# Patient Record
Sex: Female | Born: 1940 | Race: White | Hispanic: No | Marital: Married | Smoking: Never smoker
Health system: Southern US, Community
[De-identification: ages and names within clinical notes are randomized; demographics above are authoritative.]

## PROBLEM LIST (undated history)

## (undated) DIAGNOSIS — C50919 Malignant neoplasm of unspecified site of unspecified female breast: Secondary | ICD-10-CM

## (undated) DIAGNOSIS — I6529 Occlusion and stenosis of unspecified carotid artery: Secondary | ICD-10-CM

## (undated) DIAGNOSIS — N189 Chronic kidney disease, unspecified: Secondary | ICD-10-CM

## (undated) DIAGNOSIS — I2510 Atherosclerotic heart disease of native coronary artery without angina pectoris: Secondary | ICD-10-CM

## (undated) DIAGNOSIS — E119 Type 2 diabetes mellitus without complications: Secondary | ICD-10-CM

## (undated) DIAGNOSIS — I071 Rheumatic tricuspid insufficiency: Secondary | ICD-10-CM

## (undated) DIAGNOSIS — K219 Gastro-esophageal reflux disease without esophagitis: Secondary | ICD-10-CM

## (undated) DIAGNOSIS — I10 Essential (primary) hypertension: Secondary | ICD-10-CM

## (undated) DIAGNOSIS — M1990 Unspecified osteoarthritis, unspecified site: Secondary | ICD-10-CM

## (undated) DIAGNOSIS — R600 Localized edema: Secondary | ICD-10-CM

## (undated) DIAGNOSIS — E039 Hypothyroidism, unspecified: Secondary | ICD-10-CM

## (undated) DIAGNOSIS — E785 Hyperlipidemia, unspecified: Secondary | ICD-10-CM

## (undated) DIAGNOSIS — M8580 Other specified disorders of bone density and structure, unspecified site: Secondary | ICD-10-CM

## (undated) DIAGNOSIS — H269 Unspecified cataract: Secondary | ICD-10-CM

## (undated) DIAGNOSIS — H409 Unspecified glaucoma: Secondary | ICD-10-CM

## (undated) DIAGNOSIS — D696 Thrombocytopenia, unspecified: Secondary | ICD-10-CM

## (undated) DIAGNOSIS — I340 Nonrheumatic mitral (valve) insufficiency: Secondary | ICD-10-CM

## (undated) HISTORY — DX: Atherosclerotic heart disease of native coronary artery without angina pectoris: I25.10

## (undated) HISTORY — PX: COLONOSCOPY: SHX174

## (undated) HISTORY — PX: BREAST LUMPECTOMY: SHX2

## (undated) HISTORY — PX: CARDIAC CATHETERIZATION: SHX172

## (undated) HISTORY — DX: Type 2 diabetes mellitus without complications: E11.9

## (undated) HISTORY — DX: Essential (primary) hypertension: I10

## (undated) HISTORY — PX: SHOULDER SURGERY: SHX246

## (undated) HISTORY — PX: BLADDER SURGERY: SHX569

## (undated) HISTORY — DX: Thrombocytopenia, unspecified: D69.6

## (undated) HISTORY — DX: Chronic kidney disease, unspecified: N18.9

## (undated) HISTORY — PX: CHOLECYSTECTOMY: SHX55

## (undated) HISTORY — PX: TUBAL LIGATION: SHX77

## (undated) HISTORY — DX: Occlusion and stenosis of unspecified carotid artery: I65.29

## (undated) HISTORY — DX: Unspecified osteoarthritis, unspecified site: M19.90

## (undated) HISTORY — PX: APPENDECTOMY: SHX54

## (undated) HISTORY — PX: BREAST SURGERY: SHX581

## (undated) HISTORY — DX: Unspecified glaucoma: H40.9

## (undated) HISTORY — DX: Other specified disorders of bone density and structure, unspecified site: M85.80

## (undated) HISTORY — DX: Unspecified cataract: H26.9

## (undated) HISTORY — DX: Hyperlipidemia, unspecified: E78.5

## (undated) HISTORY — DX: Gastro-esophageal reflux disease without esophagitis: K21.9

## (undated) HISTORY — PX: OTHER SURGICAL HISTORY: SHX169

## (undated) HISTORY — PX: CARPAL TUNNEL RELEASE: SHX101

---

## 1998-10-14 DIAGNOSIS — Z923 Personal history of irradiation: Secondary | ICD-10-CM

## 1998-10-14 DIAGNOSIS — C50919 Malignant neoplasm of unspecified site of unspecified female breast: Secondary | ICD-10-CM

## 1998-10-14 HISTORY — PX: BREAST EXCISIONAL BIOPSY: SUR124

## 1998-10-14 HISTORY — DX: Malignant neoplasm of unspecified site of unspecified female breast: C50.919

## 1998-10-14 HISTORY — DX: Personal history of irradiation: Z92.3

## 2003-10-28 ENCOUNTER — Other Ambulatory Visit: Payer: Self-pay

## 2004-11-01 ENCOUNTER — Ambulatory Visit: Payer: Self-pay | Admitting: Oncology

## 2005-07-01 ENCOUNTER — Ambulatory Visit: Payer: Self-pay | Admitting: General Surgery

## 2005-10-31 ENCOUNTER — Ambulatory Visit: Payer: Self-pay | Admitting: Oncology

## 2006-04-29 ENCOUNTER — Ambulatory Visit: Payer: Self-pay | Admitting: Oncology

## 2006-05-14 ENCOUNTER — Ambulatory Visit: Payer: Self-pay | Admitting: Oncology

## 2006-07-09 ENCOUNTER — Ambulatory Visit: Payer: Self-pay | Admitting: Family Medicine

## 2007-04-24 ENCOUNTER — Ambulatory Visit: Payer: Self-pay | Admitting: Oncology

## 2007-05-15 ENCOUNTER — Ambulatory Visit: Payer: Self-pay | Admitting: Oncology

## 2007-07-13 ENCOUNTER — Ambulatory Visit: Payer: Self-pay | Admitting: Oncology

## 2007-11-25 ENCOUNTER — Ambulatory Visit: Payer: Self-pay | Admitting: Unknown Physician Specialty

## 2008-04-13 ENCOUNTER — Ambulatory Visit: Payer: Self-pay | Admitting: Oncology

## 2008-04-29 ENCOUNTER — Ambulatory Visit: Payer: Self-pay | Admitting: Oncology

## 2008-05-14 ENCOUNTER — Ambulatory Visit: Payer: Self-pay | Admitting: Oncology

## 2008-07-13 ENCOUNTER — Ambulatory Visit: Payer: Self-pay | Admitting: Oncology

## 2009-04-13 ENCOUNTER — Ambulatory Visit: Payer: Self-pay | Admitting: Oncology

## 2009-04-27 ENCOUNTER — Ambulatory Visit: Payer: Self-pay | Admitting: Oncology

## 2009-07-17 ENCOUNTER — Ambulatory Visit: Payer: Self-pay | Admitting: Oncology

## 2010-04-13 ENCOUNTER — Ambulatory Visit: Payer: Self-pay | Admitting: Oncology

## 2010-04-23 ENCOUNTER — Ambulatory Visit: Payer: Self-pay | Admitting: Oncology

## 2010-05-14 ENCOUNTER — Ambulatory Visit: Payer: Self-pay | Admitting: Oncology

## 2010-07-19 ENCOUNTER — Ambulatory Visit: Payer: Self-pay | Admitting: Oncology

## 2011-05-01 ENCOUNTER — Ambulatory Visit: Payer: Self-pay | Admitting: Ophthalmology

## 2011-05-02 ENCOUNTER — Ambulatory Visit: Payer: Self-pay | Admitting: Oncology

## 2011-05-06 ENCOUNTER — Ambulatory Visit: Payer: Self-pay | Admitting: Ophthalmology

## 2011-05-15 ENCOUNTER — Ambulatory Visit: Payer: Self-pay | Admitting: Oncology

## 2011-06-10 ENCOUNTER — Ambulatory Visit: Payer: Self-pay | Admitting: Ophthalmology

## 2011-07-24 ENCOUNTER — Ambulatory Visit: Payer: Self-pay | Admitting: Oncology

## 2012-01-06 ENCOUNTER — Ambulatory Visit: Payer: Self-pay | Admitting: Family Medicine

## 2012-02-04 ENCOUNTER — Ambulatory Visit: Payer: Self-pay | Admitting: Family Medicine

## 2012-07-27 ENCOUNTER — Ambulatory Visit: Payer: Self-pay | Admitting: Family Medicine

## 2013-02-05 ENCOUNTER — Ambulatory Visit: Payer: Self-pay | Admitting: Unknown Physician Specialty

## 2013-02-09 LAB — PATHOLOGY REPORT

## 2013-06-23 ENCOUNTER — Emergency Department: Payer: Self-pay | Admitting: Emergency Medicine

## 2013-08-17 ENCOUNTER — Ambulatory Visit: Payer: Self-pay | Admitting: Family Medicine

## 2013-10-14 HISTORY — PX: JOINT REPLACEMENT: SHX530

## 2014-03-15 ENCOUNTER — Ambulatory Visit: Payer: Self-pay | Admitting: Family Medicine

## 2014-05-23 ENCOUNTER — Ambulatory Visit: Payer: Self-pay | Admitting: Specialist

## 2014-05-23 LAB — BASIC METABOLIC PANEL
Anion Gap: 8 (ref 7–16)
BUN: 21 mg/dL — ABNORMAL HIGH (ref 7–18)
CALCIUM: 8.3 mg/dL — AB (ref 8.5–10.1)
CO2: 24 mmol/L (ref 21–32)
CREATININE: 1.38 mg/dL — AB (ref 0.60–1.30)
Chloride: 109 mmol/L — ABNORMAL HIGH (ref 98–107)
EGFR (African American): 43 — ABNORMAL LOW
GFR CALC NON AF AMER: 37 — AB
Glucose: 139 mg/dL — ABNORMAL HIGH (ref 65–99)
OSMOLALITY: 286 (ref 275–301)
Potassium: 4 mmol/L (ref 3.5–5.1)
Sodium: 141 mmol/L (ref 136–145)

## 2014-05-23 LAB — URINALYSIS, COMPLETE
Bacteria: NONE SEEN
Bilirubin,UR: NEGATIVE
Blood: NEGATIVE
GLUCOSE, UR: NEGATIVE mg/dL (ref 0–75)
KETONE: NEGATIVE
NITRITE: NEGATIVE
PH: 5 (ref 4.5–8.0)
Specific Gravity: 1.018 (ref 1.003–1.030)
WBC UR: 7 /HPF (ref 0–5)

## 2014-05-23 LAB — CBC
HCT: 41 % (ref 35.0–47.0)
HGB: 13.1 g/dL (ref 12.0–16.0)
MCH: 29.6 pg (ref 26.0–34.0)
MCHC: 32 g/dL (ref 32.0–36.0)
MCV: 93 fL (ref 80–100)
Platelet: 80 x10 3/mm 3 — ABNORMAL LOW (ref 150–440)
RBC: 4.43 X10 6/mm 3 (ref 3.80–5.20)
RDW: 14.7 % — ABNORMAL HIGH (ref 11.5–14.5)
WBC: 5 x10 3/mm 3 (ref 3.6–11.0)

## 2014-05-23 LAB — MRSA PCR SCREENING

## 2014-05-23 LAB — PROTIME-INR
INR: 1.1
Prothrombin Time: 14.5 s (ref 11.5–14.7)

## 2014-05-23 LAB — APTT: Activated PTT: 32.9 s (ref 23.6–35.9)

## 2014-05-31 ENCOUNTER — Inpatient Hospital Stay: Payer: Self-pay | Admitting: Specialist

## 2014-06-01 ENCOUNTER — Encounter: Payer: Self-pay | Admitting: Internal Medicine

## 2014-06-01 LAB — CREATININE, SERUM
CREATININE: 1.38 mg/dL — AB (ref 0.60–1.30)
EGFR (African American): 43 — ABNORMAL LOW
EGFR (Non-African Amer.): 37 — ABNORMAL LOW

## 2014-06-01 LAB — HEMOGLOBIN: HGB: 11.4 g/dL — AB (ref 12.0–16.0)

## 2014-06-02 LAB — BASIC METABOLIC PANEL
Anion Gap: 6 — ABNORMAL LOW (ref 7–16)
BUN: 21 mg/dL — ABNORMAL HIGH (ref 7–18)
CALCIUM: 7.5 mg/dL — AB (ref 8.5–10.1)
Chloride: 108 mmol/L — ABNORMAL HIGH (ref 98–107)
Co2: 28 mmol/L (ref 21–32)
Creatinine: 1.32 mg/dL — ABNORMAL HIGH (ref 0.60–1.30)
EGFR (African American): 46 — ABNORMAL LOW
EGFR (Non-African Amer.): 39 — ABNORMAL LOW
Glucose: 147 mg/dL — ABNORMAL HIGH (ref 65–99)
Osmolality: 289 (ref 275–301)
Potassium: 3.7 mmol/L (ref 3.5–5.1)
Sodium: 142 mmol/L (ref 136–145)

## 2014-06-14 ENCOUNTER — Encounter: Payer: Self-pay | Admitting: Internal Medicine

## 2014-08-17 ENCOUNTER — Ambulatory Visit: Payer: Self-pay | Admitting: Family Medicine

## 2014-10-11 DIAGNOSIS — N183 Chronic kidney disease, stage 3 unspecified: Secondary | ICD-10-CM | POA: Insufficient documentation

## 2014-10-11 DIAGNOSIS — I340 Nonrheumatic mitral (valve) insufficiency: Secondary | ICD-10-CM | POA: Insufficient documentation

## 2014-10-11 DIAGNOSIS — I071 Rheumatic tricuspid insufficiency: Secondary | ICD-10-CM | POA: Insufficient documentation

## 2015-02-01 DIAGNOSIS — I10 Essential (primary) hypertension: Secondary | ICD-10-CM | POA: Insufficient documentation

## 2015-02-04 NOTE — Discharge Summary (Signed)
PATIENT NAME:  Julia Nielsen, Julia Nielsen MR#:  [id number] DATE OF BIRTH:  1940/10/15  DATE OF ADMISSION:  05/31/2014 DATE OF DISCHARGE:  06/03/2014  DISCHARGE DIAGNOSES: 1.  Severe degenerative arthritis, right knee.  2.  Hypothyroidism.  3.  Hypertension.  4.  Diabetes mellitus.  5.  History of breast cancer.  6.  Glaucoma.  7.  Cataracts.   OPERATIONS/PROCEDURES PERFORMED: LCS right total knee arthroplasty on 05/31/2014.   PHYSICAL EXAMINATION: As written on admission.   LABORATORY DATA: As noted in the chart.   COURSE IN HOSPITAL: Following informed consent and complete preoperative medical clearance, the patient was taken to the operating room on 05/31/2014 where right total knee arthroplasty was performed without a problem. The patient tolerated the procedure extremely well. Her Hemovac was removed on the first postoperative day and her dressing was changed. Her wound was seen to be healing in quite well with an intact neurovascular examination. She was placed on Lovenox 30 mg b.i.d. subcutaneously as prophylaxis for venous thrombosis and pulmonary embolism. She progressed well in physical therapy with full weight-bearing as tolerated. She is to be transferred to [facility] today, on 06/03/2014. Her orders are as per the order sheet. She is to have physical therapy for ambulation with a walker and range of motion of her knee as per the usual total knee protocol. Please change her bandage as necessary and keep the staples and wound clean and dry. She is to return to the office in 10 to 12 days to see Dr. Tamala Julian for probable staple removal. ____________________________ Lucas Mallow, MD ces:sb D: 06/03/2014 12:35:39 ET T: 06/03/2014 13:24:06 ET JOB#: [id number]  cc: Lucas Mallow, MD, <Dictator> Lucas Mallow MD ELECTRONICALLY SIGNED 06/08/2014 16:49

## 2015-02-04 NOTE — Op Note (Signed)
PATIENT NAME:  Julia Nielsen, Julia Nielsen MR#:  [id number] DATE OF BIRTH:  23-Apr-1941  DATE OF PROCEDURE:  05/31/2014  PREOPERATIVE DIAGNOSIS: Severe degenerative arthritis, right knee.   POSTOPERATIVE DIAGNOSIS: Severe degenerative arthritis, right knee.   PROCEDURE: LCS right total knee arthroplasty.   SURGEON: Christophe Louis, MD.   ASSISTANT: Thornton Park, MD.   ANESTHESIA: General.   COMPLICATIONS: None.   TOURNIQUET TIME: One hour and 20 minutes.   DESCRIPTION OF PROCEDURE: Ancef  2 grams were given intravenously prior to the procedure. General anesthesia is induced. Foley catheter is inserted. The tourniquet is applied to the right thigh. The right leg is thoroughly prepped with alcohol and ChloraPrep and draped in standard sterile fashion. DuraPrep is placed on the knee anteriorly prior to applying the [place]. The extremity is wrapped out with the Esmarch bandage and pneumatic tourniquet elevated to 375 mmHg. A standard anterior longitudinal incision is made and the dissection carried down to the medial and lateral retinaculum which are dissected out with the periosteal elevator. Medial parapatellar incision is then made and the knee is flexed and the patella is reflected laterally. Standard synovial and osteophyte debridement is performed. Retractors are placed and the tibial cutting block is pinned into place. Alignment rod demonstrates satisfactory alignment with the 2 degree varus cutting block. Proximal tibia is cut and all bone and soft tissue removed. Distal femur is sized as a standard and the central drill hole is made in standard fashion. The 10 mm femoral rotation guide is impacted into place and the long rod demonstrates satisfactory alignment. The cutting block is pinned and the anterior and posterior cuts are made. Flexion block put into place demonstrates good stability and satisfactory alignment. A distal femoral cutting block is then impacted into place and pinned. A distal  femoral cut is then made and all bone and soft tissue debris is removed posteriorly. The 12.5 mm extension block was seen to be the best fit and this was stable to stress testing. Alignment was satisfactory. Femoral shaping guide is then impacted into place and the appropriate holes and cuts made. Proximal tibia is sized as a 2-1/2 and the pins and central drill hole made. Trial components are put into place and are seen to be satisfactory with good alignment and good stability. Patella is prepared in the usual manner for a porous-coated patella. All trial components are then removed. The posterior capsule is then carefully injected with 8 mL of EXPAREL. The joint is then thoroughly irrigated multiple times with the pulsatile lavage. The 2.5 tibial tray is then cemented into place. The 12.5 polyethylene rotating platform is put into position and then the porous-coated standard femoral component is impacted into place with a small amount of cement in the distal drill holes and in the intercondylar notch. The knee is extended and there is seen to be good alignment and stability. The standard patellar press-fit component is then press fitted into place and is seen to be stable. Range of motion with the patella demonstrates adequate stability.  The wound is then thoroughly irrigated multiple times again with the pulsatile lavage. Two Autovac drains were brought out through separate stab wound incisions. The medial retinaculum is repaired with multiple 0 Ethibond sutures. Subcutaneous tissue is closed with 2-0 Vicryl. The skin is closed with the skin stapler. A soft bulky dressing is applied along with a Polar Care and a knee immobilizer. The tourniquet is released. The patient is returned to the recovery room in satisfactory condition, having  tolerated the procedure quite well.   ____________________________ Julia Mallow, MD ces:NT D: 05/31/2014 11:50:14 ET T: 05/31/2014 15:19:15  ET JOB#: [id number]  cc: Julia Mallow, MD, <Dictator> Julia Mallow MD ELECTRONICALLY SIGNED 06/08/2014 16:49

## 2015-02-09 DIAGNOSIS — I6523 Occlusion and stenosis of bilateral carotid arteries: Secondary | ICD-10-CM | POA: Insufficient documentation

## 2015-03-27 ENCOUNTER — Ambulatory Visit (INDEPENDENT_AMBULATORY_CARE_PROVIDER_SITE_OTHER): Payer: Medicare Other | Admitting: Family Medicine

## 2015-03-27 ENCOUNTER — Encounter: Payer: Self-pay | Admitting: Family Medicine

## 2015-03-27 VITALS — BP 150/70 | HR 75 | Temp 97.7°F | Ht 61.1 in | Wt 195.0 lb

## 2015-03-27 DIAGNOSIS — M722 Plantar fascial fibromatosis: Secondary | ICD-10-CM

## 2015-03-27 DIAGNOSIS — I10 Essential (primary) hypertension: Secondary | ICD-10-CM | POA: Diagnosis not present

## 2015-03-27 DIAGNOSIS — E785 Hyperlipidemia, unspecified: Secondary | ICD-10-CM | POA: Diagnosis not present

## 2015-03-27 DIAGNOSIS — E1169 Type 2 diabetes mellitus with other specified complication: Secondary | ICD-10-CM | POA: Insufficient documentation

## 2015-03-27 DIAGNOSIS — E119 Type 2 diabetes mellitus without complications: Secondary | ICD-10-CM | POA: Diagnosis not present

## 2015-03-27 LAB — BAYER DCA HB A1C WAIVED: HB A1C (BAYER DCA - WAIVED): 5.8 % (ref <7.0)

## 2015-03-27 NOTE — Assessment & Plan Note (Signed)
The current medical regimen is effective;  continue present plan and medications.  

## 2015-03-27 NOTE — Patient Instructions (Signed)
Plantar Fasciitis  Plantar fasciitis is a common condition that causes foot pain. It is soreness (inflammation) of the band of tough fibrous tissue on the bottom of the foot that runs from the heel bone (calcaneus) to the ball of the foot. The cause of this soreness may be from excessive standing, poor fitting shoes, running on hard surfaces, being overweight, having an abnormal walk, or overuse (this is common in runners) of the painful foot or feet. It is also common in aerobic exercise dancers and ballet dancers.  SYMPTOMS   Most people with plantar fasciitis complain of:   Severe pain in the morning on the bottom of their foot especially when taking the first steps out of bed. This pain recedes after a few minutes of walking.   Severe pain is experienced also during walking following a long period of inactivity.   Pain is worse when walking barefoot or up stairs  DIAGNOSIS    Your caregiver will diagnose this condition by examining and feeling your foot.   Special tests such as X-rays of your foot, are usually not needed.  PREVENTION    Consult a sports medicine professional before beginning a new exercise program.   Walking programs offer a good workout. With walking there is a lower chance of overuse injuries common to runners. There is less impact and less jarring of the joints.   Begin all new exercise programs slowly. If problems or pain develop, decrease the amount of time or distance until you are at a comfortable level.   Wear good shoes and replace them regularly.   Stretch your foot and the heel cords at the back of the ankle (Achilles tendon) both before and after exercise.   Run or exercise on even surfaces that are not hard. For example, asphalt is better than pavement.   Do not run barefoot on hard surfaces.   If using a treadmill, vary the incline.   Do not continue to workout if you have foot or joint problems. Seek professional help if they do not improve.  HOME CARE INSTRUCTIONS     Avoid activities that cause you pain until you recover.   Use ice or cold packs on the problem or painful areas after working out.   Only take over-the-counter or prescription medicines for pain, discomfort, or fever as directed by your caregiver.   Soft shoe inserts or athletic shoes with air or gel sole cushions may be helpful.   If problems continue or become more severe, consult a sports medicine caregiver or your own health care provider. Cortisone is a potent anti-inflammatory medication that may be injected into the painful area. You can discuss this treatment with your caregiver.  MAKE SURE YOU:    Understand these instructions.   Will watch your condition.   Will get help right away if you are not doing well or get worse.  Document Released: 06/25/2001 Document Revised: 12/23/2011 Document Reviewed: 08/24/2008  ExitCare Patient Information 2015 [employer]. This information is not intended to replace advice given to you by your health care provider. Make sure you discuss any questions you have with your health care provider.

## 2015-03-27 NOTE — Progress Notes (Signed)
   BP 150/70 mmHg  Pulse 75  Temp(Src) 97.7 F (36.5 C)  Ht 5' 1.1" (1.552 m)  Wt 195 lb (88.451 kg)  BMI 36.72 kg/m2  SpO2 98%   Subjective:    Patient ID: [name], female    DOB: 1940/12/09, 74 y.o.   MRN: [id number]  HPI: STEFANIA GOULART is a 74 y.o. female  Chief Complaint  Patient presents with  . Diabetes  . Foot Pain    Right heel   DM doing well no hypoglycemia spells No side effects meds Glu around 100  plantar fascitis sx rt heel  BP stable  Lipids stable  meds doing well no side effects Thyroid stable  Relevant past medical, surgical, family and social history reviewed and updated as indicated. Interim medical history since our last visit reviewed. Allergies and medications reviewed and updated.  Review of Systems  Constitutional: Negative.   Respiratory: Negative.   Cardiovascular: Negative.     Per HPI unless specifically indicated above     Objective:    BP 150/70 mmHg  Pulse 75  Temp(Src) 97.7 F (36.5 C)  Ht 5' 1.1" (1.552 m)  Wt 195 lb (88.451 kg)  BMI 36.72 kg/m2  SpO2 98%  Wt Readings from Last 3 Encounters:  03/27/15 195 lb (88.451 kg)  12/08/14 195 lb (88.451 kg)    Physical Exam  Constitutional: She is oriented to person, place, and time. She appears well-developed and well-nourished. No distress.  HENT:  Head: Normocephalic and atraumatic.  Right Ear: Hearing normal.  Left Ear: Hearing normal.  Nose: Nose normal.  Eyes: Conjunctivae and lids are normal. Right eye exhibits no discharge. Left eye exhibits no discharge. No scleral icterus.  Cardiovascular: Normal rate, regular rhythm and normal heart sounds.   Pulmonary/Chest: Effort normal and breath sounds normal. No respiratory distress.  Musculoskeletal: Normal range of motion.  Neurological: She is alert and oriented to person, place, and time.  Skin: Skin is intact. No rash noted.  Psychiatric: She has a normal mood and affect. Her speech is normal and behavior is  normal. Judgment and thought content normal. Cognition and memory are normal.        Assessment & Plan:   Problem List Items Addressed This Visit      Cardiovascular and Mediastinum   Hypertension    The current medical regimen is effective;  continue present plan and medications.         Endocrine   Diabetes mellitus without complication - Primary    .The current medical regimen is effective;  continue present plan and medications.      Relevant Orders   Bayer DCA Hb A1c Waived     Other   Hyperlipidemia    The current medical regimen is effective;  continue present plan and medications.          Follow up plan: No Follow-up on file.

## 2015-05-02 ENCOUNTER — Telehealth: Payer: Self-pay

## 2015-05-02 MED ORDER — GLIPIZIDE 5 MG PO TABS: 5.0000 mg | ORAL_TABLET | Freq: Every day | ORAL | Status: DC

## 2015-05-02 MED ORDER — AMLODIPINE BESYLATE 10 MG PO TABS: 10.0000 mg | ORAL_TABLET | Freq: Every day | ORAL | Status: DC

## 2015-05-02 MED ORDER — LOVASTATIN 40 MG PO TABS: 40.0000 mg | ORAL_TABLET | Freq: Every day | ORAL | Status: DC

## 2015-05-02 NOTE — Telephone Encounter (Signed)
Lovastatin 40mg  Tab QHS Glipizide 5mg  Tab QD Amlodipine 10mg  Tab QD  #90

## 2015-05-11 ENCOUNTER — Other Ambulatory Visit: Payer: Self-pay | Admitting: Family Medicine

## 2015-05-11 NOTE — Telephone Encounter (Signed)
Pt came in and stated that she was gonna run out of medication the end of next week and needs [facility]ill [facility]. The medications she needs are lovastatin, glipizide, benzapril and amlodipine

## 2015-05-11 NOTE — Telephone Encounter (Signed)
All of meds requested below except Benazepril were written 05/02/15 but sent to [facility], can you write 90 days and send to [facility] Please

## 2015-05-15 MED ORDER — BENAZEPRIL HCL 40 MG PO TABS: 40.0000 mg | ORAL_TABLET | Freq: Every day | ORAL | Status: DC

## 2015-05-15 MED ORDER — LOVASTATIN 40 MG PO TABS: 40.0000 mg | ORAL_TABLET | Freq: Every day | ORAL | Status: DC

## 2015-05-15 MED ORDER — AMLODIPINE BESYLATE 10 MG PO TABS: 10.0000 mg | ORAL_TABLET | Freq: Every day | ORAL | Status: DC

## 2015-05-15 MED ORDER — GLIPIZIDE 5 MG PO TABS: 5.0000 mg | ORAL_TABLET | Freq: Every day | ORAL | Status: DC

## 2015-05-15 NOTE — Telephone Encounter (Signed)
Rxs sent to [facility]

## 2015-07-05 ENCOUNTER — Ambulatory Visit (INDEPENDENT_AMBULATORY_CARE_PROVIDER_SITE_OTHER): Payer: Medicare Other | Admitting: Family Medicine

## 2015-07-05 ENCOUNTER — Encounter: Payer: Self-pay | Admitting: Family Medicine

## 2015-07-05 VITALS — BP 151/76 | HR 62 | Temp 97.9°F | Ht 60.5 in | Wt 194.0 lb

## 2015-07-05 DIAGNOSIS — N189 Chronic kidney disease, unspecified: Secondary | ICD-10-CM

## 2015-07-05 DIAGNOSIS — N183 Chronic kidney disease, stage 3 unspecified: Secondary | ICD-10-CM

## 2015-07-05 DIAGNOSIS — D631 Anemia in chronic kidney disease: Secondary | ICD-10-CM | POA: Diagnosis not present

## 2015-07-05 DIAGNOSIS — E785 Hyperlipidemia, unspecified: Secondary | ICD-10-CM

## 2015-07-05 DIAGNOSIS — N2581 Secondary hyperparathyroidism of renal origin: Secondary | ICD-10-CM

## 2015-07-05 DIAGNOSIS — I2510 Atherosclerotic heart disease of native coronary artery without angina pectoris: Secondary | ICD-10-CM | POA: Diagnosis not present

## 2015-07-05 DIAGNOSIS — Z23 Encounter for immunization: Secondary | ICD-10-CM | POA: Diagnosis not present

## 2015-07-05 DIAGNOSIS — E1122 Type 2 diabetes mellitus with diabetic chronic kidney disease: Secondary | ICD-10-CM | POA: Insufficient documentation

## 2015-07-05 DIAGNOSIS — I129 Hypertensive chronic kidney disease with stage 1 through stage 4 chronic kidney disease, or unspecified chronic kidney disease: Secondary | ICD-10-CM

## 2015-07-05 DIAGNOSIS — I10 Essential (primary) hypertension: Secondary | ICD-10-CM

## 2015-07-05 DIAGNOSIS — E119 Type 2 diabetes mellitus without complications: Secondary | ICD-10-CM | POA: Diagnosis not present

## 2015-07-05 LAB — LP+ALT+AST PICCOLO, WAIVED
ALT (SGPT) Piccolo, Waived: 25 U/L (ref 10–47)
AST (SGOT) Piccolo, Waived: 31 U/L (ref 11–38)
CHOL/HDL RATIO PICCOLO,WAIVE: 2.7 mg/dL
Cholesterol Piccolo, Waived: 133 mg/dL (ref <200)
HDL Chol Piccolo, Waived: 49 mg/dL — ABNORMAL LOW (ref >59)
LDL Chol Calc Piccolo Waived: 48 mg/dL (ref <100)
TRIGLYCERIDES PICCOLO,WAIVED: 180 mg/dL — AB (ref <150)
VLDL CHOL CALC PICCOLO,WAIVE: 36 mg/dL — AB (ref <30)

## 2015-07-05 LAB — BAYER DCA HB A1C WAIVED: HB A1C (BAYER DCA - WAIVED): 6.4 % (ref <7.0)

## 2015-07-05 NOTE — Assessment & Plan Note (Signed)
Stable

## 2015-07-05 NOTE — Assessment & Plan Note (Signed)
Diabetes stable kidney disease followed by nephrology

## 2015-07-05 NOTE — Assessment & Plan Note (Signed)
Followed by cardiology 

## 2015-07-05 NOTE — Assessment & Plan Note (Signed)
The current medical regimen is effective;  continue present plan and medications.  

## 2015-07-05 NOTE — Progress Notes (Signed)
BP 151/76 mmHg  Pulse 62  Temp(Src) 97.9 F (36.6 C)  Ht 5' 0.5" (1.537 m)  Wt 194 lb (87.998 kg)  BMI 37.25 kg/m2  SpO2 99%   Subjective:    Patient ID: Julia Nielsen, female    DOB: [birth year], 74 y.o.   MRN: [id number]  HPI: Julia Nielsen is a 74 y.o. female  Chief Complaint  Patient presents with  . Diabetes  . Hyperlipidemia  . Hypertension    follow-up medical conditions doing well for blood pressure good control when last checked cardiology last month patient's taking her medications. No side effects from blood pressure medications  Diabetes good control no low blood sugar spells takes medications faithfully without side effects.  Cholesterol doing well with no side effects as is osteoporosis medication Evista without side effects  Relevant past medical, surgical, family and social history reviewed and updated as indicated. Interim medical history since our last visit reviewed. Allergies and medications reviewed and updated.  Review of Systems  Constitutional: Negative.   Respiratory: Negative.   Cardiovascular: Negative.     Per HPI unless specifically indicated above     Objective:    BP 151/76 mmHg  Pulse 62  Temp(Src) 97.9 F (36.6 C)  Ht 5' 0.5" (1.537 m)  Wt 194 lb (87.998 kg)  BMI 37.25 kg/m2  SpO2 99%  Wt Readings from Last 3 Encounters:  07/05/15 194 lb (87.998 kg)  03/27/15 195 lb (88.451 kg)  12/08/14 195 lb (88.451 kg)    Physical Exam  Constitutional: She is oriented to person, place, and time. She appears well-developed and well-nourished. No distress.  HENT:  Head: Normocephalic and atraumatic.  Right Ear: Hearing normal.  Left Ear: Hearing normal.  Nose: Nose normal.  Eyes: Conjunctivae and lids are normal. Right eye exhibits no discharge. Left eye exhibits no discharge. No scleral icterus.  Cardiovascular: Normal rate, regular rhythm and normal heart sounds.   Pulmonary/Chest: Effort normal and breath sounds normal. No  respiratory distress.  Musculoskeletal: Normal range of motion.  Neurological: She is alert and oriented to person, place, and time.  Skin: Skin is intact. No rash noted.  Psychiatric: She has a normal mood and affect. Her speech is normal and behavior is normal. Judgment and thought content normal. Cognition and memory are normal.    Results for orders placed or performed in visit on 03/27/15  Bayer DCA Hb A1c Waived  Result Value Ref Range   Bayer DCA Hb A1c Waived 5.8 <7.0 %      Assessment & Plan:   Problem List Items Addressed This Visit      Cardiovascular and Mediastinum   Hypertension    ........Marland KitchenMarland KitchenThe current medical regimen is effective;  continue present plan and medications.       CAD (coronary artery disease)    Followed by cardiology        Endocrine   Diabetes mellitus without complication - Primary   Relevant Orders   Bayer DCA Hb A1c Waived   LP+ALT+AST Piccolo, Waived   Basic metabolic panel   Type 2 diabetes mellitus with stage 3 chronic kidney disease    Diabetes stable kidney disease followed by nephrology      Secondary hyperparathyroidism of renal origin     Genitourinary   Benign hypertensive renal disease   Anemia of chronic renal failure    Stable        Other Visit Diagnoses    Essential hypertension, benign  Relevant Orders    Bayer DCA Hb A1c Waived    LP+ALT+AST Piccolo, Waived    Basic metabolic panel    Hyperlipemia        Relevant Orders    Bayer DCA Hb A1c Waived    LP+ALT+AST Piccolo, Waived    Basic metabolic panel    Immunization due        Relevant Orders    Flu Vaccine QUAD 36+ mos PF IM (Fluarix & Fluzone Quad PF) (Completed)        Follow up plan: Return in about 3 months (around 10/04/2015), or if symptoms worsen or fail to improve, for Recheck diabetes and hemoglobin A1c, Physical Exam.

## 2015-07-06 ENCOUNTER — Encounter: Payer: Self-pay | Admitting: Family Medicine

## 2015-07-06 LAB — BASIC METABOLIC PANEL
BUN/Creatinine Ratio: 16 (ref 11–26)
BUN: 20 mg/dL (ref 8–27)
CHLORIDE: 103 mmol/L (ref 97–108)
CO2: 21 mmol/L (ref 18–29)
Calcium: 8.9 mg/dL (ref 8.7–10.3)
Creatinine, Ser: 1.27 mg/dL — ABNORMAL HIGH (ref 0.57–1.00)
GFR calc Af Amer: 47 mL/min/{1.73_m2} — ABNORMAL LOW (ref >59)
GFR calc non Af Amer: 41 mL/min/{1.73_m2} — ABNORMAL LOW (ref >59)
GLUCOSE: 181 mg/dL — AB (ref 65–99)
POTASSIUM: 3.9 mmol/L (ref 3.5–5.2)
SODIUM: 144 mmol/L (ref 134–144)

## 2015-07-31 ENCOUNTER — Other Ambulatory Visit: Payer: Self-pay | Admitting: Family Medicine

## 2015-10-03 ENCOUNTER — Other Ambulatory Visit: Payer: Self-pay

## 2015-10-03 MED ORDER — BENAZEPRIL HCL 40 MG PO TABS: 40.0000 mg | ORAL_TABLET | Freq: Every day | ORAL | Status: DC

## 2015-10-03 MED ORDER — AMLODIPINE BESYLATE 10 MG PO TABS: 10.0000 mg | ORAL_TABLET | Freq: Every day | ORAL | Status: DC

## 2015-10-03 NOTE — Telephone Encounter (Signed)
Last Visit: 07/05/2015 Next Appt: 12/11/2015  Request for benazepril 40mg  and amlodipine 10mg 

## 2015-10-06 ENCOUNTER — Telehealth: Payer: Self-pay

## 2015-10-06 NOTE — Telephone Encounter (Signed)
Refill request for the following: Norvasc 10mg  Glucotrol 5mg  Mevacor 40mg   [facility]

## 2015-10-10 MED ORDER — AMLODIPINE BESYLATE 10 MG PO TABS: 10.0000 mg | ORAL_TABLET | Freq: Every day | ORAL | Status: DC

## 2015-10-10 MED ORDER — LOVASTATIN 40 MG PO TABS: 40.0000 mg | ORAL_TABLET | Freq: Every day | ORAL | Status: DC

## 2015-10-10 MED ORDER — GLIPIZIDE 5 MG PO TABS: 5.0000 mg | ORAL_TABLET | Freq: Every day | ORAL | Status: DC

## 2015-10-10 NOTE — Telephone Encounter (Signed)
Refills sent to his pharmacy. 

## 2015-12-11 ENCOUNTER — Encounter: Payer: Self-pay | Admitting: Family Medicine

## 2015-12-11 ENCOUNTER — Ambulatory Visit (INDEPENDENT_AMBULATORY_CARE_PROVIDER_SITE_OTHER): Payer: PPO | Admitting: Family Medicine

## 2015-12-11 VITALS — BP 136/74 | HR 71 | Temp 97.7°F | Ht 61.0 in | Wt 190.6 lb

## 2015-12-11 DIAGNOSIS — N183 Chronic kidney disease, stage 3 (moderate): Secondary | ICD-10-CM

## 2015-12-11 DIAGNOSIS — I2510 Atherosclerotic heart disease of native coronary artery without angina pectoris: Secondary | ICD-10-CM

## 2015-12-11 DIAGNOSIS — M8580 Other specified disorders of bone density and structure, unspecified site: Secondary | ICD-10-CM

## 2015-12-11 DIAGNOSIS — J309 Allergic rhinitis, unspecified: Secondary | ICD-10-CM | POA: Diagnosis not present

## 2015-12-11 DIAGNOSIS — I10 Essential (primary) hypertension: Secondary | ICD-10-CM | POA: Diagnosis not present

## 2015-12-11 DIAGNOSIS — N2581 Secondary hyperparathyroidism of renal origin: Secondary | ICD-10-CM

## 2015-12-11 DIAGNOSIS — Z1231 Encounter for screening mammogram for malignant neoplasm of breast: Secondary | ICD-10-CM

## 2015-12-11 DIAGNOSIS — E1122 Type 2 diabetes mellitus with diabetic chronic kidney disease: Secondary | ICD-10-CM | POA: Diagnosis not present

## 2015-12-11 DIAGNOSIS — E785 Hyperlipidemia, unspecified: Secondary | ICD-10-CM | POA: Diagnosis not present

## 2015-12-11 DIAGNOSIS — Z0000 Encounter for general adult medical examination without abnormal findings: Secondary | ICD-10-CM | POA: Diagnosis not present

## 2015-12-11 DIAGNOSIS — E039 Hypothyroidism, unspecified: Secondary | ICD-10-CM | POA: Insufficient documentation

## 2015-12-11 LAB — URINALYSIS, ROUTINE W REFLEX MICROSCOPIC
BILIRUBIN UA: NEGATIVE
Glucose, UA: NEGATIVE
Ketones, UA: NEGATIVE
Nitrite, UA: NEGATIVE
PH UA: 5 (ref 5.0–7.5)
RBC UA: NEGATIVE
Specific Gravity, UA: 1.01 (ref 1.005–1.030)
Urobilinogen, Ur: 0.2 mg/dL (ref 0.2–1.0)

## 2015-12-11 LAB — MICROSCOPIC EXAMINATION

## 2015-12-11 LAB — BAYER DCA HB A1C WAIVED: HB A1C (BAYER DCA - WAIVED): 7.5 % — ABNORMAL HIGH (ref <7.0)

## 2015-12-11 MED ORDER — GLIPIZIDE 5 MG PO TABS: 5.0000 mg | ORAL_TABLET | Freq: Every day | ORAL | Status: DC

## 2015-12-11 MED ORDER — LOVASTATIN 40 MG PO TABS: 40.0000 mg | ORAL_TABLET | Freq: Every day | ORAL | Status: DC

## 2015-12-11 MED ORDER — FLUTICASONE PROPIONATE 50 MCG/ACT NA SUSP: 2.0000 | Freq: Every day | NASAL | Status: DC

## 2015-12-11 MED ORDER — RALOXIFENE HCL 60 MG PO TABS: ORAL_TABLET | ORAL | Status: DC

## 2015-12-11 MED ORDER — CARVEDILOL 25 MG PO TABS: 12.5000 mg | ORAL_TABLET | Freq: Two times a day (BID) | ORAL | Status: DC

## 2015-12-11 MED ORDER — BENAZEPRIL HCL 40 MG PO TABS: 40.0000 mg | ORAL_TABLET | Freq: Every day | ORAL | Status: DC

## 2015-12-11 MED ORDER — LEVOTHYROXINE SODIUM 150 MCG PO TABS: 150.0000 ug | ORAL_TABLET | Freq: Every day | ORAL | Status: DC

## 2015-12-11 MED ORDER — AMLODIPINE BESYLATE 10 MG PO TABS: 10.0000 mg | ORAL_TABLET | Freq: Every day | ORAL | Status: DC

## 2015-12-11 NOTE — Assessment & Plan Note (Signed)
The current medical regimen is effective;  continue present plan and medications.  

## 2015-12-11 NOTE — Assessment & Plan Note (Signed)
Discuss treatment to get off Afrin-like nose spray start Flonase Allegra nasal rinses and Mucinex. Afrin-like nose spray in one week

## 2015-12-11 NOTE — Assessment & Plan Note (Signed)
Followed by nephrology. 

## 2015-12-11 NOTE — Progress Notes (Signed)
BP 136/74 mmHg  Pulse 71  Temp(Src) 97.7 F (36.5 C)  Ht '5\' 1"'$  (1.549 m)  Wt 190 lb 9.6 oz (86.456 kg)  BMI 36.03 kg/m2  SpO2 97%   Subjective:    Patient ID: Julia Nielsen, female    DOB: [birth year], 75 y.o.   MRN: [id number]  HPI: Julia Nielsen is a 75 y.o. female  Chief Complaint  Patient presents with  . Annual Exam  . Diabetes    need Rx for new Meter (accu chek or one touch)  . URI    sinus congestion, taking OTC meds   patient with long-standing sinus congestion drainage spent in using over-the-counter decongestants and nose spray once a day which is all lost effectiveness hasn't really been sick with fever or chills just a lot of congestion. Patient's not sure what medication as it's [facility] branded over-the-counter sprays. Does sound like some kind of Afrin nose spray. Patient's meter has been broken needs a new one isn't aware of what her blood sugars been doing prior to that was doing just fine No low blood sugar spells Blood pressures been doing well no complaints from medications cholesterol doing well good reports from kidney doctor Also good reports from heart doctor  Medicare metrics met for AWV Relevant past medical, surgical, family and social history reviewed and updated as indicated. Interim medical history since our last visit reviewed. Allergies and medications reviewed and updated.  Review of Systems  Constitutional: Negative.   HENT: Negative.   Eyes: Negative.   Respiratory: Negative.   Cardiovascular: Negative.   Gastrointestinal: Negative.   Endocrine: Negative.   Genitourinary: Negative.   Musculoskeletal: Negative.   Skin: Negative.   Allergic/Immunologic: Negative.   Neurological: Negative.   Hematological: Negative.   Psychiatric/Behavioral: Negative.     Per HPI unless specifically indicated above     Objective:    BP 136/74 mmHg  Pulse 71  Temp(Src) 97.7 F (36.5 C)  Ht '5\' 1"'$  (1.549 m)  Wt 190 lb 9.6 oz (86.456 kg)   BMI 36.03 kg/m2  SpO2 97%  Wt Readings from Last 3 Encounters:  12/11/15 190 lb 9.6 oz (86.456 kg)  07/05/15 194 lb (87.998 kg)  03/27/15 195 lb (88.451 kg)    Physical Exam  Constitutional: She is oriented to person, place, and time. She appears well-developed and well-nourished.  HENT:  Head: Normocephalic and atraumatic.  Right Ear: External ear normal.  Left Ear: External ear normal.  Nose: Nose normal.  Mouth/Throat: Oropharynx is clear and moist.  Eyes: Conjunctivae and EOM are normal. Pupils are equal, round, and reactive to light.  Neck: Normal range of motion. Neck supple. Carotid bruit is not present.  Cardiovascular: Normal rate, regular rhythm and normal heart sounds.   No murmur heard. Pulmonary/Chest: Effort normal and breath sounds normal. She exhibits no mass. Right breast exhibits no mass, no skin change and no tenderness. Left breast exhibits no mass, no skin change and no tenderness. Breasts are symmetrical.  Abdominal: Soft. Bowel sounds are normal. There is no hepatosplenomegaly.  Musculoskeletal: Normal range of motion.  Neurological: She is alert and oriented to person, place, and time.  Skin: No rash noted.  Psychiatric: She has a normal mood and affect. Her behavior is normal. Judgment and thought content normal.    Results for orders placed or performed in visit on 07/05/15  Bayer DCA Hb A1c Waived  Result Value Ref Range   Bayer DCA Hb A1c Waived 6.4 <7.0 %  LP+ALT+AST Piccolo, [employer]  Result Value Ref Range   ALT (SGPT) Piccolo, Waived 25 10 - 47 U/L   AST (SGOT) Piccolo, Waived 31 11 - 38 U/L   Cholesterol Piccolo, Waived 133 <200 mg/dL   HDL Chol Piccolo, Waived 49 (L) >59 mg/dL   Triglycerides Piccolo,Waived 180 (H) <150 mg/dL   Chol/HDL Ratio Piccolo,Waive 2.7 mg/dL   LDL Chol Calc Piccolo Waived 48 <100 mg/dL   VLDL Chol Calc Piccolo,Waive 36 (H) <30 mg/dL  Basic metabolic panel  Result Value Ref Range   Glucose 181 (H) 65 - 99 mg/dL   BUN  20 8 - 27 mg/dL   Creatinine, Ser 1.47 (H) 0.57 - 1.00 mg/dL   GFR calc non Af Amer 41 (L) >59 mL/min/1.73   GFR calc Af Amer 47 (L) >59 mL/min/1.73   BUN/Creatinine Ratio 16 11 - 26   Sodium 144 134 - 144 mmol/L   Potassium 3.9 3.5 - 5.2 mmol/L   Chloride 103 97 - 108 mmol/L   CO2 21 18 - 29 mmol/L   Calcium 8.9 8.7 - 10.3 mg/dL      Assessment & Plan:   Problem List Items Addressed This Visit      Cardiovascular and Mediastinum   Hypertension    The current medical regimen is effective;  continue present plan and medications.       Relevant Medications   amLODipine (NORVASC) 10 MG tablet   benazepril (LOTENSIN) 40 MG tablet   carvedilol (COREG) 25 MG tablet   lovastatin (MEVACOR) 40 MG tablet   Other Relevant Orders   Comprehensive metabolic panel   CBC with Differential/Platelet   TSH   Urinalysis, Routine w reflex microscopic (not at [facility])   CAD (coronary artery disease)    The current medical regimen is effective;  continue present plan and medications.       Relevant Medications   amLODipine (NORVASC) 10 MG tablet   benazepril (LOTENSIN) 40 MG tablet   carvedilol (COREG) 25 MG tablet   lovastatin (MEVACOR) 40 MG tablet   Other Relevant Orders   Comprehensive metabolic panel   CBC with Differential/Platelet   TSH   Urinalysis, Routine w reflex microscopic (not at [facility])     Respiratory   Multiple respiratory allergies    Discuss treatment to get off Afrin-like nose spray start Flonase Allegra nasal rinses and Mucinex. Afrin-like nose spray in one week      Relevant Medications   fluticasone (FLONASE) 50 MCG/ACT nasal spray     Endocrine   Type 2 diabetes mellitus with stage 3 chronic kidney disease (HCC)    The current medical regimen is effective;  continue present plan and medications.       Relevant Medications   benazepril (LOTENSIN) 40 MG tablet   glipiZIDE (GLUCOTROL) 5 MG tablet   lovastatin (MEVACOR) 40 MG tablet   Other Relevant Orders    Bayer DCA Hb A1c Waived   Comprehensive metabolic panel   CBC with Differential/Platelet   TSH   Urinalysis, Routine w reflex microscopic (not at [facility]   Secondary hyperparathyroidism of renal origin (HCC)    Followed by nephrology      Hypothyroidism    The current medical regimen is effective;  continue present plan and medications.       Relevant Medications   carvedilol (COREG) 25 MG tablet   levothyroxine (SYNTHROID, LEVOTHROID) 150 MCG tablet     Musculoskeletal and Integument   Osteopenia    The current  medical regimen is effective;  continue present plan and medications.       Relevant Medications   raloxifene (EVISTA) 60 MG tablet     Other   Hyperlipidemia    The current medical regimen is effective;  continue present plan and medications.       Relevant Medications   amLODipine (NORVASC) 10 MG tablet   benazepril (LOTENSIN) 40 MG tablet   carvedilol (COREG) 25 MG tablet   lovastatin (MEVACOR) 40 MG tablet   Other Relevant Orders   Lipid panel   CBC with Differential/Platelet   TSH   Urinalysis, Routine w reflex microscopic (not at [facility])    Other Visit Diagnoses    Encounter for screening mammogram for breast cancer    -  Primary    Relevant Orders    MM Digital Screening    PE (physical exam), annual            Follow up plan: Return in about 6 months (around 06/09/2016) for a1c , lipids, alt, ast, BMP.

## 2015-12-12 ENCOUNTER — Other Ambulatory Visit: Payer: Self-pay | Admitting: Family Medicine

## 2015-12-12 DIAGNOSIS — E1122 Type 2 diabetes mellitus with diabetic chronic kidney disease: Secondary | ICD-10-CM

## 2015-12-12 DIAGNOSIS — N183 Chronic kidney disease, stage 3 (moderate): Principal | ICD-10-CM

## 2015-12-12 LAB — COMPREHENSIVE METABOLIC PANEL
A/G RATIO: 1.3 (ref 1.1–2.5)
ALBUMIN: 4 g/dL (ref 3.5–4.8)
ALK PHOS: 52 IU/L (ref 39–117)
ALT: 32 IU/L (ref 0–32)
AST: 32 IU/L (ref 0–40)
BILIRUBIN TOTAL: 0.7 mg/dL (ref 0.0–1.2)
BUN / CREAT RATIO: 13 (ref 11–26)
BUN: 18 mg/dL (ref 8–27)
CHLORIDE: 101 mmol/L (ref 96–106)
CO2: 20 mmol/L (ref 18–29)
Calcium: 9.3 mg/dL (ref 8.7–10.3)
Creatinine, Ser: 1.37 mg/dL — ABNORMAL HIGH (ref 0.57–1.00)
GFR calc non Af Amer: 37 mL/min/{1.73_m2} — ABNORMAL LOW (ref >59)
GFR, EST AFRICAN AMERICAN: 43 mL/min/{1.73_m2} — AB (ref >59)
GLOBULIN, TOTAL: 3.1 g/dL (ref 1.5–4.5)
GLUCOSE: 262 mg/dL — AB (ref 65–99)
POTASSIUM: 3.7 mmol/L (ref 3.5–5.2)
Sodium: 142 mmol/L (ref 134–144)
TOTAL PROTEIN: 7.1 g/dL (ref 6.0–8.5)

## 2015-12-12 LAB — LIPID PANEL
CHOL/HDL RATIO: 2.8 ratio (ref 0.0–4.4)
Cholesterol, Total: 148 mg/dL (ref 100–199)
HDL: 53 mg/dL (ref >39)
LDL Calculated: 57 mg/dL (ref 0–99)
Triglycerides: 190 mg/dL — ABNORMAL HIGH (ref 0–149)
VLDL CHOLESTEROL CAL: 38 mg/dL (ref 5–40)

## 2015-12-12 LAB — TSH: TSH: 3.6 u[IU]/mL (ref 0.450–4.500)

## 2015-12-12 LAB — CBC WITH DIFFERENTIAL/PLATELET
BASOS ABS: 0 10*3/uL (ref 0.0–0.2)
Basos: 0 %
EOS (ABSOLUTE): 0.2 10*3/uL (ref 0.0–0.4)
EOS: 3 %
HEMATOCRIT: 42.2 % (ref 34.0–46.6)
Hemoglobin: 13.9 g/dL (ref 11.1–15.9)
IMMATURE GRANULOCYTES: 0 %
Immature Grans (Abs): 0 10*3/uL (ref 0.0–0.1)
LYMPHS ABS: 1.9 10*3/uL (ref 0.7–3.1)
Lymphs: 27 %
MCH: 29.8 pg (ref 26.6–33.0)
MCHC: 32.9 g/dL (ref 31.5–35.7)
MCV: 90 fL (ref 79–97)
MONOCYTES: 8 %
MONOS ABS: 0.6 10*3/uL (ref 0.1–0.9)
NEUTROS PCT: 62 %
Neutrophils Absolute: 4.5 10*3/uL (ref 1.4–7.0)
Platelets: 133 10*3/uL — ABNORMAL LOW (ref 150–379)
RBC: 4.67 x10E6/uL (ref 3.77–5.28)
RDW: 14.5 % (ref 12.3–15.4)
WBC: 7.2 10*3/uL (ref 3.4–10.8)

## 2015-12-12 MED ORDER — BLOOD GLUCOSE TEST VI STRP: 1.0000 | ORAL_STRIP | Freq: Every day | Status: DC

## 2015-12-13 ENCOUNTER — Telehealth: Payer: Self-pay | Admitting: Family Medicine

## 2015-12-13 NOTE — Telephone Encounter (Signed)
-----   Message from Wynn Maudlin, [place] sent at 12/13/2015  4:55 PM EST ----- labs

## 2015-12-13 NOTE — Telephone Encounter (Signed)
Phone call Discussed with patient renal function all in all stable discuss cautions and precautions Otherwise continue current medications

## 2015-12-14 ENCOUNTER — Telehealth: Payer: Self-pay | Admitting: Family Medicine

## 2015-12-14 NOTE — Telephone Encounter (Signed)
[facility] called new RX for pt's meter and lancets. Request that RX's are E Scribed:   One Touch Ultra One Touch Delica Or generic   Thanks.

## 2015-12-18 ENCOUNTER — Other Ambulatory Visit: Payer: Self-pay | Admitting: Family Medicine

## 2015-12-18 MED ORDER — LANCETS MISC: Status: DC

## 2016-01-16 DIAGNOSIS — N2889 Other specified disorders of kidney and ureter: Secondary | ICD-10-CM | POA: Diagnosis not present

## 2016-01-16 DIAGNOSIS — I129 Hypertensive chronic kidney disease with stage 1 through stage 4 chronic kidney disease, or unspecified chronic kidney disease: Secondary | ICD-10-CM | POA: Diagnosis not present

## 2016-01-16 DIAGNOSIS — N183 Chronic kidney disease, stage 3 (moderate): Secondary | ICD-10-CM | POA: Diagnosis not present

## 2016-01-16 DIAGNOSIS — E1122 Type 2 diabetes mellitus with diabetic chronic kidney disease: Secondary | ICD-10-CM | POA: Diagnosis not present

## 2016-01-16 DIAGNOSIS — R809 Proteinuria, unspecified: Secondary | ICD-10-CM | POA: Diagnosis not present

## 2016-01-17 DIAGNOSIS — H401131 Primary open-angle glaucoma, bilateral, mild stage: Secondary | ICD-10-CM | POA: Diagnosis not present

## 2016-01-24 DIAGNOSIS — H401131 Primary open-angle glaucoma, bilateral, mild stage: Secondary | ICD-10-CM | POA: Diagnosis not present

## 2016-02-07 DIAGNOSIS — I10 Essential (primary) hypertension: Secondary | ICD-10-CM | POA: Diagnosis not present

## 2016-02-07 DIAGNOSIS — E782 Mixed hyperlipidemia: Secondary | ICD-10-CM | POA: Diagnosis not present

## 2016-02-07 DIAGNOSIS — I6523 Occlusion and stenosis of bilateral carotid arteries: Secondary | ICD-10-CM | POA: Diagnosis not present

## 2016-02-07 DIAGNOSIS — E669 Obesity, unspecified: Secondary | ICD-10-CM | POA: Diagnosis not present

## 2016-02-07 DIAGNOSIS — E1021 Type 1 diabetes mellitus with diabetic nephropathy: Secondary | ICD-10-CM | POA: Diagnosis not present

## 2016-03-19 DIAGNOSIS — H0289 Other specified disorders of eyelid: Secondary | ICD-10-CM | POA: Diagnosis not present

## 2016-03-26 DIAGNOSIS — N2889 Other specified disorders of kidney and ureter: Secondary | ICD-10-CM | POA: Diagnosis not present

## 2016-05-24 ENCOUNTER — Other Ambulatory Visit: Payer: Self-pay | Admitting: Family Medicine

## 2016-05-24 ENCOUNTER — Ambulatory Visit
Admission: RE | Admit: 2016-05-24 | Discharge: 2016-05-24 | Disposition: A | Discharge status: Home / Self Care | Payer: PPO | Source: Ambulatory Visit | Attending: Family Medicine | Admitting: Family Medicine

## 2016-05-24 DIAGNOSIS — Z1231 Encounter for screening mammogram for malignant neoplasm of breast: Secondary | ICD-10-CM

## 2016-05-24 DIAGNOSIS — N6489 Other specified disorders of breast: Secondary | ICD-10-CM

## 2016-05-24 HISTORY — DX: Malignant neoplasm of unspecified site of unspecified female breast: C50.919

## 2016-05-29 DIAGNOSIS — H401131 Primary open-angle glaucoma, bilateral, mild stage: Secondary | ICD-10-CM | POA: Diagnosis not present

## 2016-05-29 LAB — HM DIABETES EYE EXAM

## 2016-06-06 DIAGNOSIS — I10 Essential (primary) hypertension: Secondary | ICD-10-CM | POA: Diagnosis not present

## 2016-06-06 DIAGNOSIS — Z0000 Encounter for general adult medical examination without abnormal findings: Secondary | ICD-10-CM | POA: Diagnosis not present

## 2016-06-06 DIAGNOSIS — N183 Chronic kidney disease, stage 3 (moderate): Secondary | ICD-10-CM | POA: Diagnosis not present

## 2016-06-06 DIAGNOSIS — R600 Localized edema: Secondary | ICD-10-CM | POA: Insufficient documentation

## 2016-06-06 DIAGNOSIS — I2510 Atherosclerotic heart disease of native coronary artery without angina pectoris: Secondary | ICD-10-CM | POA: Diagnosis not present

## 2016-06-10 ENCOUNTER — Other Ambulatory Visit: Payer: PPO

## 2016-06-13 ENCOUNTER — Ambulatory Visit
Admission: RE | Admit: 2016-06-13 | Discharge: 2016-06-13 | Disposition: A | Discharge status: Home / Self Care | Payer: PPO | Source: Ambulatory Visit | Attending: Family Medicine | Admitting: Family Medicine

## 2016-06-13 DIAGNOSIS — R928 Other abnormal and inconclusive findings on diagnostic imaging of breast: Secondary | ICD-10-CM | POA: Diagnosis not present

## 2016-06-13 DIAGNOSIS — N6489 Other specified disorders of breast: Secondary | ICD-10-CM | POA: Diagnosis not present

## 2016-06-18 ENCOUNTER — Encounter: Payer: Self-pay | Admitting: Family Medicine

## 2016-06-18 ENCOUNTER — Ambulatory Visit (INDEPENDENT_AMBULATORY_CARE_PROVIDER_SITE_OTHER): Payer: PPO | Admitting: Family Medicine

## 2016-06-18 VITALS — BP 125/74 | Temp 97.9°F | Wt 193.0 lb

## 2016-06-18 DIAGNOSIS — N2581 Secondary hyperparathyroidism of renal origin: Secondary | ICD-10-CM | POA: Diagnosis not present

## 2016-06-18 DIAGNOSIS — I10 Essential (primary) hypertension: Secondary | ICD-10-CM

## 2016-06-18 DIAGNOSIS — N183 Chronic kidney disease, stage 3 (moderate): Secondary | ICD-10-CM

## 2016-06-18 DIAGNOSIS — E785 Hyperlipidemia, unspecified: Secondary | ICD-10-CM | POA: Diagnosis not present

## 2016-06-18 DIAGNOSIS — I2510 Atherosclerotic heart disease of native coronary artery without angina pectoris: Secondary | ICD-10-CM

## 2016-06-18 DIAGNOSIS — E1122 Type 2 diabetes mellitus with diabetic chronic kidney disease: Secondary | ICD-10-CM

## 2016-06-18 DIAGNOSIS — Z23 Encounter for immunization: Secondary | ICD-10-CM

## 2016-06-18 LAB — LP+ALT+AST PICCOLO, WAIVED
ALT (SGPT) PICCOLO, WAIVED: 34 U/L (ref 10–47)
AST (SGOT) PICCOLO, WAIVED: 29 U/L (ref 11–38)
Chol/HDL Ratio Piccolo,Waive: 2.7 mg/dL
Cholesterol Piccolo, Waived: 127 mg/dL (ref <200)
HDL Chol Piccolo, Waived: 47 mg/dL — ABNORMAL LOW (ref >59)
LDL Chol Calc Piccolo Waived: 48 mg/dL (ref <100)
TRIGLYCERIDES PICCOLO,WAIVED: 161 mg/dL — AB (ref <150)
VLDL CHOL CALC PICCOLO,WAIVE: 32 mg/dL — AB (ref <30)

## 2016-06-18 LAB — BAYER DCA HB A1C WAIVED: HB A1C: 6.7 % (ref <7.0)

## 2016-06-18 NOTE — Assessment & Plan Note (Signed)
The current medical regimen is effective;  continue present plan and medications.  

## 2016-06-18 NOTE — Progress Notes (Signed)
BP 125/74 (BP Location: Left Arm, Patient Position: Sitting, Cuff Size: Normal)   Temp 97.9 F (36.6 C)   Wt 193 lb (87.5 kg) Comment: with shoes  SpO2 99%   BMI 36.47 kg/m    Subjective:    Patient ID: Julia Nielsen, female    DOB: 1941-03-18, 75 y.o.   MRN: [id number]  HPI: Julia Nielsen is a 75 y.o. female  Follow-up multiple medical problems which are stable diabetes good control has had trouble with her glucometer has got a new one which is been replaced but still getting high readings but her A1c today is 6.7 with no low blood sugar spells. Blood pressure doing well no complaints from medications good control Stable reports on her eyes Cholesterol no complaints with good control and normal blood work  Relevant past medical, surgical, family and social history reviewed and updated as indicated. Interim medical history since our last visit reviewed. Allergies and medications reviewed and updated.  Review of Systems  Constitutional: Negative.   Respiratory: Negative.   Cardiovascular: Negative.     Per HPI unless specifically indicated above     Objective:    BP 125/74 (BP Location: Left Arm, Patient Position: Sitting, Cuff Size: Normal)   Temp 97.9 F (36.6 C)   Wt 193 lb (87.5 kg) Comment: with shoes  SpO2 99%   BMI 36.47 kg/m   Wt Readings from Last 3 Encounters:  06/18/16 193 lb (87.5 kg)  12/11/15 190 lb 9.6 oz (86.5 kg)  07/05/15 194 lb (88 kg)    Physical Exam  Constitutional: She is oriented to person, place, and time. She appears well-developed and well-nourished. No distress.  HENT:  Head: Normocephalic and atraumatic.  Right Ear: Hearing normal.  Left Ear: Hearing normal.  Nose: Nose normal.  Eyes: Conjunctivae and lids are normal. Right eye exhibits no discharge. Left eye exhibits no discharge. No scleral icterus.  Cardiovascular: Normal rate and regular rhythm.   Pulmonary/Chest: Effort normal and breath sounds normal. No respiratory  distress.  Musculoskeletal: Normal range of motion.  Neurological: She is alert and oriented to person, place, and time.  Skin: Skin is intact. No rash noted.  Psychiatric: She has a normal mood and affect. Her speech is normal and behavior is normal. Judgment and thought content normal. Cognition and memory are normal.    Results for orders placed or performed in visit on 12/11/15  Microscopic Examination  Result Value Ref Range   WBC, UA 0-5 0 - 5 /hpf   RBC, UA 0-2 0 - 2 /hpf   Epithelial Cells (non renal) 0-10 0 - 10 /hpf   Mucus, UA Present Not Estab.   Bacteria, UA Few None seen/Few  Comprehensive metabolic panel  Result Value Ref Range   Glucose 262 (H) 65 - 99 mg/dL   BUN 18 8 - 27 mg/dL   Creatinine, Ser 1.37 (H) 0.57 - 1.00 mg/dL   GFR calc non Af Amer 37 (L) >59 mL/min/1.73   GFR calc Af Amer 43 (L) >59 mL/min/1.73   BUN/Creatinine Ratio 13 11 - 26   Sodium 142 134 - 144 mmol/L   Potassium 3.7 3.5 - 5.2 mmol/L   Chloride 101 96 - 106 mmol/L   CO2 20 18 - 29 mmol/L   Calcium 9.3 8.7 - 10.3 mg/dL   Total Protein 7.1 6.0 - 8.5 g/dL   Albumin 4.0 3.5 - 4.8 g/dL   Globulin, Total 3.1 1.5 - 4.5 g/dL   Albumin/Globulin  Ratio 1.3 1.1 - 2.5   Bilirubin Total 0.7 0.0 - 1.2 mg/dL   Alkaline Phosphatase 52 39 - 117 IU/L   AST 32 0 - 40 IU/L   ALT 32 0 - 32 IU/L  Lipid panel  Result Value Ref Range   Cholesterol, Total 148 100 - 199 mg/dL   Triglycerides 190 (H) 0 - 149 mg/dL   HDL 53 >39 mg/dL   VLDL Cholesterol Cal 38 5 - 40 mg/dL   LDL Calculated 57 0 - 99 mg/dL   Chol/HDL Ratio 2.8 0.0 - 4.4 ratio units  CBC with Differential/Platelet  Result Value Ref Range   WBC 7.2 3.4 - 10.8 x10E3/uL   RBC 4.67 3.77 - 5.28 x10E6/uL   Hemoglobin 13.9 11.1 - 15.9 g/dL   Hematocrit 42.2 34.0 - 46.6 %   MCV 90 79 - 97 fL   MCH 29.8 26.6 - 33.0 pg   MCHC 32.9 31.5 - 35.7 g/dL   RDW 14.5 12.3 - 15.4 %   Platelets 133 (L) 150 - 379 x10E3/uL   Neutrophils 62 %   Lymphs 27 %    Monocytes 8 %   Eos 3 %   Basos 0 %   Neutrophils Absolute 4.5 1.4 - 7.0 x10E3/uL   Lymphocytes Absolute 1.9 0.7 - 3.1 x10E3/uL   Monocytes Absolute 0.6 0.1 - 0.9 x10E3/uL   EOS (ABSOLUTE) 0.2 0.0 - 0.4 x10E3/uL   Basophils Absolute 0.0 0.0 - 0.2 x10E3/uL   Immature Granulocytes 0 %   Immature Grans (Abs) 0.0 0.0 - 0.1 x10E3/uL  TSH  Result Value Ref Range   TSH 3.600 0.450 - 4.500 uIU/mL  Urinalysis, Routine w reflex microscopic (not at [facility])  Result Value Ref Range   Specific Gravity, UA 1.010 1.005 - 1.030   pH, UA 5.0 5.0 - 7.5   Color, UA Yellow Yellow   Appearance Ur Cloudy (A) Clear   Leukocytes, UA Trace (A) Negative   Protein, UA Trace Negative/Trace   Glucose, UA Negative Negative   Ketones, UA Negative Negative   RBC, UA Negative Negative   Bilirubin, UA Negative Negative   Urobilinogen, Ur 0.2 0.2 - 1.0 mg/dL   Nitrite, UA Negative Negative   Microscopic Examination See below:   Bayer DCA Hb A1c Waived  Result Value Ref Range   Bayer DCA Hb A1c Waived 7.5 (H) <7.0 %      Assessment & Plan:   Problem List Items Addressed This Visit      Cardiovascular and Mediastinum   Hypertension    The current medical regimen is effective;  continue present plan and medications.       Relevant Medications   cloNIDine (CATAPRES) 0.1 MG tablet   CAD (coronary artery disease)    The current medical regimen is effective;  continue present plan and medications.       Relevant Medications   cloNIDine (CATAPRES) 0.1 MG tablet     Endocrine   Type 2 diabetes mellitus with stage 3 chronic kidney disease ([place]) - Primary    The current medical regimen is effective;  continue present plan and medications.       Relevant Orders   Bayer DCA Hb A1c Waived   LP+ALT+AST Piccolo, Waived   Basic metabolic panel   Secondary hyperparathyroidism of renal origin ([place])   Relevant Orders   PTH, intact (no Ca)     Other   Hyperlipidemia   Relevant Medications   cloNIDine  (CATAPRES) 0.1 MG tablet  Other Relevant Orders   Bayer DCA Hb A1c Waived   LP+ALT+AST Piccolo, Waived   Basic metabolic panel    Other Visit Diagnoses    Immunization due       Relevant Orders   Flu vaccine HIGH DOSE PF (Fluzone High dose) (Completed)       Follow up plan: Return in about 6 months (around 12/16/2016) for Physical Exam.

## 2016-06-19 ENCOUNTER — Encounter: Payer: Self-pay | Admitting: Family Medicine

## 2016-06-19 LAB — BASIC METABOLIC PANEL
BUN / CREAT RATIO: 19 (ref 12–28)
BUN: 20 mg/dL (ref 8–27)
CO2: 24 mmol/L (ref 18–29)
CREATININE: 1.07 mg/dL — AB (ref 0.57–1.00)
Calcium: 8.8 mg/dL (ref 8.7–10.3)
Chloride: 103 mmol/L (ref 96–106)
GFR, EST AFRICAN AMERICAN: 58 mL/min/{1.73_m2} — AB (ref >59)
GFR, EST NON AFRICAN AMERICAN: 50 mL/min/{1.73_m2} — AB (ref >59)
Glucose: 192 mg/dL — ABNORMAL HIGH (ref 65–99)
Potassium: 3.5 mmol/L (ref 3.5–5.2)
SODIUM: 142 mmol/L (ref 134–144)

## 2016-06-19 LAB — PARATHYROID HORMONE, INTACT (NO CA): PTH: 42 pg/mL (ref 15–65)

## 2016-07-03 DIAGNOSIS — R001 Bradycardia, unspecified: Secondary | ICD-10-CM | POA: Diagnosis not present

## 2016-07-03 DIAGNOSIS — E669 Obesity, unspecified: Secondary | ICD-10-CM | POA: Diagnosis not present

## 2016-07-03 DIAGNOSIS — I10 Essential (primary) hypertension: Secondary | ICD-10-CM | POA: Diagnosis not present

## 2016-07-03 DIAGNOSIS — R600 Localized edema: Secondary | ICD-10-CM | POA: Diagnosis not present

## 2016-08-02 DIAGNOSIS — R809 Proteinuria, unspecified: Secondary | ICD-10-CM | POA: Diagnosis not present

## 2016-08-02 DIAGNOSIS — N183 Chronic kidney disease, stage 3 (moderate): Secondary | ICD-10-CM | POA: Diagnosis not present

## 2016-08-02 DIAGNOSIS — I129 Hypertensive chronic kidney disease with stage 1 through stage 4 chronic kidney disease, or unspecified chronic kidney disease: Secondary | ICD-10-CM | POA: Diagnosis not present

## 2016-08-02 DIAGNOSIS — E1122 Type 2 diabetes mellitus with diabetic chronic kidney disease: Secondary | ICD-10-CM | POA: Diagnosis not present

## 2016-10-02 DIAGNOSIS — H401131 Primary open-angle glaucoma, bilateral, mild stage: Secondary | ICD-10-CM | POA: Diagnosis not present

## 2016-10-15 ENCOUNTER — Ambulatory Visit (INDEPENDENT_AMBULATORY_CARE_PROVIDER_SITE_OTHER): Payer: PPO | Admitting: Unknown Physician Specialty

## 2016-10-15 ENCOUNTER — Encounter: Payer: Self-pay | Admitting: Unknown Physician Specialty

## 2016-10-15 VITALS — BP 175/78 | HR 68 | Temp 97.9°F | Wt 193.8 lb

## 2016-10-15 DIAGNOSIS — E1122 Type 2 diabetes mellitus with diabetic chronic kidney disease: Secondary | ICD-10-CM | POA: Diagnosis not present

## 2016-10-15 DIAGNOSIS — J320 Chronic maxillary sinusitis: Secondary | ICD-10-CM | POA: Diagnosis not present

## 2016-10-15 DIAGNOSIS — I10 Essential (primary) hypertension: Secondary | ICD-10-CM

## 2016-10-15 DIAGNOSIS — N183 Chronic kidney disease, stage 3 (moderate): Secondary | ICD-10-CM | POA: Diagnosis not present

## 2016-10-15 LAB — BAYER DCA HB A1C WAIVED: HB A1C: 8.3 % — AB (ref <7.0)

## 2016-10-15 LAB — GLUCOSE HEMOCUE WAIVED: GLU HEMOCUE WAIVED: 301 mg/dL — AB (ref 65–99)

## 2016-10-15 MED ORDER — GLIPIZIDE 5 MG PO TABS: 5.0000 mg | ORAL_TABLET | Freq: Two times a day (BID) | ORAL | 3 refills | Status: DC

## 2016-10-15 MED ORDER — AMOXICILLIN-POT CLAVULANATE 875-125 MG PO TABS: 1.0000 | ORAL_TABLET | Freq: Two times a day (BID) | ORAL | 0 refills | Status: DC

## 2016-10-15 NOTE — Progress Notes (Signed)
BP (!) 175/78 (BP Location: Left Arm, Patient Position: Sitting, Cuff Size: Large)   Pulse 68   Temp 97.9 F (36.6 C)   Wt 193 lb 12.8 oz (87.9 kg)   SpO2 97%   BMI 36.62 kg/m    Subjective:    Patient ID: Julia Nielsen, female    DOB: [birth year], 76 y.o.   MRN: [id number]  HPI: Julia Nielsen is a 76 y.o. female  Chief Complaint  Patient presents with  . other    pt states every morning when she gets up, she has to blow her nose and states she notices blood in it. States this happens for about an hour every monring. Patient also states that her BS has been running higher than normal and wonders if the blood from her nose may be related to the increase in blood sugar readings    Pt has noticed a problem with higher than typical blood sugar numbers.  She takes her BS in the AM and has noticed it being in the low 200s.  She is eating a low carb diet.    Sinus Problem  This is a new problem. Episode onset: 3 months. The problem has been gradually worsening since onset. There has been no fever. Associated symptoms include congestion and sinus pressure. (Nose bleeds and thick mucous) Treatments tried: Flonase and Mucinex.     Relevant past medical, surgical, family and social history reviewed and updated as indicated. Interim medical history since our last visit reviewed. Allergies and medications reviewed and updated.  Review of Systems  HENT: Positive for congestion and sinus pressure.     Per HPI unless specifically indicated above     Objective:    BP (!) 175/78 (BP Location: Left Arm, Patient Position: Sitting, Cuff Size: Large)   Pulse 68   Temp 97.9 F (36.6 C)   Wt 193 lb 12.8 oz (87.9 kg)   SpO2 97%   BMI 36.62 kg/m   Wt Readings from Last 3 Encounters:  10/15/16 193 lb 12.8 oz (87.9 kg)  06/18/16 193 lb (87.5 kg)  12/11/15 190 lb 9.6 oz (86.5 kg)    Physical Exam  Constitutional: She is oriented to person, place, and time. She appears well-developed and  well-nourished. No distress.  HENT:  Head: Normocephalic and atraumatic.  Right Ear: Tympanic membrane and ear canal normal.  Left Ear: Tympanic membrane and ear canal normal.  Nose: No rhinorrhea. Right sinus exhibits maxillary sinus tenderness. Right sinus exhibits no frontal sinus tenderness. Left sinus exhibits maxillary sinus tenderness. Left sinus exhibits no frontal sinus tenderness.  Eyes: Conjunctivae and lids are normal. Right eye exhibits no discharge. Left eye exhibits no discharge. No scleral icterus.  Cardiovascular: Normal rate and regular rhythm.   Pulmonary/Chest: Effort normal and breath sounds normal. No respiratory distress.  Abdominal: Normal appearance. There is no splenomegaly or hepatomegaly.  Musculoskeletal: Normal range of motion.  Neurological: She is alert and oriented to person, place, and time.  Skin: Skin is intact. No rash noted. No pallor.  Psychiatric: She has a normal mood and affect. Her behavior is normal. Judgment and thought content normal.   Blood sugar 300 today Results for orders placed or performed in visit on 06/26/16  HM DIABETES EYE EXAM  Result Value Ref Range   HM Diabetic Eye Exam No Retinopathy No Retinopathy      Assessment & Plan:    Problem List Items Addressed This Visit      Unprioritized  Hypertension    Recheck BP 160/74.  BP and blood sugar both high today.  ? Related to sinusitis.  Will treat Sinusitis and increase glipizide.         Type 2 diabetes mellitus with stage 3 chronic kidney disease (HCC) - Primary    Increase Glipizide to 5 mg twice a day.        Relevant Medications   glipiZIDE (GLUCOTROL) 5 MG tablet   Other Relevant Orders   Comprehensive metabolic panel   Bayer DCA Hb A1c Waived   Glucose Hemocue Waived    Other Visit Diagnoses    Chronic maxillary sinusitis       Relevant Medications   amoxicillin-clavulanate (AUGMENTIN) 875-125 MG tablet      BP and blood sugar both high today.  ? Related  to sinusitis.  Will treat Sinusitis and increase glipizide.    Follow up plan: Return for 1-2 weeks.

## 2016-10-15 NOTE — Assessment & Plan Note (Addendum)
Recheck BP 160/74.  BP and blood sugar both high today.  ? Related to sinusitis.  Will treat Sinusitis and increase glipizide.

## 2016-10-15 NOTE — Assessment & Plan Note (Signed)
Increase Glipizide to 5 mg twice a day.

## 2016-10-16 LAB — COMPREHENSIVE METABOLIC PANEL
ALBUMIN: 3.8 g/dL (ref 3.5–4.8)
ALK PHOS: 62 IU/L (ref 39–117)
ALT: 29 IU/L (ref 0–32)
AST: 25 IU/L (ref 0–40)
Albumin/Globulin Ratio: 1.5 (ref 1.2–2.2)
BUN / CREAT RATIO: 17 (ref 12–28)
BUN: 18 mg/dL (ref 8–27)
Bilirubin Total: 0.7 mg/dL (ref 0.0–1.2)
CALCIUM: 8.8 mg/dL (ref 8.7–10.3)
CO2: 25 mmol/L (ref 18–29)
CREATININE: 1.08 mg/dL — AB (ref 0.57–1.00)
Chloride: 100 mmol/L (ref 96–106)
GFR calc Af Amer: 57 mL/min/{1.73_m2} — ABNORMAL LOW (ref >59)
GFR calc non Af Amer: 50 mL/min/{1.73_m2} — ABNORMAL LOW (ref >59)
GLOBULIN, TOTAL: 2.6 g/dL (ref 1.5–4.5)
Glucose: 279 mg/dL — ABNORMAL HIGH (ref 65–99)
Potassium: 4 mmol/L (ref 3.5–5.2)
SODIUM: 140 mmol/L (ref 134–144)
Total Protein: 6.4 g/dL (ref 6.0–8.5)

## 2016-10-22 ENCOUNTER — Ambulatory Visit: Payer: PPO | Admitting: Unknown Physician Specialty

## 2016-10-29 ENCOUNTER — Ambulatory Visit (INDEPENDENT_AMBULATORY_CARE_PROVIDER_SITE_OTHER): Payer: PPO | Admitting: Unknown Physician Specialty

## 2016-10-29 ENCOUNTER — Encounter: Payer: Self-pay | Admitting: Unknown Physician Specialty

## 2016-10-29 DIAGNOSIS — E1122 Type 2 diabetes mellitus with diabetic chronic kidney disease: Secondary | ICD-10-CM

## 2016-10-29 DIAGNOSIS — N183 Chronic kidney disease, stage 3 unspecified: Secondary | ICD-10-CM

## 2016-10-29 DIAGNOSIS — I129 Hypertensive chronic kidney disease with stage 1 through stage 4 chronic kidney disease, or unspecified chronic kidney disease: Secondary | ICD-10-CM

## 2016-10-29 NOTE — Assessment & Plan Note (Signed)
High today.  Stop allergy meds.  Due to see Nephrology soon.

## 2016-10-29 NOTE — Progress Notes (Signed)
BP (!) 175/86 (BP Location: Left Arm, Patient Position: Sitting, Cuff Size: Large)   Pulse 61   Temp 97.6 F (36.4 C)   Wt 192 lb 12.8 oz (87.5 kg)   SpO2 97%   BMI 36.43 kg/m    Subjective:    Patient ID: Julia Nielsen, female    DOB: [birth year], 76 y.o.   MRN: [id number]  HPI: Julia Nielsen is a 76 y.o. female  Chief Complaint  Patient presents with  . Diabetes    1 week f/up, states BS has been coming down since increasing glipizide.   Diabetes Increased Glipizide last visit.  Readings have decreased from mid 200s to high 100s. States she is feeling better.     Hypertension High today but pt states it is OK with Dr. Nehemiah Massed.  Also sees Nephrology.  Pt states she is she is taking OTC medications for congestion.    Relevant past medical, surgical, family and social history reviewed and updated as indicated. Interim medical history since our last visit reviewed. Allergies and medications reviewed and updated.  Review of Systems  Per HPI unless specifically indicated above     Objective:    BP (!) 175/86 (BP Location: Left Arm, Patient Position: Sitting, Cuff Size: Large)   Pulse 61   Temp 97.6 F (36.4 C)   Wt 192 lb 12.8 oz (87.5 kg)   SpO2 97%   BMI 36.43 kg/m   Wt Readings from Last 3 Encounters:  10/29/16 192 lb 12.8 oz (87.5 kg)  10/15/16 193 lb 12.8 oz (87.9 kg)  06/18/16 193 lb (87.5 kg)    Physical Exam  Constitutional: She is oriented to person, place, and time. She appears well-developed and well-nourished. No distress.  HENT:  Head: Normocephalic and atraumatic.  Eyes: Conjunctivae and lids are normal. Right eye exhibits no discharge. Left eye exhibits no discharge. No scleral icterus.  Neck: Normal range of motion. Neck supple. No JVD present. Carotid bruit is not present.  Cardiovascular: Normal rate, regular rhythm and normal heart sounds.   Pulmonary/Chest: Effort normal and breath sounds normal.  Abdominal: Normal appearance. There is no  splenomegaly or hepatomegaly.  Musculoskeletal: Normal range of motion.  Neurological: She is alert and oriented to person, place, and time.  Skin: Skin is warm, dry and intact. No rash noted. No pallor.  Psychiatric: She has a normal mood and affect. Her behavior is normal. Judgment and thought content normal.    Results for orders placed or performed in visit on 10/15/16  Comprehensive metabolic panel  Result Value Ref Range   Glucose 279 (H) 65 - 99 mg/dL   BUN 18 8 - 27 mg/dL   Creatinine, Ser 1.08 (H) 0.57 - 1.00 mg/dL   GFR calc non Af Amer 50 (L) >59 mL/min/1.73   GFR calc Af Amer 57 (L) >59 mL/min/1.73   BUN/Creatinine Ratio 17 12 - 28   Sodium 140 134 - 144 mmol/L   Potassium 4.0 3.5 - 5.2 mmol/L   Chloride 100 96 - 106 mmol/L   CO2 25 18 - 29 mmol/L   Calcium 8.8 8.7 - 10.3 mg/dL   Total Protein 6.4 6.0 - 8.5 g/dL   Albumin 3.8 3.5 - 4.8 g/dL   Globulin, Total 2.6 1.5 - 4.5 g/dL   Albumin/Globulin Ratio 1.5 1.2 - 2.2   Bilirubin Total 0.7 0.0 - 1.2 mg/dL   Alkaline Phosphatase 62 39 - 117 IU/L   AST 25 0 - 40 IU/L  ALT 29 0 - 32 IU/L  Bayer DCA Hb A1c Waived  Result Value Ref Range   Bayer DCA Hb A1c Waived 8.3 (H) <7.0 %  Glucose Hemocue Waived  Result Value Ref Range   Glu Hemocue Waived 301 (H) 65 - 99 mg/dL      Assessment & Plan:   Problem List Items Addressed This Visit      Unprioritized   Benign hypertensive renal disease    High today.  Stop allergy meds.  Due to see Nephrology soon.        Type 2 diabetes mellitus with stage 3 chronic kidney disease ([place])    Improving.  Continue present meds.  Due to see Dr. Jeananne Rama in April for PR          Follow up plan: No Follow-up on file.

## 2016-10-29 NOTE — Assessment & Plan Note (Signed)
Improving.  Continue present meds.  Due to see Dr. Jeananne Rama in April for PR

## 2016-11-01 ENCOUNTER — Telehealth: Payer: Self-pay | Admitting: Family Medicine

## 2016-11-01 NOTE — Telephone Encounter (Signed)
Medication was sent to pharmacy on 10/15/16. Pt called and made aware.

## 2016-11-01 NOTE — Telephone Encounter (Signed)
Patient was seen on Tues by Malachy Mood and per the patient she understood Malachy Mood was going to call in a prescription for her Glipizide to be use twice a day instead of the way she normally does once a day.  She has not heard back from [place] to see if this was done.  Thank You Kaycee Yim [phone]

## 2016-11-05 ENCOUNTER — Telehealth: Payer: Self-pay | Admitting: Family Medicine

## 2016-11-05 DIAGNOSIS — R928 Other abnormal and inconclusive findings on diagnostic imaging of breast: Secondary | ICD-10-CM

## 2016-11-05 NOTE — Telephone Encounter (Signed)
Spoke with patient, just wanted to reiterate that she needed an appointment.

## 2016-11-05 NOTE — Telephone Encounter (Signed)
Per last mammogram report,   "IMPRESSION: 1. Resolution of the asymmetry of concern in the lateral right breast consistent with overlapping fibroglandular tissue.  2. Incidentally identified 2 mm probably benign mass in the right breast at 9:30. This may represent the oil cyst identified mammographically.  RECOMMENDATION: Six-month follow-up right breast diagnostic mammogram and ultrasound is recommended."

## 2016-11-18 ENCOUNTER — Telehealth: Payer: Self-pay | Admitting: Family Medicine

## 2016-11-18 DIAGNOSIS — R928 Other abnormal and inconclusive findings on diagnostic imaging of breast: Secondary | ICD-10-CM

## 2016-11-18 NOTE — Telephone Encounter (Signed)
Please see telephone call from 11/05/16.

## 2016-11-19 NOTE — Telephone Encounter (Signed)
Provider double checked orders. Patient will need to call [phone] to schedule.

## 2016-11-20 DIAGNOSIS — H401131 Primary open-angle glaucoma, bilateral, mild stage: Secondary | ICD-10-CM | POA: Diagnosis not present

## 2016-11-26 ENCOUNTER — Telehealth: Payer: Self-pay | Admitting: Family Medicine

## 2016-11-26 NOTE — Telephone Encounter (Signed)
Orders have been placed by provider. Pt can call [phone] to schedule

## 2016-11-26 NOTE — Telephone Encounter (Signed)
Pt called and stated that she would like to have an order to go to [facility] for mammogram.

## 2016-11-29 ENCOUNTER — Other Ambulatory Visit: Payer: Self-pay

## 2016-11-29 DIAGNOSIS — N6310 Unspecified lump in the right breast, unspecified quadrant: Secondary | ICD-10-CM

## 2016-12-02 ENCOUNTER — Other Ambulatory Visit: Payer: Self-pay | Admitting: Family Medicine

## 2016-12-02 DIAGNOSIS — R928 Other abnormal and inconclusive findings on diagnostic imaging of breast: Secondary | ICD-10-CM

## 2016-12-18 ENCOUNTER — Encounter: Payer: PPO | Admitting: Family Medicine

## 2016-12-20 ENCOUNTER — Ambulatory Visit
Admission: RE | Admit: 2016-12-20 | Discharge: 2016-12-20 | Disposition: A | Discharge status: Home / Self Care | Payer: PPO | Source: Ambulatory Visit | Attending: Family Medicine | Admitting: Family Medicine

## 2016-12-20 ENCOUNTER — Encounter: Payer: Self-pay | Admitting: Family Medicine

## 2016-12-20 DIAGNOSIS — R928 Other abnormal and inconclusive findings on diagnostic imaging of breast: Secondary | ICD-10-CM | POA: Diagnosis not present

## 2016-12-20 DIAGNOSIS — N6489 Other specified disorders of breast: Secondary | ICD-10-CM | POA: Diagnosis not present

## 2016-12-20 DIAGNOSIS — N6001 Solitary cyst of right breast: Secondary | ICD-10-CM | POA: Insufficient documentation

## 2016-12-20 LAB — HM MAMMOGRAPHY

## 2016-12-28 ENCOUNTER — Other Ambulatory Visit: Payer: Self-pay | Admitting: Family Medicine

## 2016-12-28 DIAGNOSIS — I10 Essential (primary) hypertension: Secondary | ICD-10-CM

## 2016-12-28 DIAGNOSIS — M8580 Other specified disorders of bone density and structure, unspecified site: Secondary | ICD-10-CM

## 2016-12-28 DIAGNOSIS — I2510 Atherosclerotic heart disease of native coronary artery without angina pectoris: Secondary | ICD-10-CM

## 2016-12-30 DIAGNOSIS — I25118 Atherosclerotic heart disease of native coronary artery with other forms of angina pectoris: Secondary | ICD-10-CM | POA: Diagnosis not present

## 2016-12-30 DIAGNOSIS — I071 Rheumatic tricuspid insufficiency: Secondary | ICD-10-CM | POA: Diagnosis not present

## 2016-12-30 DIAGNOSIS — R600 Localized edema: Secondary | ICD-10-CM | POA: Diagnosis not present

## 2016-12-30 DIAGNOSIS — I6523 Occlusion and stenosis of bilateral carotid arteries: Secondary | ICD-10-CM | POA: Diagnosis not present

## 2016-12-31 NOTE — Telephone Encounter (Signed)
Done

## 2017-01-01 DIAGNOSIS — H401131 Primary open-angle glaucoma, bilateral, mild stage: Secondary | ICD-10-CM | POA: Diagnosis not present

## 2017-01-14 ENCOUNTER — Encounter: Payer: Self-pay | Admitting: Family Medicine

## 2017-01-14 ENCOUNTER — Ambulatory Visit (INDEPENDENT_AMBULATORY_CARE_PROVIDER_SITE_OTHER): Payer: PPO | Admitting: Family Medicine

## 2017-01-14 VITALS — BP 160/60 | HR 82 | Ht 61.42 in | Wt 193.0 lb

## 2017-01-14 DIAGNOSIS — I2510 Atherosclerotic heart disease of native coronary artery without angina pectoris: Secondary | ICD-10-CM

## 2017-01-14 DIAGNOSIS — J309 Allergic rhinitis, unspecified: Secondary | ICD-10-CM | POA: Diagnosis not present

## 2017-01-14 DIAGNOSIS — Z0000 Encounter for general adult medical examination without abnormal findings: Secondary | ICD-10-CM

## 2017-01-14 DIAGNOSIS — E785 Hyperlipidemia, unspecified: Secondary | ICD-10-CM | POA: Diagnosis not present

## 2017-01-14 DIAGNOSIS — I10 Essential (primary) hypertension: Secondary | ICD-10-CM

## 2017-01-14 DIAGNOSIS — M8580 Other specified disorders of bone density and structure, unspecified site: Secondary | ICD-10-CM | POA: Diagnosis not present

## 2017-01-14 DIAGNOSIS — E039 Hypothyroidism, unspecified: Secondary | ICD-10-CM | POA: Diagnosis not present

## 2017-01-14 DIAGNOSIS — E1122 Type 2 diabetes mellitus with diabetic chronic kidney disease: Secondary | ICD-10-CM

## 2017-01-14 DIAGNOSIS — N183 Chronic kidney disease, stage 3 (moderate): Secondary | ICD-10-CM

## 2017-01-14 LAB — URINALYSIS, ROUTINE W REFLEX MICROSCOPIC
Bilirubin, UA: NEGATIVE
Glucose, UA: NEGATIVE
Ketones, UA: NEGATIVE
Leukocytes, UA: NEGATIVE
NITRITE UA: NEGATIVE
Specific Gravity, UA: 1.015 (ref 1.005–1.030)
Urobilinogen, Ur: 0.2 mg/dL (ref 0.2–1.0)
pH, UA: 5 (ref 5.0–7.5)

## 2017-01-14 LAB — HEMOGLOBIN A1C
Est. average glucose Bld gHb Est-mCnc: 189 mg/dL
Hgb A1c MFr Bld: 8.2 % — ABNORMAL HIGH (ref 4.8–5.6)

## 2017-01-14 LAB — MICROSCOPIC EXAMINATION: BACTERIA UA: NONE SEEN

## 2017-01-14 MED ORDER — AMLODIPINE BESYLATE 10 MG PO TABS: 10.0000 mg | ORAL_TABLET | Freq: Every day | ORAL | 4 refills | Status: DC

## 2017-01-14 MED ORDER — LEVOTHYROXINE SODIUM 150 MCG PO TABS: 150.0000 ug | ORAL_TABLET | Freq: Every day | ORAL | 4 refills | Status: DC

## 2017-01-14 MED ORDER — LOVASTATIN 40 MG PO TABS: 40.0000 mg | ORAL_TABLET | Freq: Every day | ORAL | 4 refills | Status: DC

## 2017-01-14 MED ORDER — RALOXIFENE HCL 60 MG PO TABS: 60.0000 mg | ORAL_TABLET | Freq: Every day | ORAL | 4 refills | Status: DC

## 2017-01-14 MED ORDER — BENAZEPRIL HCL 40 MG PO TABS: 40.0000 mg | ORAL_TABLET | Freq: Every day | ORAL | 4 refills | Status: DC

## 2017-01-14 MED ORDER — FLUTICASONE PROPIONATE 50 MCG/ACT NA SUSP: 2.0000 | Freq: Every day | NASAL | 4 refills | Status: DC

## 2017-01-14 MED ORDER — GLIPIZIDE 5 MG PO TABS
5.0000 mg | ORAL_TABLET | Freq: Two times a day (BID) | ORAL | 4 refills | Status: DC
Start: 2017-01-14 — End: 2018-03-23

## 2017-01-14 MED ORDER — CLONIDINE HCL 0.1 MG PO TABS: 0.1000 mg | ORAL_TABLET | Freq: Two times a day (BID) | ORAL | 4 refills | Status: DC

## 2017-01-14 MED ORDER — SITAGLIPTIN PHOSPHATE 100 MG PO TABS: 100.0000 mg | ORAL_TABLET | Freq: Every day | ORAL | 4 refills | Status: DC

## 2017-01-14 MED ORDER — CARVEDILOL 25 MG PO TABS: 25.0000 mg | ORAL_TABLET | Freq: Two times a day (BID) | ORAL | 4 refills | Status: DC

## 2017-01-14 NOTE — Assessment & Plan Note (Signed)
The current medical regimen is effective;  continue present plan and medications.  

## 2017-01-14 NOTE — Progress Notes (Signed)
BP (!) 160/60 (BP Location: Left Arm)   Pulse 82   Ht 5' 1.42" (1.56 m)   Wt 193 lb (87.5 kg)   SpO2 97%   BMI 35.97 kg/m    Subjective:    Patient ID: Julia Nielsen, female    DOB: [birth year], 76 y.o.   MRN: [id number]  HPI: Julia Nielsen is a 76 y.o. female  Chief Complaint  Patient presents with  . Annual Exam  . Diabetes    Sugar runnin in the 200s  . Hypertension  . Hyperlipidemia   AWV metrics met Patient with multiple medical issues all in all doing well getting good reports from cardiology. Blood pressure cardiologist was okay. Patient's biggest concern is diabetes taking glipizide to a day and not really helping diabetes as blood sugars consistently in the 200 range. Noted low blood sugar spells. Taking eyedrops for glaucoma without problems thyroid medications cholesterol medications and blood pressure medications all faithfully and without problems.   This from cardiology note last month  The patient is having difficulty with acute on chronic dyspnea and or shortness of breath worsening with increased severity over the last 2 months. This occurs with mild exertion and limits ADLs and is also associated with walking and relieved by rest with a duration that is intermittent (1-10 minutes). Other related symptoms include fatigue, irregular heart beat and lower extremity edema. The patient has risk factors of this occurrence including age, postmenopausal female, diabetes, HTN, Hyperlipidemia, history of heart disease, obesity and sedentary life style. 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 Coronary artery disease The patient has coronary artery disease previously diagnosed by imaging studies. Currently the patient has had new onset of symptoms and signs of possible progression of stenosis, angina, and/or anginal equivalent. We have discussed the diagnosis, medical management, possible intervention, risks, and benefits of their current condition. We will proceed step-wise through treatment and options as needed. Peripheral vascular disease The patient has chronic peripheral vascular disease including carotid artery disease without significant symptoms and/or complications on Beta blocker, Lipid lowering medication and Blood pressure treatment. Current risk factors include Diabetes Type II , Hypertension , Coronary disease , Peripheral vascular disease and Obesity . We have discussed risk factor modification including treatment of diabetes, high blood pressure and high cholesterol with medication management for further long-term risk reduction. The patient understands and agrees. Patient has a stress test scheduled later this month. States her shortness of breath symptoms are better Relevant past medical, surgical, family and social history reviewed and updated as indicated. Interim medical history since our last visit reviewed. Allergies and medications reviewed and updated.  Review of Systems  Constitutional: Negative.   HENT: Negative.   Eyes: Negative.   Respiratory: Negative.   Cardiovascular: Negative.   Gastrointestinal: Negative.   Endocrine: Negative.   Genitourinary: Negative.   Musculoskeletal: Negative.   Skin: Negative.   Allergic/Immunologic: Negative.   Neurological: Negative.   Hematological: Negative.   Psychiatric/Behavioral: Negative.    good  reports from mammogram patient back on your  scheduling  Per HPI unless specifically indicated above     Objective:    BP (!) 160/60 (BP Location: Left Arm)   Pulse 82   Ht 5' 1.42" (1.56 m)   Wt 193 lb (87.5 kg)   SpO2 97%   BMI 35.97 kg/m   Wt Readings from Last 3 Encounters:  01/14/17 193 lb (87.5 kg)  10/29/16 192 lb 12.8 oz (87.5 kg)  10/15/16 193 lb 12.8 oz (87.9 kg)    Physical Exam  Constitutional: She is oriented to person, place, and time. She appears well-developed and well-nourished.  HENT:  Head: Normocephalic and atraumatic.  Right Ear: External ear normal.  Left Ear: External ear normal.  Nose: Nose normal.  Mouth/Throat: Oropharynx is clear and moist.  Eyes: Conjunctivae and EOM are normal. Pupils are equal, round, and reactive to light.  Neck: Normal range of motion. Neck supple. Carotid bruit is not present.  Cardiovascular: Normal rate and regular rhythm.   No murmur heard. Pulmonary/Chest: Effort normal and breath sounds normal. She exhibits no mass. Right breast exhibits no mass, no skin change and no tenderness. Left breast exhibits no mass, no skin change and no tenderness. Breasts are symmetrical.  Breast exam not done here is just done at mammography and normal  Abdominal: Soft. Bowel sounds are normal. There is no hepatosplenomegaly.  Musculoskeletal: Normal range of motion.  Neurological: She is alert and oriented to person, place, and time.  Skin: No rash noted.  Psychiatric: She has a normal mood and affect. Her behavior is normal. Judgment and thought content normal.    Results for orders placed or performed in visit on 12/20/16  HM MAMMOGRAPHY  Result Value Ref Range   HM Mammogram 0-4 Bi-Rad 0-4 Bi-Rad, Self Reported Normal      Assessment & Plan:   Problem List Items Addressed This Visit      Cardiovascular and Mediastinum   Hypertension    His blood pressure elevated but has been good control will leave medications alone and is on  high-dose medicine now and don't want to get too low with fall risk      Relevant Medications   lovastatin (MEVACOR) 40 MG tablet   carvedilol (COREG) 25 MG tablet   benazepril (LOTENSIN) 40 MG tablet   amLODipine (NORVASC) 10 MG tablet   cloNIDine (CATAPRES) 0.1 MG tablet   Other Relevant Orders   CBC with Differential/Platelet   Comprehensive metabolic panel   Urinalysis, Routine w reflex microscopic   Bayer DCA Hb A1c Waived   CAD (coronary artery disease)    Followed by cardiology has stress test later this month      Relevant Medications   lovastatin (MEVACOR) 40 MG tablet   carvedilol (COREG) 25 MG tablet   benazepril (LOTENSIN) 40 MG tablet   amLODipine (NORVASC) 10 MG tablet   cloNIDine (CATAPRES) 0.1 MG tablet     Respiratory   Multiple respiratory allergies   Relevant Medications   fluticasone (FLONASE) 50 MCG/ACT nasal spray     Endocrine   Type 2 diabetes mellitus with stage 3 chronic kidney disease (HCC)    Diabetes poor control with hemoglobin A1c of 8.3 today will not add metformin at this point will observe renal function a year ago patient was GFR of 37 last GFR was up to 50. We will add Januvia      Relevant Medications   lovastatin (MEVACOR) 40 MG tablet   benazepril (LOTENSIN) 40 MG tablet   glipiZIDE (GLUCOTROL)  5 MG tablet   sitaGLIPtin (JANUVIA) 100 MG tablet   Other Relevant Orders   CBC with Differential/Platelet   Comprehensive metabolic panel   Urinalysis, Routine w reflex microscopic   Bayer DCA Hb A1c Waived   Hypothyroidism    The current medical regimen is effective;  continue present plan and medications.       Relevant Medications   levothyroxine (SYNTHROID, LEVOTHROID) 150 MCG tablet   carvedilol (COREG) 25 MG tablet   Other Relevant Orders   CBC with Differential/Platelet   Comprehensive metabolic panel   TSH   Urinalysis, Routine w reflex microscopic     Musculoskeletal and Integument   Osteopenia   Relevant Medications     raloxifene (EVISTA) 60 MG tablet     Other   Hyperlipidemia    The current medical regimen is effective;  continue present plan and medications.       Relevant Medications   lovastatin (MEVACOR) 40 MG tablet   carvedilol (COREG) 25 MG tablet   benazepril (LOTENSIN) 40 MG tablet   amLODipine (NORVASC) 10 MG tablet   cloNIDine (CATAPRES) 0.1 MG tablet   Other Relevant Orders   CBC with Differential/Platelet   Comprehensive metabolic panel   Lipid panel   Urinalysis, Routine w reflex microscopic   Bayer DCA Hb A1c Waived    Other Visit Diagnoses    Annual physical exam    -  Primary   Relevant Orders   CBC with Differential/Platelet   Comprehensive metabolic panel   Lipid panel   TSH   Urinalysis, Routine w reflex microscopic   Bayer DCA Hb A1c Waived       Follow up plan: Return for Hemoglobin A1c.

## 2017-01-14 NOTE — Assessment & Plan Note (Signed)
His blood pressure elevated but has been good control will leave medications alone and is on high-dose medicine now and don't want to get too low with fall risk

## 2017-01-14 NOTE — Assessment & Plan Note (Signed)
Followed by cardiology has stress test later this month

## 2017-01-14 NOTE — Assessment & Plan Note (Signed)
Diabetes poor control with hemoglobin A1c of 8.3 today will not add metformin at this point will observe renal function a year ago patient was GFR of 37 last GFR was up to 50. We will add Januvia

## 2017-01-15 ENCOUNTER — Telehealth: Payer: Self-pay | Admitting: Family Medicine

## 2017-01-15 DIAGNOSIS — R7989 Other specified abnormal findings of blood chemistry: Secondary | ICD-10-CM

## 2017-01-15 DIAGNOSIS — D696 Thrombocytopenia, unspecified: Secondary | ICD-10-CM

## 2017-01-15 LAB — CBC WITH DIFFERENTIAL/PLATELET
BASOS ABS: 0 10*3/uL (ref 0.0–0.2)
Basos: 1 %
EOS (ABSOLUTE): 0.1 10*3/uL (ref 0.0–0.4)
EOS: 2 %
HEMOGLOBIN: 13.7 g/dL (ref 11.1–15.9)
Hematocrit: 42.3 % (ref 34.0–46.6)
IMMATURE GRANS (ABS): 0 10*3/uL (ref 0.0–0.1)
IMMATURE GRANULOCYTES: 0 %
LYMPHS: 36 %
Lymphocytes Absolute: 1.4 10*3/uL (ref 0.7–3.1)
MCH: 29 pg (ref 26.6–33.0)
MCHC: 32.4 g/dL (ref 31.5–35.7)
MCV: 89 fL (ref 79–97)
MONOCYTES: 8 %
Monocytes Absolute: 0.3 10*3/uL (ref 0.1–0.9)
Neutrophils Absolute: 2.2 10*3/uL (ref 1.4–7.0)
Neutrophils: 53 %
PLATELETS: 87 10*3/uL — AB (ref 150–379)
RBC: 4.73 x10E6/uL (ref 3.77–5.28)
RDW: 14.4 % (ref 12.3–15.4)
WBC: 4 10*3/uL (ref 3.4–10.8)

## 2017-01-15 LAB — COMPREHENSIVE METABOLIC PANEL
ALBUMIN: 3.9 g/dL (ref 3.5–4.8)
ALT: 33 IU/L — AB (ref 0–32)
AST: 35 IU/L (ref 0–40)
Albumin/Globulin Ratio: 1.6 (ref 1.2–2.2)
Alkaline Phosphatase: 63 IU/L (ref 39–117)
BUN/Creatinine Ratio: 10 — ABNORMAL LOW (ref 12–28)
BUN: 12 mg/dL (ref 8–27)
Bilirubin Total: 0.7 mg/dL (ref 0.0–1.2)
CALCIUM: 8.9 mg/dL (ref 8.7–10.3)
CHLORIDE: 99 mmol/L (ref 96–106)
CO2: 23 mmol/L (ref 18–29)
Creatinine, Ser: 1.2 mg/dL — ABNORMAL HIGH (ref 0.57–1.00)
GFR calc non Af Amer: 44 mL/min/{1.73_m2} — ABNORMAL LOW (ref >59)
GFR, EST AFRICAN AMERICAN: 50 mL/min/{1.73_m2} — AB (ref >59)
GLUCOSE: 221 mg/dL — AB (ref 65–99)
Globulin, Total: 2.4 g/dL (ref 1.5–4.5)
POTASSIUM: 3.6 mmol/L (ref 3.5–5.2)
Sodium: 139 mmol/L (ref 134–144)
TOTAL PROTEIN: 6.3 g/dL (ref 6.0–8.5)

## 2017-01-15 LAB — LIPID PANEL
Chol/HDL Ratio: 2.8 ratio (ref 0.0–4.4)
Cholesterol, Total: 139 mg/dL (ref 100–199)
HDL: 50 mg/dL (ref >39)
LDL Calculated: 57 mg/dL (ref 0–99)
TRIGLYCERIDES: 159 mg/dL — AB (ref 0–149)
VLDL CHOLESTEROL CAL: 32 mg/dL (ref 5–40)

## 2017-01-15 LAB — TSH: TSH: 8.68 u[IU]/mL — ABNORMAL HIGH (ref 0.450–4.500)

## 2017-01-15 NOTE — Telephone Encounter (Signed)
Phone call Discussed with patient multiple lab abnormalities with low platelet count elevated TSH low renal function and diabetes poor control patient will do better will recheck these labs in a couple of weeks.

## 2017-01-23 ENCOUNTER — Other Ambulatory Visit: Payer: Self-pay | Admitting: Family Medicine

## 2017-01-23 DIAGNOSIS — E1122 Type 2 diabetes mellitus with diabetic chronic kidney disease: Secondary | ICD-10-CM

## 2017-01-23 DIAGNOSIS — N183 Chronic kidney disease, stage 3 (moderate): Principal | ICD-10-CM

## 2017-01-23 NOTE — Telephone Encounter (Signed)
Last OV: 01/14/17 Next OV: 04/15/17   Lab Results  Component Value Date   HGBA1C 8.2 (H) 01/14/2017

## 2017-01-29 DIAGNOSIS — I129 Hypertensive chronic kidney disease with stage 1 through stage 4 chronic kidney disease, or unspecified chronic kidney disease: Secondary | ICD-10-CM | POA: Diagnosis not present

## 2017-01-29 DIAGNOSIS — I6523 Occlusion and stenosis of bilateral carotid arteries: Secondary | ICD-10-CM | POA: Diagnosis not present

## 2017-01-29 DIAGNOSIS — N2581 Secondary hyperparathyroidism of renal origin: Secondary | ICD-10-CM | POA: Diagnosis not present

## 2017-01-29 DIAGNOSIS — E1122 Type 2 diabetes mellitus with diabetic chronic kidney disease: Secondary | ICD-10-CM | POA: Diagnosis not present

## 2017-01-29 DIAGNOSIS — N2889 Other specified disorders of kidney and ureter: Secondary | ICD-10-CM | POA: Diagnosis not present

## 2017-01-29 DIAGNOSIS — N183 Chronic kidney disease, stage 3 (moderate): Secondary | ICD-10-CM | POA: Diagnosis not present

## 2017-01-29 DIAGNOSIS — R809 Proteinuria, unspecified: Secondary | ICD-10-CM | POA: Diagnosis not present

## 2017-01-30 DIAGNOSIS — I25118 Atherosclerotic heart disease of native coronary artery with other forms of angina pectoris: Secondary | ICD-10-CM | POA: Diagnosis not present

## 2017-01-30 DIAGNOSIS — I6523 Occlusion and stenosis of bilateral carotid arteries: Secondary | ICD-10-CM | POA: Diagnosis not present

## 2017-01-31 DIAGNOSIS — I2510 Atherosclerotic heart disease of native coronary artery without angina pectoris: Secondary | ICD-10-CM | POA: Diagnosis not present

## 2017-01-31 DIAGNOSIS — I340 Nonrheumatic mitral (valve) insufficiency: Secondary | ICD-10-CM | POA: Diagnosis not present

## 2017-01-31 DIAGNOSIS — I10 Essential (primary) hypertension: Secondary | ICD-10-CM | POA: Diagnosis not present

## 2017-01-31 DIAGNOSIS — I6523 Occlusion and stenosis of bilateral carotid arteries: Secondary | ICD-10-CM | POA: Diagnosis not present

## 2017-02-07 ENCOUNTER — Other Ambulatory Visit: Payer: PPO

## 2017-02-07 DIAGNOSIS — D696 Thrombocytopenia, unspecified: Secondary | ICD-10-CM | POA: Diagnosis not present

## 2017-02-07 DIAGNOSIS — R946 Abnormal results of thyroid function studies: Secondary | ICD-10-CM | POA: Diagnosis not present

## 2017-02-07 DIAGNOSIS — R7989 Other specified abnormal findings of blood chemistry: Secondary | ICD-10-CM

## 2017-02-08 LAB — CBC WITH DIFFERENTIAL/PLATELET
Basophils Absolute: 0 10*3/uL (ref 0.0–0.2)
Basos: 0 %
EOS (ABSOLUTE): 0.1 10*3/uL (ref 0.0–0.4)
Eos: 2 %
HEMATOCRIT: 43.5 % (ref 34.0–46.6)
Hemoglobin: 13.7 g/dL (ref 11.1–15.9)
IMMATURE GRANS (ABS): 0 10*3/uL (ref 0.0–0.1)
Immature Granulocytes: 0 %
LYMPHS ABS: 1.3 10*3/uL (ref 0.7–3.1)
LYMPHS: 29 %
MCH: 28.9 pg (ref 26.6–33.0)
MCHC: 31.5 g/dL (ref 31.5–35.7)
MCV: 92 fL (ref 79–97)
MONOCYTES: 10 %
Monocytes Absolute: 0.4 10*3/uL (ref 0.1–0.9)
NEUTROS ABS: 2.7 10*3/uL (ref 1.4–7.0)
Neutrophils: 59 %
Platelets: 79 10*3/uL — CL (ref 150–379)
RBC: 4.74 x10E6/uL (ref 3.77–5.28)
RDW: 14 % (ref 12.3–15.4)
WBC: 4.5 10*3/uL (ref 3.4–10.8)

## 2017-02-08 LAB — BASIC METABOLIC PANEL WITH GFR
BUN/Creatinine Ratio: 15 (ref 12–28)
BUN: 17 mg/dL (ref 8–27)
CO2: 24 mmol/L (ref 18–29)
Calcium: 8.8 mg/dL (ref 8.7–10.3)
Chloride: 103 mmol/L (ref 96–106)
Creatinine, Ser: 1.12 mg/dL — ABNORMAL HIGH (ref 0.57–1.00)
GFR calc Af Amer: 55 mL/min/{1.73_m2} — ABNORMAL LOW
GFR calc non Af Amer: 47 mL/min/{1.73_m2} — ABNORMAL LOW
Glucose: 196 mg/dL — ABNORMAL HIGH (ref 65–99)
Potassium: 3.7 mmol/L (ref 3.5–5.2)
Sodium: 142 mmol/L (ref 134–144)

## 2017-02-08 LAB — TSH: TSH: 5.25 u[IU]/mL — ABNORMAL HIGH (ref 0.450–4.500)

## 2017-02-10 ENCOUNTER — Telehealth: Payer: Self-pay | Admitting: Family Medicine

## 2017-02-10 ENCOUNTER — Encounter: Payer: Self-pay | Admitting: Family Medicine

## 2017-02-10 DIAGNOSIS — D696 Thrombocytopenia, unspecified: Secondary | ICD-10-CM

## 2017-02-10 DIAGNOSIS — D693 Immune thrombocytopenic purpura: Secondary | ICD-10-CM | POA: Insufficient documentation

## 2017-02-10 NOTE — Telephone Encounter (Signed)
Phone call Discussed with patient platelet count remains low because low platelets Will refer to hematology to further evaluate  Thyroid returning to  near normal will observe recheck TSH in a couple months

## 2017-02-11 ENCOUNTER — Telehealth: Payer: Self-pay | Admitting: Family Medicine

## 2017-02-11 NOTE — Telephone Encounter (Signed)
Message relayed to Mrs. Eulas Post.

## 2017-02-11 NOTE — Telephone Encounter (Signed)
Tia from [facility] called in regards to patient appointment. Tia asked if the patient could be contacted and informed about her appointment.    Stites number: [phone]  Patient's appointment is scheduled for:  May 18th at 3:00 pm with Dr. Janese Banks at [facility].  Thank you.

## 2017-02-28 ENCOUNTER — Ambulatory Visit: Payer: PPO

## 2017-02-28 ENCOUNTER — Encounter: Payer: Self-pay | Admitting: Oncology

## 2017-02-28 ENCOUNTER — Inpatient Hospital Stay: Payer: PPO | Attending: Oncology | Admitting: Oncology

## 2017-02-28 ENCOUNTER — Encounter (INDEPENDENT_AMBULATORY_CARE_PROVIDER_SITE_OTHER): Payer: Self-pay

## 2017-02-28 ENCOUNTER — Emergency Department
Admission: EM | Admit: 2017-02-28 | Discharge: 2017-02-28 | Disposition: A | Discharge status: Home / Self Care | Payer: PPO | Attending: Emergency Medicine | Admitting: Emergency Medicine

## 2017-02-28 ENCOUNTER — Encounter: Payer: Self-pay | Admitting: Emergency Medicine

## 2017-02-28 VITALS — BP 204/78 | HR 75 | Temp 97.4°F | Resp 18 | Ht 61.42 in | Wt 190.8 lb

## 2017-02-28 DIAGNOSIS — Z853 Personal history of malignant neoplasm of breast: Secondary | ICD-10-CM | POA: Insufficient documentation

## 2017-02-28 DIAGNOSIS — Z79899 Other long term (current) drug therapy: Secondary | ICD-10-CM

## 2017-02-28 DIAGNOSIS — I129 Hypertensive chronic kidney disease with stage 1 through stage 4 chronic kidney disease, or unspecified chronic kidney disease: Secondary | ICD-10-CM | POA: Diagnosis not present

## 2017-02-28 DIAGNOSIS — N2581 Secondary hyperparathyroidism of renal origin: Secondary | ICD-10-CM | POA: Diagnosis not present

## 2017-02-28 DIAGNOSIS — M8580 Other specified disorders of bone density and structure, unspecified site: Secondary | ICD-10-CM | POA: Insufficient documentation

## 2017-02-28 DIAGNOSIS — I10 Essential (primary) hypertension: Secondary | ICD-10-CM | POA: Diagnosis not present

## 2017-02-28 DIAGNOSIS — I2510 Atherosclerotic heart disease of native coronary artery without angina pectoris: Secondary | ICD-10-CM | POA: Insufficient documentation

## 2017-02-28 DIAGNOSIS — E785 Hyperlipidemia, unspecified: Secondary | ICD-10-CM | POA: Insufficient documentation

## 2017-02-28 DIAGNOSIS — D696 Thrombocytopenia, unspecified: Secondary | ICD-10-CM | POA: Insufficient documentation

## 2017-02-28 DIAGNOSIS — E039 Hypothyroidism, unspecified: Secondary | ICD-10-CM | POA: Insufficient documentation

## 2017-02-28 DIAGNOSIS — N183 Chronic kidney disease, stage 3 (moderate): Secondary | ICD-10-CM | POA: Diagnosis not present

## 2017-02-28 DIAGNOSIS — H409 Unspecified glaucoma: Secondary | ICD-10-CM | POA: Diagnosis not present

## 2017-02-28 DIAGNOSIS — Z7984 Long term (current) use of oral hypoglycemic drugs: Secondary | ICD-10-CM | POA: Insufficient documentation

## 2017-02-28 DIAGNOSIS — E1122 Type 2 diabetes mellitus with diabetic chronic kidney disease: Secondary | ICD-10-CM | POA: Insufficient documentation

## 2017-02-28 DIAGNOSIS — D631 Anemia in chronic kidney disease: Secondary | ICD-10-CM | POA: Insufficient documentation

## 2017-02-28 DIAGNOSIS — K219 Gastro-esophageal reflux disease without esophagitis: Secondary | ICD-10-CM | POA: Insufficient documentation

## 2017-02-28 LAB — CBC WITH DIFFERENTIAL/PLATELET
BASOS ABS: 0 10*3/uL (ref 0–0.1)
Basophils Relative: 1 %
EOS ABS: 0.1 10*3/uL (ref 0–0.7)
EOS PCT: 2 %
HCT: 43.9 % (ref 35.0–47.0)
Hemoglobin: 14.4 g/dL (ref 12.0–16.0)
Lymphocytes Relative: 26 %
Lymphs Abs: 1.4 10*3/uL (ref 1.0–3.6)
MCH: 28.7 pg (ref 26.0–34.0)
MCHC: 32.8 g/dL (ref 32.0–36.0)
MCV: 87.3 fL (ref 80.0–100.0)
Monocytes Absolute: 0.5 10*3/uL (ref 0.2–0.9)
Monocytes Relative: 10 %
Neutro Abs: 3.2 10*3/uL (ref 1.4–6.5)
Neutrophils Relative %: 61 %
PLATELETS: 81 10*3/uL — AB (ref 150–440)
RBC: 5.03 MIL/uL (ref 3.80–5.20)
RDW: 14.4 % (ref 11.5–14.5)
WBC: 5.3 10*3/uL (ref 3.6–11.0)

## 2017-02-28 LAB — COMPREHENSIVE METABOLIC PANEL
ALT: 35 U/L (ref 14–54)
AST: 41 U/L (ref 15–41)
Albumin: 3.9 g/dL (ref 3.5–5.0)
Alkaline Phosphatase: 57 U/L (ref 38–126)
Anion gap: 10 (ref 5–15)
BUN: 18 mg/dL (ref 6–20)
CO2: 25 mmol/L (ref 22–32)
CREATININE: 1.14 mg/dL — AB (ref 0.44–1.00)
Calcium: 8.8 mg/dL — ABNORMAL LOW (ref 8.9–10.3)
Chloride: 105 mmol/L (ref 101–111)
GFR, EST AFRICAN AMERICAN: 52 mL/min — AB (ref >60)
GFR, EST NON AFRICAN AMERICAN: 45 mL/min — AB (ref >60)
Glucose, Bld: 152 mg/dL — ABNORMAL HIGH (ref 65–99)
POTASSIUM: 3.1 mmol/L — AB (ref 3.5–5.1)
Sodium: 140 mmol/L (ref 135–145)
TOTAL PROTEIN: 7.1 g/dL (ref 6.5–8.1)
Total Bilirubin: 0.7 mg/dL (ref 0.3–1.2)

## 2017-02-28 MED ORDER — CLONIDINE HCL 0.1 MG PO TABS
0.2000 mg | ORAL_TABLET | Freq: Once | ORAL | Status: AC
  Administered 2017-02-28: 0.2 mg via ORAL
  Filled 2017-02-28: qty 2

## 2017-02-28 NOTE — ED Triage Notes (Signed)
Pt to ED from cancer center. Pt was being seen today for thrombocytopenia, pt was noted to be hypertensive. Pt denies headache or blurred vision. Pt states that she has hx/o HTN and takes blood pressure medication. Pt has not missed any doses of medication. Pt appears flushed and is hypertensive on triage.

## 2017-02-28 NOTE — Progress Notes (Signed)
Hematology/Oncology Consult note [facility] [phone] [phone]  Patient Care Team: Guadalupe Maple, MD as PCP - General (Family Medicine)   Name of the patient: Julia Nielsen  [id number]  1941-01-07    Reason for referral- thrombocytopenia   Referring physician- Dr. Jeananne Rama  Date of visit: 02/28/17   History of presenting illness- Patient is a 76 year old female who was been referred to [place] for evaluation and management of thrombocytopenia. Recent CBC from 02/07/2017 showed white count of 4.5, H&H of 13.7/43.5 and a platelet count of 79. Off note patient has chronic thrombocytopenia and a platelet count in 2015 was 80, February 2017 was 133 and April 2018 was 87. She has always had isolated thrombocytopenia and the absence of other cytopenias. CMP from 01/14/2017 was within normal limits except for mildly elevated ALT of 33.  Patient has CKD and has problems with knee pain. Otherwise doing well for her age. Denies any bleeding. Has occasional bruising  ECOG PS- 1-2  Pain scale- 2   Review of systems- Review of Systems  Constitutional: Negative for chills, fever, malaise/fatigue and weight loss.  HENT: Negative for congestion, ear discharge and nosebleeds.   Eyes: Negative for blurred vision.  Respiratory: Negative for cough, hemoptysis, sputum production, shortness of breath and wheezing.   Cardiovascular: Positive for leg swelling. Negative for chest pain, palpitations, orthopnea and claudication.  Gastrointestinal: Negative for abdominal pain, blood in stool, constipation, diarrhea, heartburn, melena, nausea and vomiting.  Genitourinary: Negative for dysuria, flank pain, frequency, hematuria and urgency.  Musculoskeletal: Positive for joint pain. Negative for back pain and myalgias.  Skin: Negative for rash.  Neurological: Negative for dizziness, tingling, focal weakness, seizures, weakness and headaches.  Endo/Heme/Allergies:  Does not bruise/bleed easily.  Psychiatric/Behavioral: Negative for depression and suicidal ideas. The patient does not have insomnia.     No Known Allergies  Patient Active Problem List   Diagnosis Date Noted  . Platelets decreased ([place]) 02/10/2017  . Hypothyroidism 12/11/2015  . Osteopenia 12/11/2015  . Multiple respiratory allergies 12/11/2015  . CAD (coronary artery disease) 07/05/2015  . Type 2 diabetes mellitus with stage 3 chronic kidney disease ([place]) 07/05/2015  . Benign hypertensive renal disease 07/05/2015  . Secondary hyperparathyroidism of renal origin ([place]) 07/05/2015  . Anemia of chronic renal failure 07/05/2015  . Hypertension 03/27/2015  . Hyperlipidemia 03/27/2015     Past Medical History:  Diagnosis Date  . Arthritis   . Breast cancer ([place]) 2000   left breast cancer, radiation  . CAD (coronary artery disease)   . Cataract   . Chronic kidney disease   . Diabetes mellitus without complication ([place])   . GERD (gastroesophageal reflux disease)   . Glaucoma   . Hyperlipidemia   . Hypertension   . Osteopenia   . Stenosis of carotid artery   . Thrombocytopenia ([place])      Past Surgical History:  Procedure Laterality Date  . APPENDECTOMY    . BLADDER SURGERY    . BREAST BIOPSY Left 2000  . BREAST SURGERY    . CARPAL TUNNEL RELEASE Bilateral   . CHOLECYSTECTOMY    . JOINT REPLACEMENT Right 2015   knee  . TUBAL LIGATION      Social History   Social History  . Marital status: Married    Spouse name: N/A  . Number of children: N/A  . Years of education: N/A   Occupational History  . Not on file.   Social History Main Topics  .  Smoking status: Never Smoker  . Smokeless tobacco: Never Used  . Alcohol use No  . Drug use: No  . Sexual activity: Not on file   Other Topics Concern  . Not on file   Social History Narrative  . No narrative on file     Family History  Problem Relation Age of Onset  . Cancer Sister   . Stroke Brother   .  Breast cancer Neg Hx      Current Outpatient Prescriptions:  .  amLODipine (NORVASC) 10 MG tablet, Take 1 tablet (10 mg total) by mouth daily., Disp: 90 tablet, Rfl: 4 .  benazepril (LOTENSIN) 40 MG tablet, Take 1 tablet (40 mg total) by mouth daily., Disp: 90 tablet, Rfl: 4 .  brimonidine (ALPHAGAN) 0.2 % ophthalmic solution, , Disp: , Rfl: 1 .  carvedilol (COREG) 25 MG tablet, Take 1 tablet (25 mg total) by mouth 2 (two) times daily with a meal., Disp: 90 tablet, Rfl: 4 .  cloNIDine (CATAPRES) 0.1 MG tablet, Take 1 tablet (0.1 mg total) by mouth 2 (two) times daily., Disp: 180 tablet, Rfl: 4 .  dorzolamide (TRUSOPT) 2 % ophthalmic solution, 1 drop 2 (two) times daily., Disp: , Rfl:  .  fluticasone (FLONASE) 50 MCG/ACT nasal spray, Place 2 sprays into both nostrils daily., Disp: 48 g, Rfl: 4 .  furosemide (LASIX) 20 MG tablet, Take 20 mg by mouth daily., Disp: , Rfl:  .  glipiZIDE (GLUCOTROL) 5 MG tablet, Take 1 tablet (5 mg total) by mouth 2 (two) times daily before a meal., Disp: 180 tablet, Rfl: 4 .  Lancets MISC, Dx E11.9  Use with test strips use any brand ins and pt prefer, Disp: 100 each, Rfl: 12 .  levothyroxine (SYNTHROID, LEVOTHROID) 150 MCG tablet, Take 1 tablet (150 mcg total) by mouth daily before breakfast., Disp: 90 tablet, Rfl: 4 .  lovastatin (MEVACOR) 40 MG tablet, Take 1 tablet (40 mg total) by mouth at bedtime., Disp: 90 tablet, Rfl: 4 .  ONE TOUCH ULTRA TEST test strip, USE ONE STRIP TO CHECK GLUCOSE ONCE DAILY, Disp: 100 each, Rfl: 25 .  raloxifene (EVISTA) 60 MG tablet, Take 1 tablet (60 mg total) by mouth daily., Disp: 90 tablet, Rfl: 4 .  sitaGLIPtin (JANUVIA) 100 MG tablet, Take 1 tablet (100 mg total) by mouth daily., Disp: 90 tablet, Rfl: 4 .  timolol (TIMOPTIC) 0.5 % ophthalmic solution, 1 drop 2 (two) times daily., Disp: , Rfl:    Physical exam:  Vitals:   02/28/17 1508  BP: (!) 204/78  Pulse: 75  Resp: 18  Temp: 97.4 F (36.3 C)  TempSrc: Tympanic    Weight: 190 lb 12.8 oz (86.5 kg)  Height: 5' 1.42" (1.56 m)   Physical Exam  Constitutional: She is oriented to person, place, and time and well-developed, well-nourished, and in no distress.  HENT:  Head: Normocephalic and atraumatic.  Eyes: EOM are normal. Pupils are equal, round, and reactive to light.  Neck: Normal range of motion.  Cardiovascular: Normal rate, regular rhythm and normal heart sounds.   Pulmonary/Chest: Effort normal and breath sounds normal.  Abdominal: Soft. Bowel sounds are normal.  Musculoskeletal: She exhibits edema.  Neurological: She is alert and oriented to person, place, and time.  Skin: Skin is warm and dry.       CMP Latest Ref Rng & Units 02/07/2017  Glucose 65 - 99 mg/dL 196(H)  BUN 8 - 27 mg/dL 17  Creatinine 0.57 - 1.00 mg/dL 1.12(H)  Sodium 134 - 144 mmol/L 142  Potassium 3.5 - 5.2 mmol/L 3.7  Chloride 96 - 106 mmol/L 103  CO2 18 - 29 mmol/L 24  Calcium 8.7 - 10.3 mg/dL 8.8  Total Protein 6.0 - 8.5 g/dL -  Total Bilirubin 0.0 - 1.2 mg/dL -  Alkaline Phos 39 - 117 IU/L -  AST 0 - 40 IU/L -  ALT 0 - 32 IU/L -   CBC Latest Ref Rng & Units 02/07/2017  WBC 3.4 - 10.8 x10E3/uL 4.5  Hemoglobin 12.0 - 16.0 g/dL -  Hematocrit 34.0 - 46.6 % 43.5  Platelets 150 - 379 x10E3/uL 79(LL)    No images are attached to the encounter.  No results found.  Assessment and plan- Patient is a 76 y.o. 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 Patient is on multiple chronic medications some of which like amlodipine can very rarely cause thrombocytopenia. But her thrombocytopenia is mild to moderate and there would be no need to modify any of her meds. Will check cbc with diff, pathology review of smear, b12, folate, HIV and Hep C testing. I will see her back in 2-3 weeks time  Uncontrolled HTN- Patient states that her BP typically runs between 426-834 systolic. She is asymptomatic  and did take her meds today. In view of her high BP which was confirmed on repeat testing, she will need to g to ER   Thank you for this kind referral and the opportunity to participate in the care of this patient   Visit Diagnosis 1. Thrombocytopenia ([place])     Dr. Randa Evens, MD, MPH [facility] Pager- [phone] 02/28/2017

## 2017-02-28 NOTE — ED Notes (Signed)
ED Provider at bedside. 

## 2017-02-28 NOTE — ED Provider Notes (Signed)
[facility] Emergency Department Provider Note  ____________________________________________   First MD Initiated Contact with Patient 02/28/17 1749     (approximate)  I have reviewed the triage vital signs and the nursing notes.   HISTORY  Chief Complaint Hypertension    HPI Julia Nielsen is a 76 y.o. female who sent to the emergency department from oncology clinic for asymptomatic hypertension. The patient has a long-standing history of hypertension that has been difficult to manage for which she takes Coreg, but as her parole, and clonidine. She also has a history of idiopathic thrombocytopenia purpura and was in clinic today for follow-up. When there she was noted to have a blood pressure in the low 200s and was advised to come to the emergency department. She denies chest pain shortness of breath abdominal pain nausea vomiting. She denies headache double vision or blurred vision. She says when she arrives to the emergency department "can I please go home".   Past Medical History:  Diagnosis Date  . Arthritis   . Breast cancer ([place]) 2000   left breast cancer, radiation  . CAD (coronary artery disease)   . Cataract   . Chronic kidney disease   . Diabetes mellitus without complication ([place])   . GERD (gastroesophageal reflux disease)   . Glaucoma   . Hyperlipidemia   . Hypertension   . Osteopenia   . Stenosis of carotid artery   . Thrombocytopenia [facility])     Patient Active Problem List   Diagnosis Date Noted  . Platelets decreased ([place]) 02/10/2017  . Hypothyroidism 12/11/2015  . Osteopenia 12/11/2015  . Multiple respiratory allergies 12/11/2015  . CAD (coronary artery disease) 07/05/2015  . Type 2 diabetes mellitus with stage 3 chronic kidney disease ([place]) 07/05/2015  . Benign hypertensive renal disease 07/05/2015  . Secondary hyperparathyroidism of renal origin ([place]) 07/05/2015  . Anemia of chronic renal failure 07/05/2015  . Hypertension  03/27/2015  . Hyperlipidemia 03/27/2015    Past Surgical History:  Procedure Laterality Date  . APPENDECTOMY    . BLADDER SURGERY    . BREAST BIOPSY Left 2000  . BREAST SURGERY    . CARPAL TUNNEL RELEASE Bilateral   . CHOLECYSTECTOMY    . JOINT REPLACEMENT Right 2015   knee  . TUBAL LIGATION      Prior to Admission medications   Medication Sig Start Date End Date Taking? Authorizing Provider  amLODipine (NORVASC) 10 MG tablet Take 1 tablet (10 mg total) by mouth daily. 01/14/17   Guadalupe Maple, MD  benazepril (LOTENSIN) 40 MG tablet Take 1 tablet (40 mg total) by mouth daily. 01/14/17   Guadalupe Maple, MD  brimonidine (ALPHAGAN) 0.2 % ophthalmic solution  02/03/15   [provider]  carvedilol (COREG) 25 MG tablet Take 1 tablet (25 mg total) by mouth 2 (two) times daily with a meal. 01/14/17   Crissman, Jeannette How, MD  cloNIDine (CATAPRES) 0.1 MG tablet Take 1 tablet (0.1 mg total) by mouth 2 (two) times daily. 01/14/17   Guadalupe Maple, MD  dorzolamide (TRUSOPT) 2 % ophthalmic solution 1 drop 2 (two) times daily.    [provider]  dorzolamide-timolol (COSOPT) 22.3-6.8 MG/ML ophthalmic solution dorzolamide 22.3 mg-timolol 6.8 mg/mL eye drops    [provider]  fluticasone (FLONASE) 50 MCG/ACT nasal spray Place 2 sprays into both nostrils daily. 01/14/17   Guadalupe Maple, MD  furosemide (LASIX) 20 MG tablet Take 20 mg by mouth daily.  [provider]  glipiZIDE (GLUCOTROL) 5 MG tablet Take 1 tablet (5 mg total) by mouth 2 (two) times daily before a meal. 01/14/17   Guadalupe Maple, MD  Lancets MISC Dx E11.9  Use with test strips use any brand ins and pt prefer 12/18/15   Guadalupe Maple, MD  latanoprost (XALATAN) 0.005 % ophthalmic solution  01/17/17   [provider]  levothyroxine (SYNTHROID, LEVOTHROID) 150 MCG tablet Take 1 tablet (150 mcg total) by mouth daily before breakfast. 01/14/17   Guadalupe Maple, MD  lovastatin (MEVACOR) 40 MG tablet  Take 1 tablet (40 mg total) by mouth at bedtime. 01/14/17   Guadalupe Maple, MD  ONE TOUCH ULTRA TEST test strip USE ONE STRIP TO CHECK GLUCOSE ONCE DAILY 01/23/17   Volney American, PA-C  raloxifene (EVISTA) 60 MG tablet Take 1 tablet (60 mg total) by mouth daily. 01/14/17   Guadalupe Maple, MD  sitaGLIPtin (JANUVIA) 100 MG tablet Take 1 tablet (100 mg total) by mouth daily. 01/14/17   Guadalupe Maple, MD  timolol (TIMOPTIC) 0.5 % ophthalmic solution 1 drop 2 (two) times daily.    [provider]    Allergies Patient has no known allergies.  Family History  Problem Relation Age of Onset  . Gallbladder disease Mother   . Aneurysm Mother   . COPD Father   . Congestive Heart Failure Father   . Cancer Sister   . Stroke Brother   . Congestive Heart Failure Sister   . Breast cancer Neg Hx     Social History Social History  Substance Use Topics  . Smoking status: Never Smoker  . Smokeless tobacco: Never Used  . Alcohol use No    Review of Systems Constitutional: No fever/chills Eyes: No visual changes. ENT: No sore throat. Cardiovascular: Denies chest pain. Respiratory: Denies shortness of breath. Gastrointestinal: No abdominal pain.  No nausea, no vomiting.  No diarrhea.  No constipation. Genitourinary: Negative for dysuria. Musculoskeletal: Negative for back pain. Skin: Negative for rash. Neurological: Negative for headaches, focal weakness or numbness.   ____________________________________________   PHYSICAL EXAM:  VITAL SIGNS: ED Triage Vitals [02/28/17 1603]  Enc Vitals Group     BP (!) 228/64     Pulse Rate 72     Resp 16     Temp 98.1 F (36.7 C)     Temp Source Oral     SpO2 96 %     Weight      Height      Head Circumference      Peak Flow      Pain Score      Pain Loc      Pain Edu?      Excl. in [place]?     Constitutional: Alert and oriented x 4 well appearing nontoxic no diaphoresis speaks in full, clear sentences Eyes: PERRL  EOMI. Head: Atraumatic. Nose: No congestion/rhinnorhea. Mouth/Throat: No trismus Neck: No stridor.   Cardiovascular: Normal rate, regular rhythm. Grossly normal heart sounds.  Good peripheral circulation. Respiratory: Normal respiratory effort.  No retractions. Lungs CTAB and moving good air Gastrointestinal: Soft nontender Musculoskeletal: No lower extremity edema   Neurologic:  Normal speech and language. No gross focal neurologic deficits are appreciated. Skin:  Skin is warm, dry and intact. No rash noted. Psychiatric: Mood and affect are normal. Speech and behavior are normal.    ____________________________________________  ____________________________________________   LABS (all labs ordered are listed, but only abnormal results  are displayed)  Labs Reviewed  COMPREHENSIVE METABOLIC PANEL - Abnormal; Notable for the following:       Result Value   Potassium 3.1 (*)    Glucose, Bld 152 (*)    Creatinine, Ser 1.14 (*)    Calcium 8.8 (*)    GFR calc non Af Amer 45 (*)    GFR calc Af Amer 52 (*)    All other components within normal limits  CBC WITH DIFFERENTIAL/PLATELET - Abnormal; Notable for the following:    Platelets 81 (*)    All other components within normal limits    No signs of acute kidney injury __________________________________________  EKG   ____________________________________________  RADIOLOGY   ____________________________________________   PROCEDURES  Procedure(s) performed: no  Procedures  Critical Care performed: no  Observation: no ____________________________________________   INITIAL IMPRESSION / ASSESSMENT AND PLAN / ED COURSE  Pertinent labs & imaging results that were available during my care of the patient were reviewed by me and considered in my medical decision making (see chart for details).  The patient arrives in the emergency department neuro intact and extremely well-appearing although with considerably elevated  blood pressure. Fortunately her renal function is at her baseline and she has no signs of end organ damage. She has missed her evening dose of clonidine so we'll give it to her now. She is asymptomatic there is no indication to acutely lower her blood pressure at this point and she says she is able to follow up on Monday for a recheck. She is discharged home in improved condition.      ____________________________________________   FINAL CLINICAL IMPRESSION(S) / ED DIAGNOSES  Final diagnoses:  Hypertension, unspecified type      NEW MEDICATIONS STARTED DURING THIS VISIT:  Discharge Medication List as of 02/28/2017  6:07 PM       Note:  This document was prepared using Dragon voice recognition software and may include unintentional dictation errors.     Darel Hong, MD 02/28/17 2003

## 2017-02-28 NOTE — Discharge Instructions (Signed)
Please take all of your blood pressure medications as prescribed and follow-up with your primary care physician on Monday for recheck. If you are unable to go see her primary care physician please come to our emergency department or to an urgent care for a recheck. Return to the emergency department sooner for any concerns such as headache, chest pain, shortness of breath, double vision, blurred vision, or for any other concerns.  It was a pleasure to take care of you today, and thank you for coming to our emergency department.  If you have any questions or concerns before leaving please ask the nurse to grab me and I'm more than happy to go through your aftercare instructions again.  If you were prescribed any opioid pain medication today such as Norco, Vicodin, Percocet, morphine, hydrocodone, or oxycodone please make sure you do not drive when you are taking this medication as it can alter your ability to drive safely.  If you have any concerns once you are home that you are not improving or are in fact getting worse before you can make it to your follow-up appointment, please do not hesitate to call [phone] and come back for further evaluation.  Darel Hong MD  Results for orders placed or performed during the hospital encounter of 02/28/17  Comprehensive metabolic panel  Result Value Ref Range   Sodium 140 135 - 145 mmol/L   Potassium 3.1 (L) 3.5 - 5.1 mmol/L   Chloride 105 101 - 111 mmol/L   CO2 25 22 - 32 mmol/L   Glucose, Bld 152 (H) 65 - 99 mg/dL   BUN 18 6 - 20 mg/dL   Creatinine, Ser 1.14 (H) 0.44 - 1.00 mg/dL   Calcium 8.8 (L) 8.9 - 10.3 mg/dL   Total Protein 7.1 6.5 - 8.1 g/dL   Albumin 3.9 3.5 - 5.0 g/dL   AST 41 15 - 41 U/L   ALT 35 14 - 54 U/L   Alkaline Phosphatase 57 38 - 126 U/L   Total Bilirubin 0.7 0.3 - 1.2 mg/dL   GFR calc non Af Amer 45 (L) >60 mL/min   GFR calc Af Amer 52 (L) >60 mL/min   Anion gap 10 5 - 15  CBC with Differential  Result Value Ref Range   WBC  5.3 3.6 - 11.0 K/uL   RBC 5.03 3.80 - 5.20 MIL/uL   Hemoglobin 14.4 12.0 - 16.0 g/dL   HCT 43.9 35.0 - 47.0 %   MCV 87.3 80.0 - 100.0 fL   MCH 28.7 26.0 - 34.0 pg   MCHC 32.8 32.0 - 36.0 g/dL   RDW 14.4 11.5 - 14.5 %   Platelets 81 (L) 150 - 440 K/uL   Neutrophils Relative % 61 %   Neutro Abs 3.2 1.4 - 6.5 K/uL   Lymphocytes Relative 26 %   Lymphs Abs 1.4 1.0 - 3.6 K/uL   Monocytes Relative 10 %   Monocytes Absolute 0.5 0.2 - 0.9 K/uL   Eosinophils Relative 2 %   Eosinophils Absolute 0.1 0 - 0.7 K/uL   Basophils Relative 1 %   Basophils Absolute 0.0 0 - 0.1 K/uL

## 2017-02-28 NOTE — Progress Notes (Signed)
Here for new pt evaluation.  

## 2017-03-21 ENCOUNTER — Inpatient Hospital Stay: Payer: PPO | Attending: Oncology | Admitting: Oncology

## 2017-03-21 VITALS — BP 152/78 | HR 71 | Temp 98.7°F | Wt 192.1 lb

## 2017-03-21 DIAGNOSIS — K219 Gastro-esophageal reflux disease without esophagitis: Secondary | ICD-10-CM | POA: Diagnosis not present

## 2017-03-21 DIAGNOSIS — Z7984 Long term (current) use of oral hypoglycemic drugs: Secondary | ICD-10-CM | POA: Diagnosis not present

## 2017-03-21 DIAGNOSIS — Z853 Personal history of malignant neoplasm of breast: Secondary | ICD-10-CM | POA: Diagnosis not present

## 2017-03-21 DIAGNOSIS — E1122 Type 2 diabetes mellitus with diabetic chronic kidney disease: Secondary | ICD-10-CM | POA: Diagnosis not present

## 2017-03-21 DIAGNOSIS — D696 Thrombocytopenia, unspecified: Secondary | ICD-10-CM | POA: Diagnosis not present

## 2017-03-21 DIAGNOSIS — I129 Hypertensive chronic kidney disease with stage 1 through stage 4 chronic kidney disease, or unspecified chronic kidney disease: Secondary | ICD-10-CM | POA: Insufficient documentation

## 2017-03-21 DIAGNOSIS — Z79899 Other long term (current) drug therapy: Secondary | ICD-10-CM | POA: Diagnosis not present

## 2017-03-21 DIAGNOSIS — M8580 Other specified disorders of bone density and structure, unspecified site: Secondary | ICD-10-CM | POA: Diagnosis not present

## 2017-03-21 DIAGNOSIS — I2510 Atherosclerotic heart disease of native coronary artery without angina pectoris: Secondary | ICD-10-CM | POA: Diagnosis not present

## 2017-03-21 DIAGNOSIS — H409 Unspecified glaucoma: Secondary | ICD-10-CM | POA: Insufficient documentation

## 2017-03-21 DIAGNOSIS — N189 Chronic kidney disease, unspecified: Secondary | ICD-10-CM | POA: Diagnosis not present

## 2017-03-21 DIAGNOSIS — E785 Hyperlipidemia, unspecified: Secondary | ICD-10-CM | POA: Insufficient documentation

## 2017-03-21 DIAGNOSIS — D693 Immune thrombocytopenic purpura: Secondary | ICD-10-CM

## 2017-03-21 NOTE — Progress Notes (Signed)
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 Recent CBC from 02/07/2017 showed white count of 4.5, H&H of 13.7/43.5 and a platelet count of 79. Off note patient has chronic thrombocytopenia and a platelet count in 2015 was 80, February 2017 was 133 and April 2018 was 87. She has always had isolated thrombocytopenia and the absence of other cytopenias. CMP from 01/14/2017 was within normal limits except for mildly elevated ALT of 33.  Patient has CKD and has problems with knee pain. Otherwise doing well for her age. Denies any bleeding. Has occasional bruising  Cbc from 5/18 showed platelet count of 81. Wbc and H/H normal. Bmp showed mildly abnormal creatinine of 1.14.   Interval history- doing well. Denies any complaints of bleeding or burising    Review of systems- Review of Systems  Constitutional: Negative for chills, fever, malaise/fatigue and weight loss.  HENT: Negative for congestion, ear discharge and nosebleeds.   Eyes: Negative for blurred vision.  Respiratory: Negative for cough, hemoptysis, sputum production, shortness of breath and wheezing.   Cardiovascular: Negative for chest pain, palpitations, orthopnea and claudication.  Gastrointestinal: Negative for abdominal pain, blood in stool, constipation, diarrhea, heartburn, melena, nausea and vomiting.  Genitourinary: Negative for dysuria, flank pain, frequency, hematuria and urgency.  Musculoskeletal: Negative for back pain, joint pain and myalgias.    Skin: Negative for rash.  Neurological: Negative for dizziness, tingling, focal weakness, seizures, weakness and headaches.  Endo/Heme/Allergies: Does not bruise/bleed easily.  Psychiatric/Behavioral: Negative for depression and suicidal ideas. The patient does not have insomnia.        No Known Allergies   Past Medical History:  Diagnosis Date  . Arthritis   . Breast cancer ([place]) 2000   left breast cancer, radiation  . CAD (coronary artery disease)   . Cataract   . Chronic kidney disease   . Diabetes mellitus without complication ([place])   . GERD (gastroesophageal reflux disease)   . Glaucoma   . Hyperlipidemia   . Hypertension   . Osteopenia   . Stenosis of carotid artery   . Thrombocytopenia ([place])      Past Surgical History:  Procedure Laterality Date  . APPENDECTOMY    . BLADDER SURGERY    . BREAST BIOPSY Left 2000  . BREAST SURGERY    . CARPAL TUNNEL RELEASE Bilateral   . CHOLECYSTECTOMY    . JOINT REPLACEMENT Right 2015   knee  . TUBAL LIGATION      Social History   Social History  . Marital status: Married    Spouse name: N/A  . Number of children: N/A  . Years of education: N/A   Occupational History  . Not on file.   Social History Main Topics  . Smoking status: Never Smoker  . Smokeless tobacco: Never Used  . Alcohol use No  . Drug use: No  . Sexual activity: Not on file   Other Topics Concern  . Not on file   Social History Narrative  . No narrative on file    Family History  Problem  Relation Age of Onset  . Gallbladder disease Mother   . Aneurysm Mother   . COPD Father   . Congestive Heart Failure Father   . Cancer Sister   . Stroke Brother   . Congestive Heart Failure Sister   . Breast cancer Neg Hx      Current Outpatient Prescriptions:  .  amLODipine (NORVASC) 10 MG tablet, Take 1 tablet (10 mg total) by mouth daily., Disp: 90 tablet, Rfl: 4 .  benazepril (LOTENSIN) 40 MG tablet, Take 1 tablet (40 mg total) by mouth  daily., Disp: 90 tablet, Rfl: 4 .  brimonidine (ALPHAGAN) 0.2 % ophthalmic solution, , Disp: , Rfl: 1 .  carvedilol (COREG) 25 MG tablet, Take 1 tablet (25 mg total) by mouth 2 (two) times daily with a meal., Disp: 90 tablet, Rfl: 4 .  cloNIDine (CATAPRES) 0.1 MG tablet, Take 1 tablet (0.1 mg total) by mouth 2 (two) times daily., Disp: 180 tablet, Rfl: 4 .  dorzolamide (TRUSOPT) 2 % ophthalmic solution, 1 drop 2 (two) times daily., Disp: , Rfl:  .  dorzolamide-timolol (COSOPT) 22.3-6.8 MG/ML ophthalmic solution, dorzolamide 22.3 mg-timolol 6.8 mg/mL eye drops, Disp: , Rfl:  .  fluticasone (FLONASE) 50 MCG/ACT nasal spray, Place 2 sprays into both nostrils daily., Disp: 48 g, Rfl: 4 .  furosemide (LASIX) 20 MG tablet, Take 20 mg by mouth daily., Disp: , Rfl:  .  glipiZIDE (GLUCOTROL) 5 MG tablet, Take 1 tablet (5 mg total) by mouth 2 (two) times daily before a meal., Disp: 180 tablet, Rfl: 4 .  Lancets MISC, Dx E11.9  Use with test strips use any brand ins and pt prefer, Disp: 100 each, Rfl: 12 .  latanoprost (XALATAN) 0.005 % ophthalmic solution, , Disp: , Rfl:  .  levothyroxine (SYNTHROID, LEVOTHROID) 150 MCG tablet, Take 1 tablet (150 mcg total) by mouth daily before breakfast., Disp: 90 tablet, Rfl: 4 .  lovastatin (MEVACOR) 40 MG tablet, Take 1 tablet (40 mg total) by mouth at bedtime., Disp: 90 tablet, Rfl: 4 .  ONE TOUCH ULTRA TEST test strip, USE ONE STRIP TO CHECK GLUCOSE ONCE DAILY, Disp: 100 each, Rfl: 25 .  raloxifene (EVISTA) 60 MG tablet, Take 1 tablet (60 mg total) by mouth daily., Disp: 90 tablet, Rfl: 4 .  sitaGLIPtin (JANUVIA) 100 MG tablet, Take 1 tablet (100 mg total) by mouth daily., Disp: 90 tablet, Rfl: 4 .  timolol (TIMOPTIC) 0.5 % ophthalmic solution, 1 drop 2 (two) times daily., Disp: , Rfl:   Physical exam:  Vitals:   03/21/17 1324  BP: (!) 152/78  Pulse: 71  Temp: 98.7 F (37.1 C)  TempSrc: Tympanic  Weight: 192 lb 2 oz (87.1 kg)   Physical Exam    Constitutional: She is oriented to person, place, and time and well-developed, well-nourished, and in no distress.  HENT:  Head: Normocephalic and atraumatic.  Eyes: EOM are normal. Pupils are equal, round, and reactive to light.  Neck: Normal range of motion.  Cardiovascular: Normal rate, regular rhythm and normal heart sounds.   Pulmonary/Chest: Effort normal and breath sounds normal.  Abdominal: Soft. Bowel sounds are normal.  Neurological: She is alert and oriented to person, place, and time.  Skin: Skin is warm and dry.     CMP Latest Ref Rng & Units 02/28/2017  Glucose 65 - 99 mg/dL 152(H)  BUN 6 - 20 mg/dL 18  Creatinine 0.44 - 1.00 mg/dL 1.14(H)  Sodium 135 - 145 mmol/L 140  Potassium 3.5 -  5.1 mmol/L 3.1(L)  Chloride 101 - 111 mmol/L 105  CO2 22 - 32 mmol/L 25  Calcium 8.9 - 10.3 mg/dL 8.8(L)  Total Protein 6.5 - 8.1 g/dL 7.1  Total Bilirubin 0.3 - 1.2 mg/dL 0.7  Alkaline Phos 38 - 126 U/L 57  AST 15 - 41 U/L 41  ALT 14 - 54 U/L 35   CBC Latest Ref Rng & Units 02/28/2017  WBC 3.6 - 11.0 K/uL 5.3  Hemoglobin 12.0 - 16.0 g/dL 14.4  Hematocrit 35.0 - 47.0 % 43.9  Platelets 150 - 440 K/uL 81(L)      Assessment and plan- Patient is a 76 y.o. 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 It waxes and wanes but has not been consistently trending down. Continue monitoring. Repeat cbc in 6 months and I will see her back in 1 year with cbc. Will check b12, folate in 6 months as well   Visit Diagnosis 1. Thrombocytopenia ([place])      Dr. Randa Evens, MD, MPH [facility] Pager- [phone] 03/21/2017 1:08 PM

## 2017-03-21 NOTE — Progress Notes (Signed)
Patient here today for follow up,  Patient c/o being fatigued

## 2017-03-23 ENCOUNTER — Encounter: Payer: Self-pay | Admitting: Oncology

## 2017-03-25 DIAGNOSIS — N2889 Other specified disorders of kidney and ureter: Secondary | ICD-10-CM | POA: Diagnosis not present

## 2017-04-15 ENCOUNTER — Ambulatory Visit: Payer: PPO | Admitting: Family Medicine

## 2017-04-21 ENCOUNTER — Other Ambulatory Visit: Payer: Self-pay | Admitting: Family Medicine

## 2017-04-21 DIAGNOSIS — Z1231 Encounter for screening mammogram for malignant neoplasm of breast: Secondary | ICD-10-CM

## 2017-04-22 ENCOUNTER — Ambulatory Visit (INDEPENDENT_AMBULATORY_CARE_PROVIDER_SITE_OTHER): Payer: PPO | Admitting: Family Medicine

## 2017-04-22 ENCOUNTER — Encounter: Payer: Self-pay | Admitting: Family Medicine

## 2017-04-22 DIAGNOSIS — N183 Chronic kidney disease, stage 3 (moderate): Secondary | ICD-10-CM

## 2017-04-22 DIAGNOSIS — I10 Essential (primary) hypertension: Secondary | ICD-10-CM | POA: Diagnosis not present

## 2017-04-22 DIAGNOSIS — I129 Hypertensive chronic kidney disease with stage 1 through stage 4 chronic kidney disease, or unspecified chronic kidney disease: Secondary | ICD-10-CM | POA: Diagnosis not present

## 2017-04-22 DIAGNOSIS — E1122 Type 2 diabetes mellitus with diabetic chronic kidney disease: Secondary | ICD-10-CM

## 2017-04-22 DIAGNOSIS — E785 Hyperlipidemia, unspecified: Secondary | ICD-10-CM | POA: Diagnosis not present

## 2017-04-22 LAB — BAYER DCA HB A1C WAIVED: HB A1C: 6.7 % (ref <7.0)

## 2017-04-22 NOTE — Assessment & Plan Note (Signed)
The current medical regimen is effective;  continue present plan and medications.  

## 2017-04-22 NOTE — Assessment & Plan Note (Signed)
Intermittently elevated blood pressure otherwise doing well noted low blood pressure episodes and possibly been aggravated by Evista.

## 2017-04-22 NOTE — Progress Notes (Signed)
BP (!) 160/74   Pulse 67   Wt 188 lb 3.2 oz (85.4 kg)   SpO2 99%   BMI 35.08 kg/m    Subjective:    Patient ID: Julia Nielsen, female    DOB: 12/28/1940, 76 y.o.   MRN: [id number]  HPI: Julia Nielsen is a 76 y.o. female  Chief Complaint  Patient presents with  . Follow-up  . Diabetes   Patient follow-up all in all doing well blood sugar doing good patients tightened up on her diet hemoglobin A1c was 8.2,  3 months ago today is down to 6.7. Patient's also lost 4 pounds. Patient's describing much better diet. Patient also eaten baked CABG and doing much better eating less.  Blood pressure home checking indicating good results. Patient's taking her medications today with no issues. Reviewed still taking Evista.  Relevant past medical, surgical, family and social history reviewed and updated as indicated. Interim medical history since our last visit reviewed. Allergies and medications reviewed and updated.  Review of Systems  Constitutional: Negative.   Respiratory: Negative.   Cardiovascular: Negative.     Per HPI unless specifically indicated above     Objective:    BP (!) 160/74   Pulse 67   Wt 188 lb 3.2 oz (85.4 kg)   SpO2 99%   BMI 35.08 kg/m   Wt Readings from Last 3 Encounters:  04/22/17 188 lb 3.2 oz (85.4 kg)  03/21/17 192 lb 2 oz (87.1 kg)  02/28/17 190 lb 12.8 oz (86.5 kg)    Physical Exam  Constitutional: She is oriented to person, place, and time. She appears well-developed and well-nourished.  HENT:  Head: Normocephalic and atraumatic.  Eyes: Conjunctivae and EOM are normal.  Neck: Normal range of motion.  Cardiovascular: Normal rate, regular rhythm and normal heart sounds.   Pulmonary/Chest: Effort normal and breath sounds normal.  Musculoskeletal: Normal range of motion.  Neurological: She is alert and oriented to person, place, and time.  Skin: No erythema.  Psychiatric: She has a normal mood and affect. Her behavior is normal. Judgment  and thought content normal.    Results for orders placed or performed during the hospital encounter of 02/28/17  Comprehensive metabolic panel  Result Value Ref Range   Sodium 140 135 - 145 mmol/L   Potassium 3.1 (L) 3.5 - 5.1 mmol/L   Chloride 105 101 - 111 mmol/L   CO2 25 22 - 32 mmol/L   Glucose, Bld 152 (H) 65 - 99 mg/dL   BUN 18 6 - 20 mg/dL   Creatinine, Ser 1.14 (H) 0.44 - 1.00 mg/dL   Calcium 8.8 (L) 8.9 - 10.3 mg/dL   Total Protein 7.1 6.5 - 8.1 g/dL   Albumin 3.9 3.5 - 5.0 g/dL   AST 41 15 - 41 U/L   ALT 35 14 - 54 U/L   Alkaline Phosphatase 57 38 - 126 U/L   Total Bilirubin 0.7 0.3 - 1.2 mg/dL   GFR calc non Af Amer 45 (L) >60 mL/min   GFR calc Af Amer 52 (L) >60 mL/min   Anion gap 10 5 - 15  CBC with Differential  Result Value Ref Range   WBC 5.3 3.6 - 11.0 K/uL   RBC 5.03 3.80 - 5.20 MIL/uL   Hemoglobin 14.4 12.0 - 16.0 g/dL   HCT 43.9 35.0 - 47.0 %   MCV 87.3 80.0 - 100.0 fL   MCH 28.7 26.0 - 34.0 pg   MCHC 32.8 32.0 -  36.0 g/dL   RDW 14.4 11.5 - 14.5 %   Platelets 81 (L) 150 - 440 K/uL   Neutrophils Relative % 61 %   Neutro Abs 3.2 1.4 - 6.5 K/uL   Lymphocytes Relative 26 %   Lymphs Abs 1.4 1.0 - 3.6 K/uL   Monocytes Relative 10 %   Monocytes Absolute 0.5 0.2 - 0.9 K/uL   Eosinophils Relative 2 %   Eosinophils Absolute 0.1 0 - 0.7 K/uL   Basophils Relative 1 %   Basophils Absolute 0.0 0 - 0.1 K/uL      Assessment & Plan:   Problem List Items Addressed This Visit      Cardiovascular and Mediastinum   Hypertension    Intermittently elevated blood pressure otherwise doing well noted low blood pressure episodes and possibly been aggravated by Evista.      Relevant Orders   Bayer DCA Hb A1c Waived     Endocrine   Type 2 diabetes mellitus with stage 3 chronic kidney disease ([place])    The current medical regimen is effective;  continue present plan and medications.       Relevant Orders   Bayer DCA Hb A1c Waived     Genitourinary   Benign  hypertensive renal disease    Blood pressure elevated here today but good home readings will observe and stop Evista and observe blood pressure. If not coming down we will need to reevaluate.        Other   Hyperlipidemia   Relevant Orders   Bayer DCA Hb A1c Waived       Follow up plan: Return in about 3 months (around 07/23/2017) for Hemoglobin A1c, BMP,  Lipids, ALT, AST.

## 2017-04-22 NOTE — Assessment & Plan Note (Signed)
Blood pressure elevated here today but good home readings will observe and stop Evista and observe blood pressure. If not coming down we will need to reevaluate.

## 2017-04-24 DIAGNOSIS — H401131 Primary open-angle glaucoma, bilateral, mild stage: Secondary | ICD-10-CM | POA: Diagnosis not present

## 2017-05-01 DIAGNOSIS — H401132 Primary open-angle glaucoma, bilateral, moderate stage: Secondary | ICD-10-CM | POA: Diagnosis not present

## 2017-05-01 LAB — HM DIABETES EYE EXAM

## 2017-05-27 ENCOUNTER — Ambulatory Visit
Admission: RE | Admit: 2017-05-27 | Discharge: 2017-05-27 | Disposition: A | Discharge status: Home / Self Care | Payer: PPO | Source: Ambulatory Visit | Attending: Family Medicine | Admitting: Family Medicine

## 2017-05-27 DIAGNOSIS — Z1231 Encounter for screening mammogram for malignant neoplasm of breast: Secondary | ICD-10-CM | POA: Diagnosis not present

## 2017-07-15 DIAGNOSIS — E1122 Type 2 diabetes mellitus with diabetic chronic kidney disease: Secondary | ICD-10-CM | POA: Diagnosis not present

## 2017-07-15 DIAGNOSIS — N183 Chronic kidney disease, stage 3 (moderate): Secondary | ICD-10-CM | POA: Diagnosis not present

## 2017-07-15 DIAGNOSIS — I129 Hypertensive chronic kidney disease with stage 1 through stage 4 chronic kidney disease, or unspecified chronic kidney disease: Secondary | ICD-10-CM | POA: Diagnosis not present

## 2017-07-15 DIAGNOSIS — R809 Proteinuria, unspecified: Secondary | ICD-10-CM | POA: Diagnosis not present

## 2017-07-15 DIAGNOSIS — N2581 Secondary hyperparathyroidism of renal origin: Secondary | ICD-10-CM | POA: Diagnosis not present

## 2017-08-06 DIAGNOSIS — R001 Bradycardia, unspecified: Secondary | ICD-10-CM | POA: Diagnosis not present

## 2017-08-06 DIAGNOSIS — I2510 Atherosclerotic heart disease of native coronary artery without angina pectoris: Secondary | ICD-10-CM | POA: Diagnosis not present

## 2017-08-06 DIAGNOSIS — I340 Nonrheumatic mitral (valve) insufficiency: Secondary | ICD-10-CM | POA: Diagnosis not present

## 2017-08-06 DIAGNOSIS — I6523 Occlusion and stenosis of bilateral carotid arteries: Secondary | ICD-10-CM | POA: Diagnosis not present

## 2017-08-06 DIAGNOSIS — R600 Localized edema: Secondary | ICD-10-CM | POA: Diagnosis not present

## 2017-08-06 DIAGNOSIS — N183 Chronic kidney disease, stage 3 (moderate): Secondary | ICD-10-CM | POA: Diagnosis not present

## 2017-08-27 ENCOUNTER — Ambulatory Visit: Payer: PPO | Admitting: Family Medicine

## 2017-08-27 ENCOUNTER — Encounter: Payer: Self-pay | Admitting: Family Medicine

## 2017-08-27 VITALS — BP 152/78 | HR 65 | Wt 189.0 lb

## 2017-08-27 DIAGNOSIS — E785 Hyperlipidemia, unspecified: Secondary | ICD-10-CM | POA: Diagnosis not present

## 2017-08-27 DIAGNOSIS — Z23 Encounter for immunization: Secondary | ICD-10-CM

## 2017-08-27 DIAGNOSIS — I10 Essential (primary) hypertension: Secondary | ICD-10-CM

## 2017-08-27 DIAGNOSIS — E1122 Type 2 diabetes mellitus with diabetic chronic kidney disease: Secondary | ICD-10-CM | POA: Diagnosis not present

## 2017-08-27 DIAGNOSIS — N183 Type 2 diabetes mellitus with diabetic chronic kidney disease: Secondary | ICD-10-CM

## 2017-08-27 LAB — LP+ALT+AST PICCOLO, WAIVED
ALT (SGPT) PICCOLO, WAIVED: 32 U/L (ref 10–47)
AST (SGOT) PICCOLO, WAIVED: 34 U/L (ref 11–38)
CHOLESTEROL PICCOLO, WAIVED: 141 mg/dL (ref <200)
Chol/HDL Ratio Piccolo,Waive: 2.7 mg/dL
HDL Chol Piccolo, Waived: 52 mg/dL — ABNORMAL LOW (ref >59)
LDL Chol Calc Piccolo Waived: 57 mg/dL (ref <100)
Triglycerides Piccolo,Waived: 162 mg/dL — ABNORMAL HIGH (ref <150)
VLDL Chol Calc Piccolo,Waive: 32 mg/dL — ABNORMAL HIGH (ref <30)

## 2017-08-27 LAB — BAYER DCA HB A1C WAIVED: HB A1C: 7.2 % — AB (ref <7.0)

## 2017-08-27 NOTE — Assessment & Plan Note (Signed)
The current medical regimen is effective;  continue present plan and medications.  

## 2017-08-27 NOTE — Progress Notes (Signed)
BP (!) 152/78   Pulse 65   Wt 189 lb (85.7 kg)   SpO2 98%   BMI 35.23 kg/m    Subjective:    Patient ID: Julia Nielsen, female    DOB: 06/07/1941, 76 y.o.   MRN: [id number]  HPI: Julia Nielsen is a 76 y.o. female  Chief Complaint  Patient presents with  . Diabetes   Patient follow-up has had a lot of stress with death in family but otherwise been doing well blood sugars been doing good with no issues or concerns. No low blood sugar spells. Blood pressure up a little bit today but otherwise been doing well with good control taking medications without problems or side effects. Cholesterol also doing well wplaints from medications and taking faithful.  Relevant past medical, surgical, family and social history reviewed and updated as indicated. Interim medical history since our last visit reviewed. Allergies and medications reviewed and updated.  Review of Systems  Constitutional: Negative.   Respiratory: Negative.   Cardiovascular: Negative.     Per HPI unless specifically indicated above     Objective:    BP (!) 152/78   Pulse 65   Wt 189 lb (85.7 kg)   SpO2 98%   BMI 35.23 kg/m   Wt Readings from Last 3 Encounters:  08/27/17 189 lb (85.7 kg)  04/22/17 188 lb 3.2 oz (85.4 kg)  03/21/17 192 lb 2 oz (87.1 kg)    Physical Exam  Constitutional: She is oriented to person, place, and time. She appears well-developed and well-nourished.  HENT:  Head: Normocephalic and atraumatic.  Eyes: Conjunctivae and EOM are normal.  Neck: Normal range of motion.  Cardiovascular: Normal rate, regular rhythm and normal heart sounds.  Pulmonary/Chest: Effort normal and breath sounds normal.  Musculoskeletal: Normal range of motion.  Neurological: She is alert and oriented to person, place, and time.  Skin: No erythema.  Psychiatric: She has a normal mood and affect. Her behavior is normal. Judgment and thought content normal.    Results for orders placed or performed in visit  on 05/01/17  HM DIABETES EYE EXAM  Result Value Ref Range   HM Diabetic Eye Exam No Retinopathy No Retinopathy      Assessment & Plan:   Problem List Items Addressed This Visit      Cardiovascular and Mediastinum   Hypertension - Primary    The current medical regimen is effective;  continue present plan and medications.       Relevant Orders   Basic metabolic panel   Bayer DCA Hb A1c Waived   LP+ALT+AST [place], [place]     Endocrine   Type 2 diabetes mellitus with stage 3 chronic kidney disease ([place])    The current medical regimen is effective;  continue present plan and medications.       Relevant Orders   Basic metabolic panel   Bayer DCA Hb A1c Waived   LP+ALT+AST [place], Waived     Other   Hyperlipidemia    The current medical regimen is effective;  continue present plan and medications.       Relevant Orders   Basic metabolic panel   Bayer DCA Hb A1c Waived   LP+ALT+AST [place], [place]    Other Visit Diagnoses    Needs flu shot       Relevant Orders   Flu vaccine HIGH DOSE PF (Fluzone High dose) (Completed)       Follow up plan: Return in about 3 months (  around 11/27/2017) for Hemoglobin A1c.

## 2017-08-28 LAB — BASIC METABOLIC PANEL
BUN / CREAT RATIO: 15 (ref 12–28)
BUN: 19 mg/dL (ref 8–27)
CALCIUM: 9.1 mg/dL (ref 8.7–10.3)
CO2: 23 mmol/L (ref 20–29)
Chloride: 100 mmol/L (ref 96–106)
Creatinine, Ser: 1.27 mg/dL — ABNORMAL HIGH (ref 0.57–1.00)
GFR, EST AFRICAN AMERICAN: 47 mL/min/{1.73_m2} — AB (ref >59)
GFR, EST NON AFRICAN AMERICAN: 41 mL/min/{1.73_m2} — AB (ref >59)
Glucose: 214 mg/dL — ABNORMAL HIGH (ref 65–99)
Potassium: 4 mmol/L (ref 3.5–5.2)
Sodium: 139 mmol/L (ref 134–144)

## 2017-08-29 ENCOUNTER — Encounter: Payer: Self-pay | Admitting: Family Medicine

## 2017-09-19 ENCOUNTER — Inpatient Hospital Stay: Payer: PPO | Attending: Oncology

## 2017-09-19 DIAGNOSIS — E1122 Type 2 diabetes mellitus with diabetic chronic kidney disease: Secondary | ICD-10-CM | POA: Insufficient documentation

## 2017-09-19 DIAGNOSIS — I129 Hypertensive chronic kidney disease with stage 1 through stage 4 chronic kidney disease, or unspecified chronic kidney disease: Secondary | ICD-10-CM | POA: Diagnosis not present

## 2017-09-19 DIAGNOSIS — Z7984 Long term (current) use of oral hypoglycemic drugs: Secondary | ICD-10-CM | POA: Insufficient documentation

## 2017-09-19 DIAGNOSIS — D693 Immune thrombocytopenic purpura: Secondary | ICD-10-CM

## 2017-09-19 DIAGNOSIS — N189 Chronic kidney disease, unspecified: Secondary | ICD-10-CM | POA: Diagnosis not present

## 2017-09-19 DIAGNOSIS — E785 Hyperlipidemia, unspecified: Secondary | ICD-10-CM | POA: Insufficient documentation

## 2017-09-19 DIAGNOSIS — K219 Gastro-esophageal reflux disease without esophagitis: Secondary | ICD-10-CM | POA: Diagnosis not present

## 2017-09-19 DIAGNOSIS — I2510 Atherosclerotic heart disease of native coronary artery without angina pectoris: Secondary | ICD-10-CM | POA: Diagnosis not present

## 2017-09-19 DIAGNOSIS — M8580 Other specified disorders of bone density and structure, unspecified site: Secondary | ICD-10-CM | POA: Insufficient documentation

## 2017-09-19 DIAGNOSIS — Z79899 Other long term (current) drug therapy: Secondary | ICD-10-CM | POA: Diagnosis not present

## 2017-09-19 DIAGNOSIS — H409 Unspecified glaucoma: Secondary | ICD-10-CM | POA: Diagnosis not present

## 2017-09-19 DIAGNOSIS — D696 Thrombocytopenia, unspecified: Secondary | ICD-10-CM | POA: Diagnosis not present

## 2017-09-19 DIAGNOSIS — Z853 Personal history of malignant neoplasm of breast: Secondary | ICD-10-CM | POA: Insufficient documentation

## 2017-09-19 LAB — VITAMIN B12: Vitamin B-12: 372 pg/mL (ref 180–914)

## 2017-09-19 LAB — CBC
HEMATOCRIT: 41.2 % (ref 35.0–47.0)
Hemoglobin: 13.6 g/dL (ref 12.0–16.0)
MCH: 29.4 pg (ref 26.0–34.0)
MCHC: 33.1 g/dL (ref 32.0–36.0)
MCV: 88.8 fL (ref 80.0–100.0)
Platelets: 87 10*3/uL — ABNORMAL LOW (ref 150–440)
RBC: 4.64 MIL/uL (ref 3.80–5.20)
RDW: 14.4 % (ref 11.5–14.5)
WBC: 4 10*3/uL (ref 3.6–11.0)

## 2017-09-19 LAB — FOLATE: Folate: 17.2 ng/mL (ref >5.9)

## 2017-10-10 ENCOUNTER — Encounter: Payer: Self-pay | Admitting: Family Medicine

## 2017-11-10 ENCOUNTER — Encounter: Payer: Self-pay | Admitting: Family Medicine

## 2017-11-10 ENCOUNTER — Ambulatory Visit (INDEPENDENT_AMBULATORY_CARE_PROVIDER_SITE_OTHER): Payer: PPO | Admitting: Family Medicine

## 2017-11-10 VITALS — BP 179/81 | HR 102 | Temp 99.5°F | Wt 185.0 lb

## 2017-11-10 DIAGNOSIS — J019 Acute sinusitis, unspecified: Secondary | ICD-10-CM | POA: Diagnosis not present

## 2017-11-10 MED ORDER — BENZONATATE 100 MG PO CAPS: 100.0000 mg | ORAL_CAPSULE | Freq: Two times a day (BID) | ORAL | 0 refills | Status: DC | PRN

## 2017-11-10 MED ORDER — AMOXICILLIN 875 MG PO TABS: 875.0000 mg | ORAL_TABLET | Freq: Two times a day (BID) | ORAL | 0 refills | Status: DC

## 2017-11-10 NOTE — Progress Notes (Signed)
   BP (!) 179/81   Pulse (!) 102   Temp 99.5 F (37.5 C) (Oral)   Wt 185 lb (83.9 kg)   SpO2 98%   BMI 34.48 kg/m    Subjective:    Patient ID: Julia Nielsen, female    DOB: 09-16-1941, 77 y.o.   MRN: [id number]  HPI: Julia Nielsen is a 77 y.o. female  Chief Complaint  Patient presents with  . Cough  . Sinus Problem  Patient with marked sinus congestion drainage feeling bands been ongoing for a couple of weeks getting worse husband with similar symptoms been on amoxicillin and getting better. Has used over-the-counter Mucinex Mucinex D Tylenol issues with some improvement but still just getting worse.  Relevant past medical, surgical, family and social history reviewed and updated as indicated. Interim medical history since our last visit reviewed. Allergies and medications reviewed and updated.  Review of Systems  Constitutional: Positive for chills, diaphoresis and fatigue.  HENT: Positive for ear pain, rhinorrhea, sinus pressure, sinus pain, sneezing and sore throat.   Respiratory: Negative.   Cardiovascular: Negative.     Per HPI unless specifically indicated above     Objective:    BP (!) 179/81   Pulse (!) 102   Temp 99.5 F (37.5 C) (Oral)   Wt 185 lb (83.9 kg)   SpO2 98%   BMI 34.48 kg/m   Wt Readings from Last 3 Encounters:  11/10/17 185 lb (83.9 kg)  08/27/17 189 lb (85.7 kg)  04/22/17 188 lb 3.2 oz (85.4 kg)    Physical Exam  Constitutional: She is oriented to person, place, and time. She appears well-developed and well-nourished.  HENT:  Head: Normocephalic and atraumatic.  Mouth/Throat: Oropharyngeal exudate present.  Left ear slightly inflamed tympanic membrane  Eyes: Conjunctivae and EOM are normal.  Neck: Normal range of motion.  Cardiovascular: Normal rate, regular rhythm and normal heart sounds.  Pulmonary/Chest: Effort normal and breath sounds normal.  Musculoskeletal: Normal range of motion.  Neurological: She is alert and oriented to  person, place, and time.  Skin: No erythema.  Psychiatric: She has a normal mood and affect. Her behavior is normal. Judgment and thought content normal.    Results for orders placed or performed in visit on 09/19/17  CBC  Result Value Ref Range   WBC 4.0 3.6 - 11.0 K/uL   RBC 4.64 3.80 - 5.20 MIL/uL   Hemoglobin 13.6 12.0 - 16.0 g/dL   HCT 41.2 35.0 - 47.0 %   MCV 88.8 80.0 - 100.0 fL   MCH 29.4 26.0 - 34.0 pg   MCHC 33.1 32.0 - 36.0 g/dL   RDW 14.4 11.5 - 14.5 %   Platelets 87 (L) 150 - 440 K/uL  Folate  Result Value Ref Range   Folate 17.2 >5.9 ng/mL  Vitamin B12  Result Value Ref Range   Vitamin B-12 372 180 - 914 pg/mL      Assessment & Plan:   Problem List Items Addressed This Visit    None    Visit Diagnoses    Acute sinusitis, recurrence not specified, unspecified location    -  Primary   Relevant Medications   amoxicillin (AMOXIL) 875 MG tablet   benzonatate (TESSALON) 100 MG capsule      Discussed sinusitis care and treatment use of medications Tylenol, Mucinex, nasal rinse Follow up plan: Return if symptoms worsen or fail to improve, for As scheduled.

## 2017-11-17 DIAGNOSIS — H401132 Primary open-angle glaucoma, bilateral, moderate stage: Secondary | ICD-10-CM | POA: Diagnosis not present

## 2017-12-01 ENCOUNTER — Ambulatory Visit: Payer: PPO | Admitting: Family Medicine

## 2017-12-02 ENCOUNTER — Ambulatory Visit (INDEPENDENT_AMBULATORY_CARE_PROVIDER_SITE_OTHER): Payer: PPO | Admitting: Family Medicine

## 2017-12-02 ENCOUNTER — Encounter: Payer: Self-pay | Admitting: Family Medicine

## 2017-12-02 VITALS — BP 168/75 | HR 71 | Wt 188.0 lb

## 2017-12-02 DIAGNOSIS — E1122 Type 2 diabetes mellitus with diabetic chronic kidney disease: Secondary | ICD-10-CM

## 2017-12-02 DIAGNOSIS — N183 Chronic kidney disease, stage 3 (moderate): Secondary | ICD-10-CM | POA: Diagnosis not present

## 2017-12-02 DIAGNOSIS — I10 Essential (primary) hypertension: Secondary | ICD-10-CM

## 2017-12-02 DIAGNOSIS — Z7189 Other specified counseling: Secondary | ICD-10-CM | POA: Diagnosis not present

## 2017-12-02 LAB — BAYER DCA HB A1C WAIVED: HB A1C (BAYER DCA - WAIVED): 6.9 % (ref <7.0)

## 2017-12-02 NOTE — Progress Notes (Signed)
BP (!) 168/75   Pulse 71   Wt 188 lb (85.3 kg)   SpO2 98%   BMI 35.04 kg/m    Subjective:    Patient ID: Julia Nielsen, female    DOB: July 29, 1941, 77 y.o.   MRN: [id number]  HPI: Julia Nielsen is a 77 y.o. female  Chief Complaint  Patient presents with  . Follow-up    Last physical 01/14/17  . Diabetes  Patient follow-up diabetes actually doing well.  Low blood sugar spells Blood pressure still markedly elevated patient with under tremendous amount of stress.  Relevant past medical, surgical, family and social history reviewed and updated as indicated. Interim medical history since our last visit reviewed. Allergies and medications reviewed and updated.  Review of Systems  Constitutional: Negative.   Respiratory: Negative.   Cardiovascular: Negative.     Per HPI unless specifically indicated above     Objective:    BP (!) 168/75   Pulse 71   Wt 188 lb (85.3 kg)   SpO2 98%   BMI 35.04 kg/m   Wt Readings from Last 3 Encounters:  12/02/17 188 lb (85.3 kg)  11/10/17 185 lb (83.9 kg)  08/27/17 189 lb (85.7 kg)    Physical Exam  Constitutional: She is oriented to person, place, and time. She appears well-developed and well-nourished.  HENT:  Head: Normocephalic and atraumatic.  Eyes: Conjunctivae and EOM are normal.  Neck: Normal range of motion.  Cardiovascular: Normal rate, regular rhythm and normal heart sounds.  Pulmonary/Chest: Effort normal and breath sounds normal.  Musculoskeletal: Normal range of motion.  Neurological: She is alert and oriented to person, place, and time.  Skin: No erythema.  Psychiatric: She has a normal mood and affect. Her behavior is normal. Judgment and thought content normal.    Results for orders placed or performed in visit on 09/19/17  CBC  Result Value Ref Range   WBC 4.0 3.6 - 11.0 K/uL   RBC 4.64 3.80 - 5.20 MIL/uL   Hemoglobin 13.6 12.0 - 16.0 g/dL   HCT 41.2 35.0 - 47.0 %   MCV 88.8 80.0 - 100.0 fL   MCH 29.4  26.0 - 34.0 pg   MCHC 33.1 32.0 - 36.0 g/dL   RDW 14.4 11.5 - 14.5 %   Platelets 87 (L) 150 - 440 K/uL  Folate  Result Value Ref Range   Folate 17.2 >5.9 ng/mL  Vitamin B12  Result Value Ref Range   Vitamin B-12 372 180 - 914 pg/mL      Assessment & Plan:   Problem List Items Addressed This Visit      Cardiovascular and Mediastinum   Hypertension - Primary    Discuss hypertension poor control patient following up with nephrology next month will check blood pressure with them.  Also discussed stress control and reduction.      Relevant Orders   Bayer DCA Hb A1c Waived     Endocrine   Type 2 diabetes mellitus with stage 3 chronic kidney disease ([place])    The current medical regimen is effective;  continue present plan and medications.       Relevant Orders   Bayer DCA Hb A1c Waived     Other   Advanced care planning/counseling discussion    A voluntary discussion about advance care planning including the explanation and discussion of advance directives was extensively discussed  with the patient.  Explanation about the health care proxy and Living will was reviewed and  packet with forms with explanation of how to fill them out was given.    Time spent: encounter 16+ min       Individuals present. pt           Follow up plan: Return in about 3 months (around 03/01/2018) for Physical Exam, Hemoglobin A1c.

## 2017-12-02 NOTE — Assessment & Plan Note (Signed)
The current medical regimen is effective;  continue present plan and medications.  

## 2017-12-02 NOTE — Assessment & Plan Note (Signed)
Discuss hypertension poor control patient following up with nephrology next month will check blood pressure with them.  Also discussed stress control and reduction.

## 2017-12-02 NOTE — Assessment & Plan Note (Signed)
A voluntary discussion about advance care planning including the explanation and discussion of advance directives was extensively discussed  with the patient.  Explanation about the health care proxy and Living will was reviewed and packet with forms with explanation of how to fill them out was given.    Time spent: encounter 16+ min       Individuals present. pt

## 2018-01-21 DIAGNOSIS — N2889 Other specified disorders of kidney and ureter: Secondary | ICD-10-CM | POA: Diagnosis not present

## 2018-01-21 DIAGNOSIS — I129 Hypertensive chronic kidney disease with stage 1 through stage 4 chronic kidney disease, or unspecified chronic kidney disease: Secondary | ICD-10-CM | POA: Diagnosis not present

## 2018-01-21 DIAGNOSIS — R809 Proteinuria, unspecified: Secondary | ICD-10-CM | POA: Diagnosis not present

## 2018-01-21 DIAGNOSIS — E1122 Type 2 diabetes mellitus with diabetic chronic kidney disease: Secondary | ICD-10-CM | POA: Diagnosis not present

## 2018-01-21 DIAGNOSIS — N2581 Secondary hyperparathyroidism of renal origin: Secondary | ICD-10-CM | POA: Diagnosis not present

## 2018-01-21 DIAGNOSIS — N183 Chronic kidney disease, stage 3 (moderate): Secondary | ICD-10-CM | POA: Diagnosis not present

## 2018-01-27 ENCOUNTER — Other Ambulatory Visit: Payer: Self-pay | Admitting: Family Medicine

## 2018-01-27 DIAGNOSIS — J309 Allergic rhinitis, unspecified: Secondary | ICD-10-CM

## 2018-01-27 NOTE — Telephone Encounter (Signed)
Flonase nasal spray refill request  LOV 01/14/17 with Dr. Jeananne Rama where this was addressed.  [address], [place] [address]

## 2018-02-04 ENCOUNTER — Other Ambulatory Visit: Payer: Self-pay | Admitting: Family Medicine

## 2018-02-04 DIAGNOSIS — I10 Essential (primary) hypertension: Secondary | ICD-10-CM

## 2018-02-10 DIAGNOSIS — I2510 Atherosclerotic heart disease of native coronary artery without angina pectoris: Secondary | ICD-10-CM | POA: Diagnosis not present

## 2018-02-10 DIAGNOSIS — E782 Mixed hyperlipidemia: Secondary | ICD-10-CM | POA: Diagnosis not present

## 2018-02-10 DIAGNOSIS — N183 Chronic kidney disease, stage 3 (moderate): Secondary | ICD-10-CM | POA: Diagnosis not present

## 2018-02-10 DIAGNOSIS — I10 Essential (primary) hypertension: Secondary | ICD-10-CM | POA: Diagnosis not present

## 2018-02-10 DIAGNOSIS — R600 Localized edema: Secondary | ICD-10-CM | POA: Diagnosis not present

## 2018-02-25 ENCOUNTER — Other Ambulatory Visit: Payer: Self-pay | Admitting: Family Medicine

## 2018-02-25 DIAGNOSIS — E1122 Type 2 diabetes mellitus with diabetic chronic kidney disease: Secondary | ICD-10-CM

## 2018-02-25 DIAGNOSIS — N183 Chronic kidney disease, stage 3 (moderate): Principal | ICD-10-CM

## 2018-03-02 ENCOUNTER — Other Ambulatory Visit: Payer: Self-pay | Admitting: Family Medicine

## 2018-03-02 DIAGNOSIS — E1122 Type 2 diabetes mellitus with diabetic chronic kidney disease: Secondary | ICD-10-CM

## 2018-03-02 DIAGNOSIS — N183 Chronic kidney disease, stage 3 (moderate): Principal | ICD-10-CM

## 2018-03-17 ENCOUNTER — Other Ambulatory Visit: Payer: Self-pay | Admitting: Nephrology

## 2018-03-17 DIAGNOSIS — N183 Chronic kidney disease, stage 3 unspecified: Secondary | ICD-10-CM

## 2018-03-20 ENCOUNTER — Encounter: Payer: Self-pay | Admitting: Oncology

## 2018-03-20 ENCOUNTER — Inpatient Hospital Stay (HOSPITAL_BASED_OUTPATIENT_CLINIC_OR_DEPARTMENT_OTHER): Payer: PPO | Admitting: Oncology

## 2018-03-20 ENCOUNTER — Inpatient Hospital Stay: Payer: PPO | Attending: Oncology

## 2018-03-20 VITALS — BP 139/79 | HR 80 | Temp 97.6°F | Resp 18 | Ht 61.42 in | Wt 187.6 lb

## 2018-03-20 DIAGNOSIS — D693 Immune thrombocytopenic purpura: Secondary | ICD-10-CM

## 2018-03-20 DIAGNOSIS — D696 Thrombocytopenia, unspecified: Secondary | ICD-10-CM | POA: Diagnosis not present

## 2018-03-20 LAB — CBC
HEMATOCRIT: 38.1 % (ref 35.0–47.0)
HEMOGLOBIN: 12.8 g/dL (ref 12.0–16.0)
MCH: 29.9 pg (ref 26.0–34.0)
MCHC: 33.6 g/dL (ref 32.0–36.0)
MCV: 89 fL (ref 80.0–100.0)
Platelets: 79 10*3/uL — ABNORMAL LOW (ref 150–440)
RBC: 4.28 MIL/uL (ref 3.80–5.20)
RDW: 14.1 % (ref 11.5–14.5)
WBC: 4.1 10*3/uL (ref 3.6–11.0)

## 2018-03-20 NOTE — Progress Notes (Signed)
No new changes noted today 

## 2018-03-20 NOTE — Progress Notes (Signed)
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 Recent CBC from 02/07/2017 showed white count of 4.5, H&H of 13.7/43.5 and a platelet count of 79. Off note patient has chronic thrombocytopenia and a platelet count in 2015 was 80, February 2017 was 133 and April 2018 was 87. She has always had isolated thrombocytopenia and the absence of other cytopenias. CMP from 01/14/2017 was within normal limits except for mildly elevated ALT of 33.   Cbc from 5/18 showed platelet count of 81. Wbc and H/H normal. Bmp showed mildly abnormal creatinine of 1.14.    Interval history-patient is doing well and denies any complaints today.  Her appetite is good she denies any fatigue or unintentional weight loss.  Denies any bleeding.  She has occasional bruises on her forearms which are mild and self-limited.  ECOG PS- 1 Pain scale- 0   Review of systems- Review of Systems  Constitutional: Negative for chills, fever, malaise/fatigue and weight loss.  HENT: Negative for congestion, ear discharge and nosebleeds.   Eyes: Negative for blurred vision.  Respiratory: Negative for cough, hemoptysis, sputum production, shortness of breath and wheezing.   Cardiovascular: Negative for chest pain, palpitations, orthopnea and claudication.  Gastrointestinal: Negative for abdominal pain, blood in stool, constipation, diarrhea, heartburn, melena, nausea and vomiting.  Genitourinary: Negative for dysuria, flank pain, frequency, hematuria  and urgency.  Musculoskeletal: Negative for back pain, joint pain and myalgias.  Skin: Negative for rash.  Neurological: Negative for dizziness, tingling, focal weakness, seizures, weakness and headaches.  Endo/Heme/Allergies: Does not bruise/bleed easily.  Psychiatric/Behavioral: Negative for depression and suicidal ideas. The patient does not have insomnia.       No Known Allergies   Past Medical History:  Diagnosis Date  . Arthritis   . Breast cancer ([place]) 2000   left breast cancer, radiation  . CAD (coronary artery disease)   . Cataract   . Chronic kidney disease   . Diabetes mellitus without complication ([place])   . GERD (gastroesophageal reflux disease)   . Glaucoma   . Hyperlipidemia   . Hypertension   . Osteopenia   . Stenosis of carotid artery   . Thrombocytopenia ([place])      Past Surgical History:  Procedure Laterality Date  . APPENDECTOMY    . BLADDER SURGERY    . BREAST EXCISIONAL BIOPSY Left 2000   breast ca  . BREAST SURGERY    . CARPAL TUNNEL RELEASE Bilateral   . CHOLECYSTECTOMY    . JOINT REPLACEMENT Right 2015   knee  . TUBAL LIGATION      Social History   Socioeconomic History  . Marital status: Married    Spouse name: Not on file  . Number of children: Not on file  . Years of education: Not on file  . Highest education level: Not on file  Occupational History  . Not on file  Social Needs  . Financial resource strain: Not on file  . Food insecurity:    Worry: Not on file    Inability: Not on file  .  Transportation needs:    Medical: Not on file    Non-medical: Not on file  Tobacco Use  . Smoking status: Never Smoker  . Smokeless tobacco: Never Used  Substance and Sexual Activity  . Alcohol use: No  . Drug use: No  . Sexual activity: Not on file  Lifestyle  . Physical activity:    Days per week: Not on file    Minutes per session: Not on file  . Stress: Not on file  Relationships  . Social connections:    Talks on phone: Not  on file    Gets together: Not on file    Attends religious service: Not on file    Active member of club or organization: Not on file    Attends meetings of clubs or organizations: Not on file    Relationship status: Not on file  . Intimate partner violence:    Fear of current or ex partner: Not on file    Emotionally abused: Not on file    Physically abused: Not on file    Forced sexual activity: Not on file  Other Topics Concern  . Not on file  Social History Narrative  . Not on file    Family History  Problem Relation Age of Onset  . Gallbladder disease Mother   . Aneurysm Mother   . COPD Father   . Congestive Heart Failure Father   . Cancer Sister   . Stroke Brother   . Congestive Heart Failure Sister   . Breast cancer Neg Hx      Current Outpatient Medications:  .  amLODipine (NORVASC) 10 MG tablet, Take 1 tablet (10 mg total) by mouth daily., Disp: 90 tablet, Rfl: 4 .  benazepril (LOTENSIN) 40 MG tablet, Take 1 tablet (40 mg total) by mouth daily., Disp: 90 tablet, Rfl: 4 .  brimonidine (ALPHAGAN) 0.2 % ophthalmic solution, , Disp: , Rfl: 1 .  carvedilol (COREG) 25 MG tablet, Take 1 tablet (25 mg total) by mouth 2 (two) times daily with a meal., Disp: 90 tablet, Rfl: 4 .  cloNIDine (CATAPRES) 0.1 MG tablet, TAKE 1 TABLET BY MOUTH TWICE DAILY, Disp: 180 tablet, Rfl: 1 .  dorzolamide-timolol (COSOPT) 22.3-6.8 MG/ML ophthalmic solution, dorzolamide 22.3 mg-timolol 6.8 mg/mL eye drops, Disp: , Rfl:  .  furosemide (LASIX) 20 MG tablet, Take 20 mg by mouth daily., Disp: , Rfl:  .  glipiZIDE (GLUCOTROL) 5 MG tablet, Take 1 tablet (5 mg total) by mouth 2 (two) times daily before a meal., Disp: 180 tablet, Rfl: 4 .  JANUVIA 100 MG tablet, TAKE 1 TABLET BY MOUTH ONCE DAILY, Disp: 90 tablet, Rfl: 4 .  latanoprost (XALATAN) 0.005 % ophthalmic solution, , Disp: , Rfl:  .  levothyroxine (SYNTHROID, LEVOTHROID) 150 MCG tablet, Take 1 tablet (150 mcg total) by mouth daily before  breakfast., Disp: 90 tablet, Rfl: 4 .  lovastatin (MEVACOR) 40 MG tablet, Take 1 tablet (40 mg total) by mouth at bedtime., Disp: 90 tablet, Rfl: 4 .  fluticasone (FLONASE) 50 MCG/ACT nasal spray, USE 2 SPRAYS IN EACH NOSTRIL ONCE DAILY (Patient not taking: Reported on 03/20/2018), Disp: 16 g, Rfl: 4 .  Lancets MISC, Dx E11.9  Use with test strips use any brand ins and pt prefer (Patient not taking: Reported on 03/20/2018), Disp: 100 each, Rfl: 12 .  ONE TOUCH ULTRA TEST test strip, USE TO CHECK BLOOD SUGAR ONCE DAILY (Patient not taking: Reported on 03/20/2018), Disp: 100 each, Rfl: 12  Physical  exam:  Vitals:   03/20/18 0936  BP: 139/79  Pulse: 80  Resp: 18  Temp: 97.6 F (36.4 C)  TempSrc: Tympanic  SpO2: 97%  Weight: 187 lb 9.6 oz (85.1 kg)  Height: 5' 1.42" (1.56 m)   Physical Exam  Constitutional: She is oriented to person, place, and time. She appears well-developed and well-nourished.  HENT:  Head: Normocephalic and atraumatic.  Eyes: Pupils are equal, round, and reactive to light. EOM are normal.  Neck: Normal range of motion.  Cardiovascular: Normal rate, regular rhythm and normal heart sounds.  Pulmonary/Chest: Effort normal and breath sounds normal.  Abdominal: Soft. Bowel sounds are normal.  Neurological: She is alert and oriented to person, place, and time.  Skin: Skin is warm and dry.     CMP Latest Ref Rng & Units 08/27/2017  Glucose 65 - 99 mg/dL 214(H)  BUN 8 - 27 mg/dL 19  Creatinine 0.57 - 1.00 mg/dL 1.27(H)  Sodium 134 - 144 mmol/L 139  Potassium 3.5 - 5.2 mmol/L 4.0  Chloride 96 - 106 mmol/L 100  CO2 20 - 29 mmol/L 23  Calcium 8.7 - 10.3 mg/dL 9.1  Total Protein 6.5 - 8.1 g/dL -  Total Bilirubin 0.3 - 1.2 mg/dL -  Alkaline Phos 38 - 126 U/L -  AST 11 - 38 U/L 34  ALT 10 - 47 U/L 32   CBC Latest Ref Rng & Units 03/20/2018  WBC 3.6 - 11.0 K/uL 4.1  Hemoglobin 12.0 - 16.0 g/dL 12.8  Hematocrit 35.0 - 47.0 % 38.1  Platelets 150 - 440 K/uL 79(L)       Assessment and plan- Patient is a 77 y.o. female referred for thrombocytopenia likely secondary to ITP  Patient's platelet count was 80 back in 2015 and then transiently went up to 133 and 2017.  Over the last 1 year her platelet count has remained stable between 70s to 80s and is 79 today.  She has isolated thrombocytopenia in the absence of any leukopenia or anemia.  This is likely secondary to ITP and can be monitored conservatively and she does not need any treatment for her ITP unless her platelet counts are less than 30.  Given the stability of her platelet count over the last 1 year she can continue to follow-up with her primary care Dr. Jeananne Rama at this time who can monitor her platelet counts every 6 months.  She can be referred back to [place] in the future if her platelet counts are consistently trending down to less than 50   Visit Diagnosis 1. Thrombocytopenia ([place])      Dr. Randa Evens, MD, MPH [facility] at [facility] [phone] 03/20/2018 2:50 PM

## 2018-03-23 ENCOUNTER — Other Ambulatory Visit: Payer: Self-pay | Admitting: *Deleted

## 2018-03-23 ENCOUNTER — Other Ambulatory Visit: Payer: Self-pay | Admitting: Family Medicine

## 2018-03-23 DIAGNOSIS — I10 Essential (primary) hypertension: Secondary | ICD-10-CM

## 2018-03-23 DIAGNOSIS — E039 Hypothyroidism, unspecified: Secondary | ICD-10-CM

## 2018-03-23 DIAGNOSIS — E1122 Type 2 diabetes mellitus with diabetic chronic kidney disease: Secondary | ICD-10-CM

## 2018-03-23 DIAGNOSIS — N183 Chronic kidney disease, stage 3 (moderate): Secondary | ICD-10-CM

## 2018-03-23 DIAGNOSIS — I2510 Atherosclerotic heart disease of native coronary artery without angina pectoris: Secondary | ICD-10-CM

## 2018-03-23 MED ORDER — CLONIDINE HCL 0.1 MG PO TABS: 0.1000 mg | ORAL_TABLET | Freq: Two times a day (BID) | ORAL | 0 refills | Status: DC

## 2018-03-23 MED ORDER — AMLODIPINE BESYLATE 10 MG PO TABS: 10.0000 mg | ORAL_TABLET | Freq: Every day | ORAL | 3 refills | Status: DC

## 2018-03-23 MED ORDER — BENAZEPRIL HCL 40 MG PO TABS: 40.0000 mg | ORAL_TABLET | Freq: Every day | ORAL | 3 refills | Status: DC

## 2018-03-23 NOTE — Progress Notes (Signed)
Medications were deleted with duplicate Coreg- they have been pulled back into the patient's medication list and pharmacy with appropriate refills.

## 2018-03-24 ENCOUNTER — Ambulatory Visit
Admission: RE | Admit: 2018-03-24 | Discharge: 2018-03-24 | Disposition: A | Discharge status: Home / Self Care | Payer: PPO | Source: Ambulatory Visit | Attending: Nephrology | Admitting: Nephrology

## 2018-03-24 DIAGNOSIS — N183 Chronic kidney disease, stage 3 unspecified: Secondary | ICD-10-CM

## 2018-03-24 DIAGNOSIS — N200 Calculus of kidney: Secondary | ICD-10-CM | POA: Insufficient documentation

## 2018-03-24 DIAGNOSIS — N189 Chronic kidney disease, unspecified: Secondary | ICD-10-CM | POA: Diagnosis not present

## 2018-03-24 DIAGNOSIS — N281 Cyst of kidney, acquired: Secondary | ICD-10-CM | POA: Insufficient documentation

## 2018-04-10 ENCOUNTER — Ambulatory Visit (INDEPENDENT_AMBULATORY_CARE_PROVIDER_SITE_OTHER): Payer: PPO

## 2018-04-10 ENCOUNTER — Other Ambulatory Visit: Payer: Self-pay | Admitting: Unknown Physician Specialty

## 2018-04-10 VITALS — BP 140/78 | HR 70 | Temp 98.2°F | Resp 16 | Ht 60.5 in | Wt 189.5 lb

## 2018-04-10 DIAGNOSIS — Z1211 Encounter for screening for malignant neoplasm of colon: Secondary | ICD-10-CM | POA: Diagnosis not present

## 2018-04-10 DIAGNOSIS — Z0000 Encounter for general adult medical examination without abnormal findings: Secondary | ICD-10-CM | POA: Diagnosis not present

## 2018-04-10 NOTE — Progress Notes (Signed)
Subjective:   Julia Nielsen is a 77 y.o. female who presents for Medicare Annual (Subsequent) preventive examination.  Review of Systems:   Cardiac Risk Factors include: advanced age (>77men, >61 women);dyslipidemia;hypertension;obesity (BMI >30kg/m2)     Objective:     Vitals: BP 140/78 (BP Location: Left Arm, Patient Position: Sitting)   Pulse 70   Temp 98.2 F (36.8 C) (Temporal)   Resp 16   Ht 5' 0.5" (1.537 m)   Wt 189 lb 8 oz (86 kg)   BMI 36.40 kg/m   Body mass index is 36.4 kg/m.  Advanced Directives 04/10/2018 03/21/2017 02/28/2017 02/28/2017  Does Patient Have a Medical Advance Directive? No No No No  Would patient like information on creating a medical advance directive? Yes (MAU/Ambulatory/Procedural Areas - Information given) Yes (MAU/Ambulatory/Procedural Areas - Information given) No - Patient declined -    Tobacco Social History   Tobacco Use  Smoking Status Never Smoker  Smokeless Tobacco Never Used     Counseling given: Not Answered   Clinical Intake:  Pre-visit preparation completed: Yes  Pain : No/denies pain Pain Score: 0-No pain     Nutritional Status: BMI > 30  Obese Nutritional Risks: None Diabetes: Yes CBG done?: No Did pt. bring in CBG monitor from home?: No  How often do you need to have someone help you when you read instructions, pamphlets, or other written materials from your doctor or pharmacy?: 1 - Never What is the last grade level you completed in school?: high school   Interpreter Needed?: No  Information entered by :: Lincon Sahlin,LPN   Past Medical History:  Diagnosis Date  . Arthritis   . Breast cancer ([place]) 2000   left breast cancer, radiation  . CAD (coronary artery disease)   . Cataract   . Chronic kidney disease   . Diabetes mellitus without complication ([place])   . GERD (gastroesophageal reflux disease)   . Glaucoma   . Hyperlipidemia   . Hypertension   . Osteopenia   . Stenosis of carotid artery   .  Thrombocytopenia ([place])    Past Surgical History:  Procedure Laterality Date  . APPENDECTOMY    . BLADDER SURGERY    . BREAST EXCISIONAL BIOPSY Left 2000   breast ca  . BREAST SURGERY    . CARPAL TUNNEL RELEASE Bilateral   . CHOLECYSTECTOMY    . JOINT REPLACEMENT Right 2015   knee  . TUBAL LIGATION     Family History  Problem Relation Age of Onset  . Gallbladder disease Mother   . Aneurysm Mother   . COPD Father   . Congestive Heart Failure Father   . Emphysema Father   . Leukemia Sister   . Stroke Brother   . Dementia Brother   . Alzheimer's disease Brother   . Congestive Heart Failure Sister   . Emphysema Sister   . Breast cancer Neg Hx    Social History   Socioeconomic History  . Marital status: Married    Spouse name: Not on file  . Number of children: Not on file  . Years of education: Not on file  . Highest education level: Not on file  Occupational History  . Not on file  Social Needs  . Financial resource strain: Not very hard  . Food insecurity:    Worry: Never true    Inability: Never true  . Transportation needs:    Medical: No    Non-medical: No  Tobacco Use  .  Smoking status: Never Smoker  . Smokeless tobacco: Never Used  Substance and Sexual Activity  . Alcohol use: No  . Drug use: No  . Sexual activity: Not on file  Lifestyle  . Physical activity:    Days per week: 0 days    Minutes per session: 0 min  . Stress: Not at all  Relationships  . Social connections:    Talks on phone: More than three times a week    Gets together: Once a week    Attends religious service: Never    Active member of club or organization: No    Attends meetings of clubs or organizations: Never    Relationship status: Married  Other Topics Concern  . Not on file  Social History Narrative  . Not on file    Outpatient Encounter Medications as of 04/10/2018  Medication Sig  . amLODipine (NORVASC) 10 MG tablet Take 1 tablet (10 mg total) by mouth daily.  .  benazepril (LOTENSIN) 40 MG tablet Take 1 tablet (40 mg total) by mouth daily.  . brimonidine (ALPHAGAN) 0.2 % ophthalmic solution   . carvedilol (COREG) 25 MG tablet TAKE ONE-HALF TABLET BY MOUTH TWICE DAILY WITH A MEAL  . cloNIDine (CATAPRES) 0.1 MG tablet Take 1 tablet (0.1 mg total) by mouth 2 (two) times daily.  . dorzolamide-timolol (COSOPT) 22.3-6.8 MG/ML ophthalmic solution dorzolamide 22.3 mg-timolol 6.8 mg/mL eye drops  . fluticasone (FLONASE) 50 MCG/ACT nasal spray USE 2 SPRAYS IN EACH NOSTRIL ONCE DAILY  . furosemide (LASIX) 20 MG tablet Take 20 mg by mouth daily.  Marland Kitchen glipiZIDE (GLUCOTROL) 5 MG tablet TAKE 1 TABLET BY MOUTH TWICE DAILY BEFORE MEALS  . JANUVIA 100 MG tablet TAKE 1 TABLET BY MOUTH ONCE DAILY  . Lancets MISC Dx E11.9  Use with test strips use any brand ins and pt prefer  . latanoprost (XALATAN) 0.005 % ophthalmic solution   . levothyroxine (SYNTHROID, LEVOTHROID) 150 MCG tablet TAKE 1 TABLET BY MOUTH ONCE DAILY BEFORE BREAKFAST  . lovastatin (MEVACOR) 40 MG tablet Take 1 tablet (40 mg total) by mouth at bedtime.  . ONE TOUCH ULTRA TEST test strip USE TO CHECK BLOOD SUGAR ONCE DAILY   No facility-administered encounter medications on file as of 04/10/2018.     Activities of Daily Living In your present state of health, do you have any difficulty performing the following activities: 04/10/2018  Hearing? N  Vision? N  Difficulty concentrating or making decisions? N  Walking or climbing stairs? N  Dressing or bathing? N  Doing errands, shopping? N  Preparing Food and eating ? N  Using the Toilet? N  In the past six months, have you accidently leaked urine? Y  Comment wears protection   Do you have problems with loss of bowel control? N  Managing your Medications? N  Managing your Finances? N  Housekeeping or managing your Housekeeping? N  Some recent data might be hidden    Patient Care Team: Guadalupe Maple, MD as PCP - General (Family Medicine)      Assessment:   This is a routine wellness examination for Julia Nielsen.  Exercise Activities and Dietary recommendations Current Exercise Habits: Home exercise routine, Time (Minutes): 30, Frequency (Times/Week): 7, Weekly Exercise (Minutes/Week): 210, Intensity: Mild, Exercise limited by: None identified  Goals    . DIET - INCREASE WATER INTAKE     Recommend continue drinking at least 6-8 glasses of water a day        Fall Risk  Fall Risk  04/10/2018 11/10/2017 08/27/2017 04/22/2017 12/11/2015  Falls in the past year? No No No No No   Is the patient's home free of loose throw rugs in walkways, pet beds, electrical cords, etc?   yes      Grab bars in the bathroom? yes      Handrails on the stairs?   no stairs       Adequate lighting?   yes  Timed Get Up and Go performed: Completed in 9 seconds with use of assistive devices- cane, steady gait. No intervention needed at this time.   Depression Screen PHQ 2/9 Scores 04/10/2018 11/10/2017 08/27/2017 04/22/2017  PHQ - 2 Score 0 0 0 0     Cognitive Function     6CIT Screen 04/10/2018  What Year? 0 points  What month? 0 points  What time? 0 points  Count back from 20 0 points  Months in reverse 0 points  Repeat phrase 0 points  Total Score 0    Immunization History  Administered Date(s) Administered  . Influenza, High Dose Seasonal PF 06/18/2016, 08/27/2017  . Influenza,inj,Quad PF,6+ Mos 07/05/2015  . Pneumococcal Conjugate-13 12/08/2014  . Pneumococcal-Unspecified 08/15/1999, 06/18/2005  . Td 01/23/2009    Qualifies for Shingles Vaccine? Yes, discussed shingrix vaccine  Screening Tests Health Maintenance  Topic Date Due  . MAMMOGRAM  11/27/2017  . FOOT EXAM  01/14/2018  . COLONOSCOPY  02/05/2018  . OPHTHALMOLOGY EXAM  05/01/2018  . INFLUENZA VACCINE  05/14/2018  . HEMOGLOBIN A1C  06/01/2018  . TETANUS/TDAP  01/24/2019  . DEXA SCAN  Completed  . PNA vac Low Risk Adult  Completed    Cancer Screenings: Lung: Low Dose CT  Chest recommended if Age 43-80 years, 30 pack-year currently smoking OR have quit w/in 15years. Patient does not qualify. Breast:  Up to date on Mammogram? No  ordered Up to date of Bone Density/Dexa? Yes completed 03/15/2014 Colorectal: not indicated   Additional Screening: Hepatitis C Screening: not indicated      Plan:    I have personally reviewed and addressed the Medicare Annual Wellness questionnaire and have noted the following in the patient's chart:  A. Medical and social history B. Use of alcohol, tobacco or illicit drugs  C. Current medications and supplements D. Functional ability and status E.  Nutritional status F.  Physical activity G. Advance directives H. List of other physicians I.  Hospitalizations, surgeries, and ER visits in previous 12 months J.  [place] such as hearing and vision if needed, cognitive and depression L. Referrals and appointments   In addition, I have reviewed and discussed with patient certain preventive protocols, quality metrics, and best practice recommendations. A written personalized care plan for preventive services as well as general preventive health recommendations were provided to patient.   Signed,  Tyler Aas, LPN Nurse Health Advisor   Nurse Notes: due for diabetic foot exam- cpe on 04/20/2018 with Dr.Crissman.

## 2018-04-10 NOTE — Patient Instructions (Addendum)
Julia Nielsen , Thank you for taking time to come for your Medicare Wellness Visit. I appreciate your ongoing commitment to your health goals. Please review the following plan we discussed and let me know if I can assist you in the future.   Screening recommendations/referrals: Colonoscopy: Dr.Elliotts office will contact you.  Mammogram: Please call [phone] to schedule your mammogram.  Bone Density: completed 03/15/2014 Recommended yearly ophthalmology/optometry visit for glaucoma screening and checkup Recommended yearly dental visit for hygiene and checkup  Vaccinations: Influenza vaccine: due 06/2018 Pneumococcal vaccine: up to date  Tdap vaccine: up to date  Shingles vaccine: shingrix eligible, check with your insurance company for coverage   Advanced directives: Advance directive discussed with you today. I have provided a copy for you to complete at home and have notarized. Once this is complete please bring a copy in to our office so we can scan it into your chart.  Conditions/risks identified: Recommend continue drinking at least 6-8 glasses of water a day   Next appointment: Follow up on 04/20/2018 at 9:00am with H[place]. Follow up in one year for your annual wellness exam.    Preventive Care 65 Years and Older, Female Preventive care refers to lifestyle choices and visits with your health care provider that can promote health and wellness. What does preventive care include?  A yearly physical exam. This is also called an annual well check.  Dental exams once or twice a year.  Routine eye exams. Ask your health care provider how often you should have your eyes checked.  Personal lifestyle choices, including:  Daily care of your teeth and gums.  Regular physical activity.  Eating a healthy diet.  Avoiding tobacco and drug use.  Limiting alcohol use.  Practicing safe sex.  Taking low-dose aspirin every day.  Taking vitamin and mineral supplements as  recommended by your health care provider. What happens during an annual well check? The services and screenings done by your health care provider during your annual well check will depend on your age, overall health, lifestyle risk factors, and family history of disease. Counseling  Your health care provider may ask you questions about your:  Alcohol use.  Tobacco use.  Drug use.  Emotional well-being.  Home and relationship well-being.  Sexual activity.  Eating habits.  History of falls.  Memory and ability to understand (cognition).  Work and work Statistician.  Reproductive health. Screening  You may have the following tests or measurements:  Height, weight, and BMI.  Blood pressure.  Lipid and cholesterol levels. These may be checked every 5 years, or more frequently if you are over 72 years old.  Skin check.  Lung cancer screening. You may have this screening every year starting at age 25 if you have a 30-pack-year history of smoking and currently smoke or have quit within the past 15 years.  Fecal occult blood test (FOBT) of the stool. You may have this test every year starting at age 66.  Flexible sigmoidoscopy or colonoscopy. You may have a sigmoidoscopy every 5 years or a colonoscopy every 10 years starting at age 61.  Hepatitis C blood test.  Hepatitis B blood test.  Sexually transmitted disease (STD) testing.  Diabetes screening. This is done by checking your blood sugar (glucose) after you have not eaten for a while (fasting). You may have this done every 1-3 years.  Bone density scan. This is done to screen for osteoporosis. You may have this done starting at age 22.  Mammogram. This  may be done every 1-2 years. Talk to your health care provider about how often you should have regular mammograms. Talk with your health care provider about your test results, treatment options, and if necessary, the need for more tests. Vaccines  Your health care  provider may recommend certain vaccines, such as:  Influenza vaccine. This is recommended every year.  Tetanus, diphtheria, and acellular pertussis (Tdap, Td) vaccine. You may need a Td booster every 10 years.  Zoster vaccine. You may need this after age 75.  Pneumococcal 13-valent conjugate (PCV13) vaccine. One dose is recommended after age 76.  Pneumococcal polysaccharide (PPSV23) vaccine. One dose is recommended after age 14. Talk to your health care provider about which screenings and vaccines you need and how often you need them. This information is not intended to replace advice given to you by your health care provider. Make sure you discuss any questions you have with your health care provider. Document Released: 10/27/2015 Document Revised: 06/19/2016 Document Reviewed: 08/01/2015 Elsevier Interactive Patient Education  2017 [place] Prevention in the Home Falls can cause injuries. They can happen to people of all ages. There are many things you can do to make your home safe and to help prevent falls. What can I do on the outside of my home?  Regularly fix the edges of walkways and driveways and fix any cracks.  Remove anything that might make you trip as you walk through a door, such as a raised step or threshold.  Trim any bushes or trees on the path to your home.  Use bright outdoor lighting.  Clear any walking paths of anything that might make someone trip, such as rocks or tools.  Regularly check to see if handrails are loose or broken. Make sure that both sides of any steps have handrails.  Any raised decks and porches should have guardrails on the edges.  Have any leaves, snow, or ice cleared regularly.  Use sand or salt on walking paths during winter.  Clean up any spills in your garage right away. This includes oil or grease spills. What can I do in the bathroom?  Use night lights.  Install grab bars by the toilet and in the tub and shower. Do  not use towel bars as grab bars.  Use non-skid mats or decals in the tub or shower.  If you need to sit down in the shower, use a plastic, non-slip stool.  Keep the floor dry. Clean up any water that spills on the floor as soon as it happens.  Remove soap buildup in the tub or shower regularly.  Attach bath mats securely with double-sided non-slip rug tape.  Do not have throw rugs and other things on the floor that can make you trip. What can I do in the bedroom?  Use night lights.  Make sure that you have a light by your bed that is easy to reach.  Do not use any sheets or blankets that are too big for your bed. They should not hang down onto the floor.  Have a firm chair that has side arms. You can use this for support while you get dressed.  Do not have throw rugs and other things on the floor that can make you trip. What can I do in the kitchen?  Clean up any spills right away.  Avoid walking on wet floors.  Keep items that you use a lot in easy-to-reach places.  If you need to reach something above  you, use a strong step stool that has a grab bar.  Keep electrical cords out of the way.  Do not use floor polish or wax that makes floors slippery. If you must use wax, use non-skid floor wax.  Do not have throw rugs and other things on the floor that can make you trip. What can I do with my stairs?  Do not leave any items on the stairs.  Make sure that there are handrails on both sides of the stairs and use them. Fix handrails that are broken or loose. Make sure that handrails are as long as the stairways.  Check any carpeting to make sure that it is firmly attached to the stairs. Fix any carpet that is loose or worn.  Avoid having throw rugs at the top or bottom of the stairs. If you do have throw rugs, attach them to the floor with carpet tape.  Make sure that you have a light switch at the top of the stairs and the bottom of the stairs. If you do not have them,  ask someone to add them for you. What else can I do to help prevent falls?  Wear shoes that:  Do not have high heels.  Have rubber bottoms.  Are comfortable and fit you well.  Are closed at the toe. Do not wear sandals.  If you use a stepladder:  Make sure that it is fully opened. Do not climb a closed stepladder.  Make sure that both sides of the stepladder are locked into place.  Ask someone to hold it for you, if possible.  Clearly mark and make sure that you can see:  Any grab bars or handrails.  First and last steps.  Where the edge of each step is.  Use tools that help you move around (mobility aids) if they are needed. These include:  Canes.  Walkers.  Scooters.  Crutches.  Turn on the lights when you go into a dark area. Replace any light bulbs as soon as they burn out.  Set up your furniture so you have a clear path. Avoid moving your furniture around.  If any of your floors are uneven, fix them.  If there are any pets around you, be aware of where they are.  Review your medicines with your doctor. Some medicines can make you feel dizzy. This can increase your chance of falling. Ask your doctor what other things that you can do to help prevent falls. This information is not intended to replace advice given to you by your health care provider. Make sure you discuss any questions you have with your health care provider. Document Released: 07/27/2009 Document Revised: 03/07/2016 Document Reviewed: 11/04/2014 Elsevier Interactive Patient Education  2017 [employer].

## 2018-04-13 ENCOUNTER — Ambulatory Visit: Payer: PPO

## 2018-04-20 ENCOUNTER — Ambulatory Visit (INDEPENDENT_AMBULATORY_CARE_PROVIDER_SITE_OTHER): Payer: PPO | Admitting: Family Medicine

## 2018-04-20 ENCOUNTER — Encounter: Payer: Self-pay | Admitting: Family Medicine

## 2018-04-20 VITALS — BP 132/60 | HR 78 | Ht 61.0 in | Wt 188.0 lb

## 2018-04-20 DIAGNOSIS — Z1211 Encounter for screening for malignant neoplasm of colon: Secondary | ICD-10-CM

## 2018-04-20 DIAGNOSIS — I10 Essential (primary) hypertension: Secondary | ICD-10-CM | POA: Diagnosis not present

## 2018-04-20 DIAGNOSIS — I129 Hypertensive chronic kidney disease with stage 1 through stage 4 chronic kidney disease, or unspecified chronic kidney disease: Secondary | ICD-10-CM | POA: Diagnosis not present

## 2018-04-20 DIAGNOSIS — Z0001 Encounter for general adult medical examination with abnormal findings: Secondary | ICD-10-CM

## 2018-04-20 DIAGNOSIS — E039 Hypothyroidism, unspecified: Secondary | ICD-10-CM | POA: Diagnosis not present

## 2018-04-20 DIAGNOSIS — Z7189 Other specified counseling: Secondary | ICD-10-CM | POA: Diagnosis not present

## 2018-04-20 DIAGNOSIS — I2510 Atherosclerotic heart disease of native coronary artery without angina pectoris: Secondary | ICD-10-CM

## 2018-04-20 DIAGNOSIS — Z1231 Encounter for screening mammogram for malignant neoplasm of breast: Secondary | ICD-10-CM

## 2018-04-20 DIAGNOSIS — N2581 Secondary hyperparathyroidism of renal origin: Secondary | ICD-10-CM

## 2018-04-20 DIAGNOSIS — D696 Thrombocytopenia, unspecified: Secondary | ICD-10-CM

## 2018-04-20 DIAGNOSIS — E1122 Type 2 diabetes mellitus with diabetic chronic kidney disease: Secondary | ICD-10-CM | POA: Diagnosis not present

## 2018-04-20 DIAGNOSIS — D631 Anemia in chronic kidney disease: Secondary | ICD-10-CM | POA: Diagnosis not present

## 2018-04-20 DIAGNOSIS — N183 Chronic kidney disease, stage 3 unspecified: Secondary | ICD-10-CM

## 2018-04-20 DIAGNOSIS — E785 Hyperlipidemia, unspecified: Secondary | ICD-10-CM

## 2018-04-20 DIAGNOSIS — Z1239 Encounter for other screening for malignant neoplasm of breast: Secondary | ICD-10-CM

## 2018-04-20 LAB — URINALYSIS, ROUTINE W REFLEX MICROSCOPIC
BILIRUBIN UA: NEGATIVE
GLUCOSE, UA: NEGATIVE
KETONES UA: NEGATIVE
Leukocytes, UA: NEGATIVE
Nitrite, UA: NEGATIVE
Protein, UA: NEGATIVE
RBC, UA: NEGATIVE
Specific Gravity, UA: <1.005 — ABNORMAL LOW (ref 1.005–1.030)
UUROB: 0.2 mg/dL (ref 0.2–1.0)
pH, UA: 5 (ref 5.0–7.5)

## 2018-04-20 LAB — MICROALBUMIN, URINE WAIVED
CREATININE, URINE WAIVED: 50 mg/dL (ref 10–300)
Microalb, Ur Waived: 30 mg/L — ABNORMAL HIGH (ref 0–19)

## 2018-04-20 LAB — BAYER DCA HB A1C WAIVED: HB A1C: 7 % — AB (ref <7.0)

## 2018-04-20 MED ORDER — SITAGLIPTIN PHOSPHATE 100 MG PO TABS: 100.0000 mg | ORAL_TABLET | Freq: Every day | ORAL | 4 refills | Status: DC

## 2018-04-20 MED ORDER — LOVASTATIN 40 MG PO TABS: 40.0000 mg | ORAL_TABLET | Freq: Every day | ORAL | 4 refills | Status: DC

## 2018-04-20 MED ORDER — AMLODIPINE BESYLATE 10 MG PO TABS: 10.0000 mg | ORAL_TABLET | Freq: Every day | ORAL | 4 refills | Status: DC

## 2018-04-20 MED ORDER — GLIPIZIDE 5 MG PO TABS: 5.0000 mg | ORAL_TABLET | Freq: Two times a day (BID) | ORAL | 4 refills | Status: DC

## 2018-04-20 MED ORDER — BENAZEPRIL HCL 40 MG PO TABS: 40.0000 mg | ORAL_TABLET | Freq: Every day | ORAL | 4 refills | Status: DC

## 2018-04-20 MED ORDER — CARVEDILOL 25 MG PO TABS: 25.0000 mg | ORAL_TABLET | Freq: Two times a day (BID) | ORAL | 4 refills | Status: DC

## 2018-04-20 MED ORDER — LEVOTHYROXINE SODIUM 150 MCG PO TABS: 150.0000 ug | ORAL_TABLET | Freq: Every day | ORAL | 4 refills | Status: DC

## 2018-04-20 MED ORDER — CLONIDINE HCL 0.1 MG PO TABS: 0.1000 mg | ORAL_TABLET | Freq: Two times a day (BID) | ORAL | 4 refills | Status: DC

## 2018-04-20 NOTE — Assessment & Plan Note (Signed)
A voluntary discussion about advanced care planning including explanation and discussion of advanced directives was extentively discussed with the patient.  Explained about the healthcare proxy and living will was reviewed and packet with forms with expiration of how to fill them out was given.  Time spent: Encounter 16+ min individuals present: Patient 

## 2018-04-20 NOTE — Assessment & Plan Note (Signed)
The current medical regimen is effective;  continue present plan and medications.  

## 2018-04-20 NOTE — Progress Notes (Signed)
BP 132/60 (BP Location: Left Arm)   Pulse 78   Ht 5\' 1"  (1.549 m)   Wt 188 lb (85.3 kg)   SpO2 98%   BMI 35.52 kg/m    Subjective:    Patient ID: Julia Nielsen, female    DOB: 11-27-1940, 77 y.o.   MRN: [id number]  HPI: Julia Nielsen is a 77 y.o. female  Chief Complaint  Patient presents with  . Follow-up  . Annual Exam    Saw Tiffany   Patient all in all doing well has been able to help her daughter and this relieves great deal of stress. Blood pressure is much better Diabetes doing well with no low blood sugar spells. Other medications stable Relevant past medical, surgical, family and social history reviewed and updated as indicated. Interim medical history since our last visit reviewed. Allergies and medications reviewed and updated.  Review of Systems  Constitutional: Negative.   HENT: Negative.   Eyes: Negative.   Respiratory: Negative.   Cardiovascular: Negative.   Gastrointestinal: Negative.   Endocrine: Negative.   Genitourinary: Negative.   Musculoskeletal: Negative.   Skin: Negative.   Allergic/Immunologic: Negative.   Neurological: Negative.   Hematological: Negative.   Psychiatric/Behavioral: Negative.     Per HPI unless specifically indicated above     Objective:    BP 132/60 (BP Location: Left Arm)   Pulse 78   Ht 5\' 1"  (1.549 m)   Wt 188 lb (85.3 kg)   SpO2 98%   BMI 35.52 kg/m   Wt Readings from Last 3 Encounters:  04/20/18 188 lb (85.3 kg)  04/10/18 189 lb 8 oz (86 kg)  03/20/18 187 lb 9.6 oz (85.1 kg)    Physical Exam  Constitutional: She is oriented to person, place, and time. She appears well-developed and well-nourished.  HENT:  Head: Normocephalic and atraumatic.  Right Ear: External ear normal.  Left Ear: External ear normal.  Nose: Nose normal.  Mouth/Throat: Oropharynx is clear and moist.  Eyes: Pupils are equal, round, and reactive to light. Conjunctivae and EOM are normal.  Neck: Normal range of motion. Neck  supple. Carotid bruit is not present.  Cardiovascular: Normal rate, regular rhythm and normal heart sounds.  No murmur heard. Pulmonary/Chest: Effort normal and breath sounds normal. She exhibits no mass. Right breast exhibits no mass, no skin change and no tenderness. Left breast exhibits no mass, no skin change and no tenderness. Breasts are symmetrical.  Abdominal: Soft. Bowel sounds are normal. There is no hepatosplenomegaly.  Musculoskeletal: Normal range of motion.  Neurological: She is alert and oriented to person, place, and time.  Skin: No rash noted.  Psychiatric: She has a normal mood and affect. Her behavior is normal. Judgment and thought content normal.    Results for orders placed or performed in visit on 03/20/18  CBC  Result Value Ref Range   WBC 4.1 3.6 - 11.0 K/uL   RBC 4.28 3.80 - 5.20 MIL/uL   Hemoglobin 12.8 12.0 - 16.0 g/dL   HCT 38.1 35.0 - 47.0 %   MCV 89.0 80.0 - 100.0 fL   MCH 29.9 26.0 - 34.0 pg   MCHC 33.6 32.0 - 36.0 g/dL   RDW 14.1 11.5 - 14.5 %   Platelets 79 (L) 150 - 440 K/uL      Assessment & Plan:   Problem List Items Addressed This Visit      Cardiovascular and Mediastinum   Hypertension - Primary    The  current medical regimen is effective;  continue present plan and medications.       Relevant Orders   Bayer DCA Hb A1c Waived   CBC with Differential/Platelet   Comprehensive metabolic panel   Urinalysis, Routine w reflex microscopic   Microalbumin, Urine Waived   CAD (coronary artery disease)    The current medical regimen is effective;  continue present plan and medications.         Endocrine   Type 2 diabetes mellitus with stage 3 chronic kidney disease ([place])    The current medical regimen is effective;  continue present plan and medications.       Relevant Orders   Bayer DCA Hb A1c Waived   CBC with Differential/Platelet   Comprehensive metabolic panel   Urinalysis, Routine w reflex microscopic   Secondary  hyperparathyroidism of renal origin ([place])   Relevant Orders   TSH   Hypothyroidism    The current medical regimen is effective;  continue present plan and medications.       Relevant Orders   TSH     Genitourinary   Benign hypertensive renal disease    The current medical regimen is effective;  continue present plan and medications.       Relevant Orders   Comprehensive metabolic panel   Microalbumin, Urine Waived   Anemia of chronic renal failure   Relevant Orders   CBC with Differential/Platelet   Comprehensive metabolic panel     Other   Hyperlipidemia    The current medical regimen is effective;  continue present plan and medications.       Relevant Orders   Bayer DCA Hb A1c Waived   Comprehensive metabolic panel   Lipid panel   Urinalysis, Routine w reflex microscopic   Platelets decreased ([place])   Relevant Orders   CBC with Differential/Platelet   Advanced care planning/counseling discussion    A voluntary discussion about advanced care planning including explanation and discussion of advanced directives was extentively discussed with the patient.  Explained about the healthcare proxy and living will was reviewed and packet with forms with expiration of how to fill them out was given.  Time spent: Encounter 16+ min individuals present: Patient       Other Visit Diagnoses    Colon cancer screening       Relevant Orders   Ambulatory referral to Gastroenterology   Breast cancer screening       Relevant Orders   MM DIGITAL SCREENING BILATERAL       Follow up plan: Return in about 6 months (around 10/21/2018) for Hemoglobin A1c, BMP,  Lipids, ALT, AST.

## 2018-04-21 ENCOUNTER — Encounter: Payer: Self-pay | Admitting: Family Medicine

## 2018-04-21 LAB — CBC WITH DIFFERENTIAL/PLATELET
Basophils Absolute: 0 10*3/uL (ref 0.0–0.2)
Basos: 1 %
EOS (ABSOLUTE): 0.1 10*3/uL (ref 0.0–0.4)
EOS: 2 %
HEMOGLOBIN: 13.7 g/dL (ref 11.1–15.9)
Hematocrit: 41.6 % (ref 34.0–46.6)
IMMATURE GRANS (ABS): 0 10*3/uL (ref 0.0–0.1)
IMMATURE GRANULOCYTES: 0 %
LYMPHS ABS: 1.5 10*3/uL (ref 0.7–3.1)
Lymphs: 37 %
MCH: 29.7 pg (ref 26.6–33.0)
MCHC: 32.9 g/dL (ref 31.5–35.7)
MCV: 90 fL (ref 79–97)
Monocytes Absolute: 0.3 10*3/uL (ref 0.1–0.9)
Monocytes: 8 %
NEUTROS PCT: 52 %
Neutrophils Absolute: 2.1 10*3/uL (ref 1.4–7.0)
PLATELETS: 93 10*3/uL — AB (ref 150–450)
RBC: 4.62 x10E6/uL (ref 3.77–5.28)
RDW: 14.6 % (ref 12.3–15.4)
WBC: 4.1 10*3/uL (ref 3.4–10.8)

## 2018-04-21 LAB — COMPREHENSIVE METABOLIC PANEL
ALBUMIN: 4.2 g/dL (ref 3.5–4.8)
ALT: 25 IU/L (ref 0–32)
AST: 26 IU/L (ref 0–40)
Albumin/Globulin Ratio: 1.6 (ref 1.2–2.2)
Alkaline Phosphatase: 49 IU/L (ref 39–117)
BUN / CREAT RATIO: 17 (ref 12–28)
BUN: 22 mg/dL (ref 8–27)
Bilirubin Total: 0.5 mg/dL (ref 0.0–1.2)
CALCIUM: 9.2 mg/dL (ref 8.7–10.3)
CO2: 20 mmol/L (ref 20–29)
CREATININE: 1.29 mg/dL — AB (ref 0.57–1.00)
Chloride: 103 mmol/L (ref 96–106)
GFR, EST AFRICAN AMERICAN: 46 mL/min/{1.73_m2} — AB (ref >59)
GFR, EST NON AFRICAN AMERICAN: 39 mL/min/{1.73_m2} — AB (ref >59)
GLUCOSE: 223 mg/dL — AB (ref 65–99)
Globulin, Total: 2.7 g/dL (ref 1.5–4.5)
Potassium: 4 mmol/L (ref 3.5–5.2)
Sodium: 140 mmol/L (ref 134–144)
TOTAL PROTEIN: 6.9 g/dL (ref 6.0–8.5)

## 2018-04-21 LAB — LIPID PANEL
Chol/HDL Ratio: 2.7 ratio (ref 0.0–4.4)
Cholesterol, Total: 131 mg/dL (ref 100–199)
HDL: 48 mg/dL (ref >39)
LDL CALC: 55 mg/dL (ref 0–99)
Triglycerides: 142 mg/dL (ref 0–149)
VLDL CHOLESTEROL CAL: 28 mg/dL (ref 5–40)

## 2018-04-21 LAB — TSH: TSH: 4.12 u[IU]/mL (ref 0.450–4.500)

## 2018-04-24 ENCOUNTER — Other Ambulatory Visit: Payer: Self-pay | Admitting: Unknown Physician Specialty

## 2018-04-24 DIAGNOSIS — J309 Allergic rhinitis, unspecified: Secondary | ICD-10-CM

## 2018-05-18 DIAGNOSIS — H401132 Primary open-angle glaucoma, bilateral, moderate stage: Secondary | ICD-10-CM | POA: Diagnosis not present

## 2018-05-25 ENCOUNTER — Encounter: Payer: Self-pay | Admitting: Family Medicine

## 2018-05-25 DIAGNOSIS — H401132 Primary open-angle glaucoma, bilateral, moderate stage: Secondary | ICD-10-CM | POA: Diagnosis not present

## 2018-05-25 LAB — HM DIABETES EYE EXAM

## 2018-05-27 DIAGNOSIS — E1021 Type 1 diabetes mellitus with diabetic nephropathy: Secondary | ICD-10-CM | POA: Diagnosis not present

## 2018-05-27 DIAGNOSIS — M179 Osteoarthritis of knee, unspecified: Secondary | ICD-10-CM | POA: Insufficient documentation

## 2018-05-27 DIAGNOSIS — N183 Chronic kidney disease, stage 3 (moderate): Secondary | ICD-10-CM | POA: Diagnosis not present

## 2018-05-27 DIAGNOSIS — M1710 Unilateral primary osteoarthritis, unspecified knee: Secondary | ICD-10-CM | POA: Insufficient documentation

## 2018-05-27 DIAGNOSIS — Z86010 Personal history of colonic polyps: Secondary | ICD-10-CM | POA: Diagnosis not present

## 2018-06-10 ENCOUNTER — Other Ambulatory Visit: Payer: Self-pay | Admitting: Family Medicine

## 2018-06-10 NOTE — Telephone Encounter (Signed)
One Touch Lancets refill Last Refill: 12/18/2015; # 100; RF x 12 Last OV: 04/20/18 PCP: Dr Jeananne Rama Pharmacy: [facility] ; [place]; [place]  Did not refill since this Rx had expired; will need new MD order

## 2018-06-12 ENCOUNTER — Telehealth: Payer: Self-pay | Admitting: Family Medicine

## 2018-06-12 NOTE — Telephone Encounter (Signed)
Copied from [place] [id number]. Topic: Quick Communication - Rx Refill/Question >> Jun 12, 2018  9:21 AM Oliver Pila B wrote: Medication: ONETOUCH DELICA LANCETS 43T [place] [[id number]]   [facility] called and asked for a new Rx of the medication above to be sent w/ a ICD-10 code; contact pharmacy if needed  Agent: Please be advised that RX refills may take up to 3 business days. We ask that you follow-up with your pharmacy.

## 2018-06-16 ENCOUNTER — Other Ambulatory Visit: Payer: Self-pay | Admitting: Family Medicine

## 2018-06-16 MED ORDER — ONETOUCH DELICA LANCETS 33G MISC: 12 refills | Status: DC

## 2018-06-17 ENCOUNTER — Other Ambulatory Visit: Payer: Self-pay | Admitting: Family Medicine

## 2018-06-17 ENCOUNTER — Ambulatory Visit
Admission: RE | Admit: 2018-06-17 | Discharge: 2018-06-17 | Disposition: A | Discharge status: Home / Self Care | Payer: PPO | Source: Ambulatory Visit | Attending: Family Medicine | Admitting: Family Medicine

## 2018-06-17 DIAGNOSIS — Z1231 Encounter for screening mammogram for malignant neoplasm of breast: Secondary | ICD-10-CM | POA: Diagnosis not present

## 2018-06-17 DIAGNOSIS — Z1239 Encounter for other screening for malignant neoplasm of breast: Secondary | ICD-10-CM

## 2018-06-17 DIAGNOSIS — I10 Essential (primary) hypertension: Secondary | ICD-10-CM

## 2018-06-17 DIAGNOSIS — I2510 Atherosclerotic heart disease of native coronary artery without angina pectoris: Secondary | ICD-10-CM

## 2018-07-24 DIAGNOSIS — R600 Localized edema: Secondary | ICD-10-CM | POA: Diagnosis not present

## 2018-07-24 DIAGNOSIS — N2889 Other specified disorders of kidney and ureter: Secondary | ICD-10-CM | POA: Diagnosis not present

## 2018-07-24 DIAGNOSIS — D631 Anemia in chronic kidney disease: Secondary | ICD-10-CM | POA: Diagnosis not present

## 2018-07-24 DIAGNOSIS — R809 Proteinuria, unspecified: Secondary | ICD-10-CM | POA: Diagnosis not present

## 2018-07-29 DIAGNOSIS — I129 Hypertensive chronic kidney disease with stage 1 through stage 4 chronic kidney disease, or unspecified chronic kidney disease: Secondary | ICD-10-CM | POA: Diagnosis not present

## 2018-07-29 DIAGNOSIS — N2889 Other specified disorders of kidney and ureter: Secondary | ICD-10-CM | POA: Diagnosis not present

## 2018-07-29 DIAGNOSIS — N183 Chronic kidney disease, stage 3 (moderate): Secondary | ICD-10-CM | POA: Diagnosis not present

## 2018-07-29 DIAGNOSIS — E1122 Type 2 diabetes mellitus with diabetic chronic kidney disease: Secondary | ICD-10-CM | POA: Diagnosis not present

## 2018-07-29 DIAGNOSIS — R809 Proteinuria, unspecified: Secondary | ICD-10-CM | POA: Diagnosis not present

## 2018-08-01 ENCOUNTER — Other Ambulatory Visit: Payer: Self-pay | Admitting: Family Medicine

## 2018-08-01 DIAGNOSIS — I10 Essential (primary) hypertension: Secondary | ICD-10-CM

## 2018-08-03 DIAGNOSIS — E782 Mixed hyperlipidemia: Secondary | ICD-10-CM | POA: Diagnosis not present

## 2018-08-03 DIAGNOSIS — I10 Essential (primary) hypertension: Secondary | ICD-10-CM | POA: Diagnosis not present

## 2018-08-03 DIAGNOSIS — I2510 Atherosclerotic heart disease of native coronary artery without angina pectoris: Secondary | ICD-10-CM | POA: Diagnosis not present

## 2018-08-03 DIAGNOSIS — I340 Nonrheumatic mitral (valve) insufficiency: Secondary | ICD-10-CM | POA: Diagnosis not present

## 2018-08-03 NOTE — Telephone Encounter (Signed)
Med already at pharmacy with refills.  Requested Prescriptions  Refused Prescriptions Disp Refills  . cloNIDine (CATAPRES) 0.1 MG tablet [Pharmacy Med Name: CLONIDINE 0.1MG      TAB] 180 tablet 1    Sig: TAKE 1 TABLET BY MOUTH TWICE DAILY     Cardiovascular:  Alpha-2 Agonists Passed - 08/01/2018  9:16 AM      Passed - Last BP in normal range    BP Readings from Last 1 Encounters:  04/20/18 132/60         Passed - Last Heart Rate in normal range    Pulse Readings from Last 1 Encounters:  04/20/18 78         Passed - Valid encounter within last 6 months    Recent Outpatient Visits          3 months ago Essential hypertension   [place], Jeannette How, MD   8 months ago Essential hypertension   [place] Crissman, Jeannette How, MD   8 months ago Acute sinusitis, recurrence not specified, unspecified location   [facility] Crissman, Jeannette How, MD   11 months ago Essential hypertension   [facility] Crissman, Jeannette How, MD   1 year ago Type 2 diabetes mellitus with stage 3 chronic kidney disease, without long-term current use of insulin ([place])   [place], [place]      Future Appointments            In 2 months Crissman, Jeannette How, MD [place], [facility]   In 8 months  [employer], [place]

## 2018-08-03 NOTE — Telephone Encounter (Signed)
Routing to close encounter. 

## 2018-08-06 ENCOUNTER — Ambulatory Visit (INDEPENDENT_AMBULATORY_CARE_PROVIDER_SITE_OTHER): Payer: PPO

## 2018-08-06 DIAGNOSIS — Z23 Encounter for immunization: Secondary | ICD-10-CM | POA: Diagnosis not present

## 2018-08-06 NOTE — Patient Instructions (Signed)

## 2018-08-11 ENCOUNTER — Encounter: Payer: Self-pay | Admitting: *Deleted

## 2018-08-12 ENCOUNTER — Ambulatory Visit: Payer: PPO | Admitting: Anesthesiology

## 2018-08-12 ENCOUNTER — Other Ambulatory Visit: Payer: Self-pay

## 2018-08-12 ENCOUNTER — Ambulatory Visit
Admission: RE | Admit: 2018-08-12 | Discharge: 2018-08-12 | Disposition: A | Discharge status: Home / Self Care | Payer: PPO | Source: Ambulatory Visit | Attending: Unknown Physician Specialty | Admitting: Unknown Physician Specialty

## 2018-08-12 ENCOUNTER — Encounter
Admission: RE | Disposition: A | Discharge status: Home / Self Care | Payer: Self-pay | Source: Ambulatory Visit | Attending: Unknown Physician Specialty

## 2018-08-12 DIAGNOSIS — I6523 Occlusion and stenosis of bilateral carotid arteries: Secondary | ICD-10-CM | POA: Diagnosis not present

## 2018-08-12 DIAGNOSIS — D120 Benign neoplasm of cecum: Secondary | ICD-10-CM | POA: Diagnosis not present

## 2018-08-12 DIAGNOSIS — N183 Chronic kidney disease, stage 3 (moderate): Secondary | ICD-10-CM | POA: Insufficient documentation

## 2018-08-12 DIAGNOSIS — H409 Unspecified glaucoma: Secondary | ICD-10-CM | POA: Diagnosis not present

## 2018-08-12 DIAGNOSIS — Z96651 Presence of right artificial knee joint: Secondary | ICD-10-CM | POA: Insufficient documentation

## 2018-08-12 DIAGNOSIS — Z79899 Other long term (current) drug therapy: Secondary | ICD-10-CM | POA: Insufficient documentation

## 2018-08-12 DIAGNOSIS — Z7984 Long term (current) use of oral hypoglycemic drugs: Secondary | ICD-10-CM | POA: Diagnosis not present

## 2018-08-12 DIAGNOSIS — Z1211 Encounter for screening for malignant neoplasm of colon: Secondary | ICD-10-CM | POA: Diagnosis not present

## 2018-08-12 DIAGNOSIS — E785 Hyperlipidemia, unspecified: Secondary | ICD-10-CM | POA: Insufficient documentation

## 2018-08-12 DIAGNOSIS — Z823 Family history of stroke: Secondary | ICD-10-CM | POA: Insufficient documentation

## 2018-08-12 DIAGNOSIS — D175 Benign lipomatous neoplasm of intra-abdominal organs: Secondary | ICD-10-CM | POA: Diagnosis not present

## 2018-08-12 DIAGNOSIS — Z853 Personal history of malignant neoplasm of breast: Secondary | ICD-10-CM | POA: Diagnosis not present

## 2018-08-12 DIAGNOSIS — Z86010 Personal history of colonic polyps: Secondary | ICD-10-CM | POA: Diagnosis not present

## 2018-08-12 DIAGNOSIS — M1990 Unspecified osteoarthritis, unspecified site: Secondary | ICD-10-CM | POA: Diagnosis not present

## 2018-08-12 DIAGNOSIS — Z923 Personal history of irradiation: Secondary | ICD-10-CM | POA: Diagnosis not present

## 2018-08-12 DIAGNOSIS — I129 Hypertensive chronic kidney disease with stage 1 through stage 4 chronic kidney disease, or unspecified chronic kidney disease: Secondary | ICD-10-CM | POA: Insufficient documentation

## 2018-08-12 DIAGNOSIS — K219 Gastro-esophageal reflux disease without esophagitis: Secondary | ICD-10-CM | POA: Diagnosis not present

## 2018-08-12 DIAGNOSIS — E1122 Type 2 diabetes mellitus with diabetic chronic kidney disease: Secondary | ICD-10-CM | POA: Insufficient documentation

## 2018-08-12 DIAGNOSIS — I2510 Atherosclerotic heart disease of native coronary artery without angina pectoris: Secondary | ICD-10-CM | POA: Insufficient documentation

## 2018-08-12 DIAGNOSIS — D125 Benign neoplasm of sigmoid colon: Secondary | ICD-10-CM | POA: Diagnosis not present

## 2018-08-12 DIAGNOSIS — Z6833 Body mass index (BMI) 33.0-33.9, adult: Secondary | ICD-10-CM | POA: Insufficient documentation

## 2018-08-12 DIAGNOSIS — D126 Benign neoplasm of colon, unspecified: Secondary | ICD-10-CM | POA: Diagnosis not present

## 2018-08-12 DIAGNOSIS — M8580 Other specified disorders of bone density and structure, unspecified site: Secondary | ICD-10-CM | POA: Insufficient documentation

## 2018-08-12 DIAGNOSIS — E782 Mixed hyperlipidemia: Secondary | ICD-10-CM | POA: Diagnosis not present

## 2018-08-12 DIAGNOSIS — D631 Anemia in chronic kidney disease: Secondary | ICD-10-CM | POA: Diagnosis not present

## 2018-08-12 DIAGNOSIS — D122 Benign neoplasm of ascending colon: Secondary | ICD-10-CM | POA: Insufficient documentation

## 2018-08-12 DIAGNOSIS — Z7951 Long term (current) use of inhaled steroids: Secondary | ICD-10-CM | POA: Diagnosis not present

## 2018-08-12 DIAGNOSIS — E669 Obesity, unspecified: Secondary | ICD-10-CM | POA: Insufficient documentation

## 2018-08-12 DIAGNOSIS — Z9049 Acquired absence of other specified parts of digestive tract: Secondary | ICD-10-CM | POA: Insufficient documentation

## 2018-08-12 DIAGNOSIS — K640 First degree hemorrhoids: Secondary | ICD-10-CM | POA: Diagnosis not present

## 2018-08-12 DIAGNOSIS — E039 Hypothyroidism, unspecified: Secondary | ICD-10-CM | POA: Insufficient documentation

## 2018-08-12 DIAGNOSIS — K635 Polyp of colon: Secondary | ICD-10-CM | POA: Diagnosis not present

## 2018-08-12 HISTORY — PX: COLONOSCOPY WITH PROPOFOL: SHX5780

## 2018-08-12 HISTORY — DX: Atherosclerotic heart disease of native coronary artery without angina pectoris: I25.10

## 2018-08-12 HISTORY — DX: Rheumatic tricuspid insufficiency: I07.1

## 2018-08-12 HISTORY — DX: Localized edema: R60.0

## 2018-08-12 HISTORY — DX: Occlusion and stenosis of unspecified carotid artery: I65.29

## 2018-08-12 HISTORY — DX: Nonrheumatic mitral (valve) insufficiency: I34.0

## 2018-08-12 HISTORY — DX: Hyperlipidemia, unspecified: E78.5

## 2018-08-12 HISTORY — DX: Hypothyroidism, unspecified: E03.9

## 2018-08-12 LAB — GLUCOSE, CAPILLARY: Glucose-Capillary: 284 mg/dL — ABNORMAL HIGH (ref 70–99)

## 2018-08-12 LAB — CBC
HEMATOCRIT: 42.7 % (ref 36.0–46.0)
HEMOGLOBIN: 13.8 g/dL (ref 12.0–15.0)
MCH: 29.1 pg (ref 26.0–34.0)
MCHC: 32.3 g/dL (ref 30.0–36.0)
MCV: 89.9 fL (ref 80.0–100.0)
Platelets: 101 10*3/uL — ABNORMAL LOW (ref 150–400)
RBC: 4.75 MIL/uL (ref 3.87–5.11)
RDW: 13.7 % (ref 11.5–15.5)
WBC: 6.1 10*3/uL (ref 4.0–10.5)
nRBC: 0 % (ref 0.0–0.2)

## 2018-08-12 LAB — SURGICAL PATHOLOGY

## 2018-08-12 SURGERY — COLONOSCOPY WITH PROPOFOL
Anesthesia: General

## 2018-08-12 MED ORDER — PIPERACILLIN-TAZOBACTAM 3.375 G IVPB
INTRAVENOUS | Status: AC
  Administered 2018-08-12: 3.375 g via INTRAVENOUS
  Filled 2018-08-12: qty 50

## 2018-08-12 MED ORDER — PROPOFOL 10 MG/ML IV BOLUS
INTRAVENOUS | Status: DC | PRN
  Administered 2018-08-12: 50 mg via INTRAVENOUS
  Administered 2018-08-12 (×3): 30 mg via INTRAVENOUS

## 2018-08-12 MED ORDER — PROPOFOL 500 MG/50ML IV EMUL
INTRAVENOUS | Status: AC
  Filled 2018-08-12: qty 50

## 2018-08-12 MED ORDER — SODIUM CHLORIDE 0.9 % IV SOLN: INTRAVENOUS | Status: DC

## 2018-08-12 MED ORDER — SODIUM CHLORIDE 0.9 % IV SOLN
INTRAVENOUS | Status: DC
  Administered 2018-08-12 (×2): via INTRAVENOUS

## 2018-08-12 MED ORDER — PIPERACILLIN-TAZOBACTAM 3.375 G IVPB
3.3750 g | Freq: Once | INTRAVENOUS | Status: AC
  Administered 2018-08-12: 3.375 g via INTRAVENOUS

## 2018-08-12 MED ORDER — PROPOFOL 500 MG/50ML IV EMUL
INTRAVENOUS | Status: DC | PRN
  Administered 2018-08-12: 80 ug/kg/min via INTRAVENOUS

## 2018-08-12 NOTE — Anesthesia Post-op Follow-up Note (Signed)
Anesthesia QCDR form completed.        

## 2018-08-12 NOTE — H&P (Signed)
Primary Care Physician:  Guadalupe Maple, MD Primary Gastroenterologist:  Dr. Vira Agar  Pre-Procedure History & Physical: HPI:  Julia Nielsen is a 77 y.o. female is here for an colonoscopy.   Past Medical History:  Diagnosis Date  . Arthritis    OSTEO OF KNEE  . Breast cancer ([place]) 2000   left breast cancer, radiation  . CAD (coronary artery disease)   . Carotid artery stenosis    bilateral  . Cataract   . Chronic kidney disease    Chronic stage 3  . Coronary heart disease   . Diabetes mellitus without complication ([place])   . Edema of both legs   . GERD (gastroesophageal reflux disease)   . Glaucoma   . Hyperlipemia    mixed  . Hyperlipidemia   . Hypertension   . Hypothyroidism   . Mitral insufficiency    moderate  . Osteopenia   . Osteopenia   . Stenosis of carotid artery   . Thrombocytopenia ([place])   . Tricuspid insufficiency    moderate    Past Surgical History:  Procedure Laterality Date  . APPENDECTOMY    . BLADDER SURGERY    . BREAST EXCISIONAL BIOPSY Left 2000   breast ca  . BREAST LUMPECTOMY    . BREAST SURGERY    . CARDIAC CATHETERIZATION    . CARPAL TUNNEL RELEASE Bilateral   . CHOLECYSTECTOMY    . COLONOSCOPY    . JOINT REPLACEMENT Right 2015   knee  . parathyroid surgery    . SHOULDER SURGERY Right   . TUBAL LIGATION      Prior to Admission medications   Medication Sig Start Date End Date Taking? Authorizing Provider  amLODipine (NORVASC) 10 MG tablet Take 1 tablet (10 mg total) by mouth daily. 04/20/18  Yes Crissman, Jeannette How, MD  benazepril (LOTENSIN) 40 MG tablet Take 1 tablet (40 mg total) by mouth daily. 04/20/18  Yes Guadalupe Maple, MD  brimonidine (ALPHAGAN) 0.2 % ophthalmic solution  02/03/15  Yes [provider]  carvedilol (COREG) 25 MG tablet Take 1 tablet (25 mg total) by mouth 2 (two) times daily with a meal. 04/20/18  Yes Crissman, Jeannette How, MD  cloNIDine (CATAPRES) 0.1 MG tablet Take 1 tablet (0.1 mg total) by mouth 2 (two)  times daily. 04/20/18  Yes Crissman, Jeannette How, MD  dorzolamide-timolol (COSOPT) 22.3-6.8 MG/ML ophthalmic solution dorzolamide 22.3 mg-timolol 6.8 mg/mL eye drops   Yes [provider]  fluticasone (FLONASE) 50 MCG/ACT nasal spray USE 2 SPRAY(S) IN EACH NOSTRIL ONCE DAILY 04/24/18  Yes Crissman, Jeannette How, MD  furosemide (LASIX) 20 MG tablet Take 20 mg by mouth daily.   Yes [provider]  glipiZIDE (GLUCOTROL) 5 MG tablet Take 1 tablet (5 mg total) by mouth 2 (two) times daily before a meal. 04/20/18  Yes Crissman, Jeannette How, MD  latanoprost (XALATAN) 0.005 % ophthalmic solution  01/17/17  Yes [provider]  levothyroxine (SYNTHROID, LEVOTHROID) 150 MCG tablet Take 1 tablet (150 mcg total) by mouth daily before breakfast. 04/20/18  Yes Crissman, Jeannette How, MD  lovastatin (MEVACOR) 40 MG tablet Take 1 tablet (40 mg total) by mouth at bedtime. 04/20/18  Yes Crissman, Jeannette How, MD  timolol (TIMOPTIC) 0.5 % ophthalmic solution 1 drop 2 (two) times daily.   Yes [provider]  carvedilol (COREG) 25 MG tablet TAKE ONE-HALF TABLET BY MOUTH TWICE DAILY WITH A MEAL 06/17/18   Guadalupe Maple, MD  ondansetron [facility])  4 MG tablet Take 4 mg by mouth every 8 (eight) hours as needed for nausea or vomiting.    [provider]  ONE TOUCH ULTRA TEST test strip USE TO CHECK BLOOD SUGAR ONCE DAILY 02/25/18   Wynetta Emery, Megan P, DO  ONETOUCH DELICA LANCETS 10G MISC USE AS DIRECTED ONCE DAILY WITH TEST STRIPS E11.22 06/16/18   Guadalupe Maple, MD  sitaGLIPtin (JANUVIA) 100 MG tablet Take 1 tablet (100 mg total) by mouth daily. 04/20/18   Guadalupe Maple, MD    Allergies as of 05/29/2018  . (No Known Allergies)    Family History  Problem Relation Age of Onset  . Gallbladder disease Mother   . Aneurysm Mother   . COPD Father   . Congestive Heart Failure Father   . Emphysema Father   . Leukemia Sister   . Stroke Brother   . Dementia Brother   . Alzheimer's disease Brother   . Congestive  Heart Failure Sister   . Emphysema Sister   . Breast cancer Neg Hx     Social History   Socioeconomic History  . Marital status: Married    Spouse name: Not on file  . Number of children: Not on file  . Years of education: Not on file  . Highest education level: Not on file  Occupational History  . Not on file  Social Needs  . Financial resource strain: Not very hard  . Food insecurity:    Worry: Never true    Inability: Never true  . Transportation needs:    Medical: No    Non-medical: No  Tobacco Use  . Smoking status: Never Smoker  . Smokeless tobacco: Never Used  Substance and Sexual Activity  . Alcohol use: No  . Drug use: No  . Sexual activity: Not on file  Lifestyle  . Physical activity:    Days per week: 0 days    Minutes per session: 0 min  . Stress: Not at all  Relationships  . Social connections:    Talks on phone: More than three times a week    Gets together: Once a week    Attends religious service: Never    Active member of club or organization: No    Attends meetings of clubs or organizations: Never    Relationship status: Married  . Intimate partner violence:    Fear of current or ex partner: No    Emotionally abused: No    Physically abused: No    Forced sexual activity: No  Other Topics Concern  . Not on file  Social History Narrative  . Not on file    Review of Systems: See HPI, otherwise negative ROS  Physical Exam: BP (!) 158/57   Pulse 73   Temp 97.8 F (36.6 C) (Tympanic)   Resp 18   Ht 5\' 2"  (1.575 m)   Wt 83.9 kg   SpO2 98%   BMI 33.84 kg/m  General:   Alert,  pleasant and cooperative in NAD Head:  Normocephalic and atraumatic. Neck:  Supple; no masses or thyromegaly. Lungs:  Clear throughout to auscultation.    Heart:  Regular rate and rhythm. Abdomen:  Soft, nontender and nondistended. Normal bowel sounds, without guarding, and without rebound.   Neurologic:  Alert and  oriented x4;  grossly normal  neurologically.  Impression/Plan: Julia Nielsen is here for an colonoscopy to be performed for colonoscopy previous colon done 02/05/13  Risks, benefits, limitations, and alternatives regarding  colonoscopy  have been reviewed with the patient.  Questions have been answered.  All parties agreeable.   Gaylyn Cheers, MD  08/12/2018, 9:47 AM

## 2018-08-12 NOTE — Anesthesia Postprocedure Evaluation (Signed)
Anesthesia Post Note  Patient: Julia Nielsen  Procedure(s) Performed: COLONOSCOPY WITH PROPOFOL (N/A )  Patient location during evaluation: Endoscopy Anesthesia Type: General Level of consciousness: awake and alert Pain management: pain level controlled Vital Signs Assessment: post-procedure vital signs reviewed and stable Respiratory status: spontaneous breathing, nonlabored ventilation, respiratory function stable and patient connected to nasal cannula oxygen Cardiovascular status: blood pressure returned to baseline and stable Postop Assessment: no apparent nausea or vomiting Anesthetic complications: no     Last Vitals:  Vitals:   08/12/18 1021 08/12/18 1033  BP: 133/64   Pulse: 66   Resp: 12   Temp:  (!) 36.1 C  SpO2: 99%     Last Pain:  Vitals:   08/12/18 1028  TempSrc:   PainSc: 0-No pain                 Druscilla Petsch S

## 2018-08-12 NOTE — Anesthesia Preprocedure Evaluation (Signed)
Anesthesia Evaluation  Patient identified by MRN, date of birth, ID band Patient awake    Reviewed: Allergy & Precautions, NPO status , Patient's Chart, lab work & pertinent test results, reviewed documented beta blocker date and time   Airway Mallampati: III  TM Distance: >3 FB     Dental  (+) Chipped   Pulmonary           Cardiovascular hypertension, Pt. on medications and Pt. on home beta blockers + CAD       Neuro/Psych    GI/Hepatic GERD  ,  Endo/Other  diabetes, Type 2Hypothyroidism   Renal/GU Renal disease     Musculoskeletal  (+) Arthritis ,   Abdominal   Peds  Hematology  (+) anemia ,   Anesthesia Other Findings Obese.  Reproductive/Obstetrics                             Anesthesia Physical Anesthesia Plan  ASA: III  Anesthesia Plan: General   Post-op Pain Management:    Induction: Intravenous  PONV Risk Score and Plan:   Airway Management Planned:   Additional Equipment:   Intra-op Plan:   Post-operative Plan:   Informed Consent: I have reviewed the patients History and Physical, chart, labs and discussed the procedure including the risks, benefits and alternatives for the proposed anesthesia with the patient or authorized representative who has indicated his/her understanding and acceptance.     Plan Discussed with: CRNA  Anesthesia Plan Comments:         Anesthesia Quick Evaluation

## 2018-08-12 NOTE — Op Note (Signed)
[facility] Gastroenterology Patient Name: Julia Nielsen Procedure Date: 08/12/2018 9:50 AM MRN: [id number] Account #: [id number] Date of Birth: June 17, 1941 Admit Type: Outpatient Age: 77 Room: [facility] ENDO ROOM 2 Gender: Female Note Status: Finalized Procedure:            Colonoscopy Indications:          High risk colon cancer surveillance: Personal history                        of colonic polyps Providers:            Manya Silvas, MD Referring MD:         Guadalupe Maple, MD (Referring MD) Medicines:            Propofol per Anesthesia Complications:        No immediate complications. Procedure:            Pre-Anesthesia Assessment:                       - After reviewing the risks and benefits, the patient                        was deemed in satisfactory condition to undergo the                        procedure.                       After obtaining informed consent, the colonoscope was                        passed under direct vision. Throughout the procedure,                        the patient's blood pressure, pulse, and oxygen                        saturations were monitored continuously. The                        Colonoscope was introduced through the anus and                        advanced to the the cecum, identified by appendiceal                        orifice and ileocecal valve. The colonoscopy was                        performed without difficulty. The patient tolerated the                        procedure well. The quality of the bowel preparation                        was excellent. Findings:      A large lipoma seen in transverse colon.      A diminutive polyp was found in the sigmoid colon. The polyp was       sessile. The polyp was removed with a jumbo cold forceps. Resection and  retrieval were complete.      A small polyp was found in the cecum. The polyp was sessile. The polyp       was removed with a hot snare. Resection  and retrieval were complete.      Two sessile polyps were found in the ascending colon. The polyps were       diminutive in size. These polyps were removed with a hot snare.       Resection and retrieval were complete.      Internal hemorrhoids were found during endoscopy. The hemorrhoids were       small and Grade I (internal hemorrhoids that do not prolapse).      The exam was otherwise without abnormality. Impression:           - One diminutive polyp in the sigmoid colon, removed                        with a jumbo cold forceps. Resected and retrieved.                       - One small polyp in the cecum, removed with a hot                        snare. Resected and retrieved.                       - Two diminutive polyps in the ascending colon, removed                        with a hot snare. Resected and retrieved.                       - Internal hemorrhoids.                       - The examination was otherwise normal. Recommendation:       - Await pathology results. Manya Silvas, MD 08/12/2018 10:18:58 AM This report has been signed electronically. Number of Addenda: 0 Note Initiated On: 08/12/2018 9:50 AM Scope Withdrawal Time: 0 hours 7 minutes 44 seconds  Total Procedure Duration: 0 hours 20 minutes 28 seconds       [facility]

## 2018-08-12 NOTE — OR Nursing (Addendum)
Robin from lab here to draw CBC. Awaiting test results before starting IV and Zosyn.

## 2018-08-12 NOTE — Transfer of Care (Signed)
Immediate Anesthesia Transfer of Care Note  Patient: Julia Nielsen  Procedure(s) Performed: COLONOSCOPY WITH PROPOFOL (N/A )  Patient Location: PACU  Anesthesia Type:General  Level of Consciousness: awake  Airway & Oxygen Therapy: Patient Spontanous Breathing  Post-op Assessment: Report given to RN  Post vital signs: stable  Last Vitals:  Vitals Value Taken Time  BP 133/64 08/12/2018 10:19 AM  Temp 36.1 C 08/12/2018 10:18 AM  Pulse 80 08/12/2018 10:20 AM  Resp 12 08/12/2018 10:20 AM  SpO2 99 % 08/12/2018 10:20 AM  Vitals shown include unvalidated device data.  Last Pain:  Vitals:   08/12/18 1018  TempSrc: Tympanic  PainSc: 0-No pain         Complications: No apparent anesthesia complications

## 2018-08-12 NOTE — OR Nursing (Signed)
Reported CBC results to Dr. Vira Agar, [place] to proceed.

## 2018-08-13 ENCOUNTER — Encounter: Payer: Self-pay | Admitting: Unknown Physician Specialty

## 2018-09-14 ENCOUNTER — Other Ambulatory Visit: Payer: Self-pay | Admitting: Family Medicine

## 2018-09-14 ENCOUNTER — Telehealth: Payer: Self-pay | Admitting: Family Medicine

## 2018-09-14 DIAGNOSIS — I10 Essential (primary) hypertension: Secondary | ICD-10-CM

## 2018-09-14 DIAGNOSIS — I2510 Atherosclerotic heart disease of native coronary artery without angina pectoris: Secondary | ICD-10-CM

## 2018-09-14 NOTE — Telephone Encounter (Signed)
Copied from [place] [id number]. Topic: Quick Communication - Rx Refill/Question >> Sep 14, 2018  1:07 PM Blase Mess A wrote: Medication: carvedilol (COREG) 25 MG tablet [[id number]]   Has the patient contacted their pharmacy? Yes  (Agent: If no, request that the patient contact the pharmacy for the refill.) (Agent: If yes, when and what did the pharmacy advise?)  Preferred Pharmacy (with phone number or street name): [facility] [place] - [place] [place]) [place] [address] Phone: [phone] Fax: [phone]    Agent: Please be advised that RX refills may take up to 3 business days. We ask that you follow-up with your pharmacy.

## 2018-09-14 NOTE — Telephone Encounter (Signed)
Looking in the chart the patient was given a 42-month supply of medication with 4 refills this July.  Letter know there are refills at her pharmacy and be sure and check the chart before passing these messages on.

## 2018-09-15 NOTE — Telephone Encounter (Signed)
Requested Prescriptions  Pending Prescriptions Disp Refills  . carvedilol (COREG) 25 MG tablet [Pharmacy Med Name: CARVEDILOL 25MG      TAB] 90 tablet 0    Sig: TAKE ONE-HALF TABLET BY MOUTH TWICE DAILY WITH A MEAL     Cardiovascular:  Beta Blockers Passed - 09/14/2018  8:24 AM      Passed - Last BP in normal range    BP Readings from Last 1 Encounters:  08/12/18 133/64         Passed - Last Heart Rate in normal range    Pulse Readings from Last 1 Encounters:  08/12/18 66         Passed - Valid encounter within last 6 months    Recent Outpatient Visits          4 months ago Essential hypertension   [place] Crissman, Jeannette How, MD   9 months ago Essential hypertension   [place] Crissman, Jeannette How, MD   10 months ago Acute sinusitis, recurrence not specified, unspecified location   [facility] Crissman, Jeannette How, MD   1 year ago Essential hypertension   [facility] Crissman, Jeannette How, MD   1 year ago Type 2 diabetes mellitus with stage 3 chronic kidney disease, without long-term current use of insulin ([place])   [place], Jeannette How, MD      Future Appointments            In 1 month Crissman, Jeannette How, MD [place], [facility]   In 7 months  [employer], [place]

## 2018-09-21 ENCOUNTER — Encounter: Payer: Self-pay | Admitting: Family Medicine

## 2018-10-21 ENCOUNTER — Ambulatory Visit: Payer: PPO | Admitting: Family Medicine

## 2018-10-29 ENCOUNTER — Ambulatory Visit (INDEPENDENT_AMBULATORY_CARE_PROVIDER_SITE_OTHER): Payer: PPO | Admitting: Family Medicine

## 2018-10-29 ENCOUNTER — Encounter: Payer: Self-pay | Admitting: Family Medicine

## 2018-10-29 VITALS — BP 162/95 | HR 68 | Temp 98.4°F | Ht 62.0 in | Wt 186.5 lb

## 2018-10-29 DIAGNOSIS — E1122 Type 2 diabetes mellitus with diabetic chronic kidney disease: Secondary | ICD-10-CM | POA: Diagnosis not present

## 2018-10-29 DIAGNOSIS — I2510 Atherosclerotic heart disease of native coronary artery without angina pectoris: Secondary | ICD-10-CM | POA: Diagnosis not present

## 2018-10-29 DIAGNOSIS — N183 Chronic kidney disease, stage 3 unspecified: Secondary | ICD-10-CM

## 2018-10-29 DIAGNOSIS — E785 Hyperlipidemia, unspecified: Secondary | ICD-10-CM | POA: Diagnosis not present

## 2018-10-29 DIAGNOSIS — I10 Essential (primary) hypertension: Secondary | ICD-10-CM

## 2018-10-29 DIAGNOSIS — E039 Hypothyroidism, unspecified: Secondary | ICD-10-CM

## 2018-10-29 LAB — BAYER DCA HB A1C WAIVED: HB A1C (BAYER DCA - WAIVED): 6.8 % (ref <7.0)

## 2018-10-29 LAB — LP+ALT+AST PICCOLO, WAIVED
ALT (SGPT) Piccolo, Waived: 28 U/L (ref 10–47)
AST (SGOT) Piccolo, Waived: 31 U/L (ref 11–38)
CHOL/HDL RATIO PICCOLO,WAIVE: 3.1 mg/dL
CHOLESTEROL PICCOLO, WAIVED: 144 mg/dL (ref <200)
HDL Chol Piccolo, Waived: 46 mg/dL — ABNORMAL LOW (ref >59)
LDL CHOL CALC PICCOLO WAIVED: 69 mg/dL (ref <100)
Triglycerides Piccolo,Waived: 146 mg/dL (ref <150)
VLDL Chol Calc Piccolo,Waive: 29 mg/dL (ref <30)

## 2018-10-29 NOTE — Assessment & Plan Note (Signed)
The current medical regimen is effective;  continue present plan and medications.  

## 2018-10-29 NOTE — Progress Notes (Signed)
BP (!) 162/95 (BP Location: Left Arm, Patient Position: Sitting, Cuff Size: Normal)   Pulse 68   Temp 98.4 F (36.9 C)   Ht 5\' 2"  (1.575 m)   Wt 186 lb 8 oz (84.6 kg)   SpO2 97%   BMI 34.11 kg/m    Subjective:    Patient ID: Julia Nielsen, female    DOB: [birth year], 78 y.o.   MRN: [id number]  HPI: Julia Nielsen is a 78 y.o. female  Chief Complaint  Patient presents with  . Diabetes  . Hypertension  . Hyperlipidemia   Patient all in all doing well no complaints checks blood pressure at home and is generally in the 130s over low 80s or 70s. Takes medications faithfully and is taken today. Diabetes doing well with no low blood sugar spells. Cholesterol also doing well without problems or issues. Takes thyroid medication faithfully also.  Relevant past medical, surgical, family and social history reviewed and updated as indicated. Interim medical history since our last visit reviewed. Allergies and medications reviewed and updated.  Review of Systems  Constitutional: Negative.   Respiratory: Negative.   Cardiovascular: Negative.     Per HPI unless specifically indicated above     Objective:    BP (!) 162/95 (BP Location: Left Arm, Patient Position: Sitting, Cuff Size: Normal)   Pulse 68   Temp 98.4 F (36.9 C)   Ht 5\' 2"  (1.575 m)   Wt 186 lb 8 oz (84.6 kg)   SpO2 97%   BMI 34.11 kg/m   Wt Readings from Last 3 Encounters:  10/29/18 186 lb 8 oz (84.6 kg)  08/12/18 185 lb (83.9 kg)  04/20/18 188 lb (85.3 kg)    Physical Exam Constitutional:      Appearance: She is well-developed.  HENT:     Head: Normocephalic and atraumatic.  Eyes:     Conjunctiva/sclera: Conjunctivae normal.  Neck:     Musculoskeletal: Normal range of motion.  Cardiovascular:     Rate and Rhythm: Normal rate and regular rhythm.     Heart sounds: Normal heart sounds.  Pulmonary:     Effort: Pulmonary effort is normal.     Breath sounds: Normal breath sounds.  Musculoskeletal:  Normal range of motion.  Skin:    Findings: No erythema.  Neurological:     Mental Status: She is alert and oriented to person, place, and time.  Psychiatric:        Behavior: Behavior normal.        Thought Content: Thought content normal.        Judgment: Judgment normal.         Assessment & Plan:   Problem List Items Addressed This Visit      Cardiovascular and Mediastinum   Hypertension - Primary    The current medical regimen is effective;  continue present plan and medications.       Relevant Orders   Basic metabolic panel   CAD (coronary artery disease)    The current medical regimen is effective;  continue present plan and medications.         Endocrine   Type 2 diabetes mellitus with stage 3 chronic kidney disease ([place])    The current medical regimen is effective;  continue present plan and medications.       Relevant Orders   Bayer DCA Hb A1c Waived   Hypothyroidism    The current medical regimen is effective;  continue present plan and medications.  Other   Hyperlipidemia    The current medical regimen is effective;  continue present plan and medications.       Relevant Orders   LP+ALT+AST [place], Waived       Follow up plan: Return in about 6 months (around 04/29/2019) for Physical Exam, Hemoglobin A1c.

## 2018-10-30 LAB — BASIC METABOLIC PANEL
BUN/Creatinine Ratio: 13 (ref 12–28)
BUN: 15 mg/dL (ref 8–27)
CO2: 22 mmol/L (ref 20–29)
CREATININE: 1.14 mg/dL — AB (ref 0.57–1.00)
Calcium: 8.9 mg/dL (ref 8.7–10.3)
Chloride: 103 mmol/L (ref 96–106)
GFR calc Af Amer: 53 mL/min/{1.73_m2} — ABNORMAL LOW (ref >59)
GFR, EST NON AFRICAN AMERICAN: 46 mL/min/{1.73_m2} — AB (ref >59)
GLUCOSE: 226 mg/dL — AB (ref 65–99)
Potassium: 3.9 mmol/L (ref 3.5–5.2)
SODIUM: 141 mmol/L (ref 134–144)

## 2018-11-02 ENCOUNTER — Encounter: Payer: Self-pay | Admitting: Family Medicine

## 2018-11-23 DIAGNOSIS — H401132 Primary open-angle glaucoma, bilateral, moderate stage: Secondary | ICD-10-CM | POA: Diagnosis not present

## 2018-12-08 ENCOUNTER — Other Ambulatory Visit: Payer: Self-pay | Admitting: Family Medicine

## 2018-12-08 DIAGNOSIS — J309 Allergic rhinitis, unspecified: Secondary | ICD-10-CM

## 2018-12-08 NOTE — Telephone Encounter (Signed)
Requested Prescriptions  Pending Prescriptions Disp Refills  . fluticasone (FLONASE) 50 MCG/ACT nasal spray [Pharmacy Med Name: Fluticasone Propionate 50 MCG/ACT Nasal Suspension] 16 g 0    Sig: USE 2 SPRAYS IN EACH NOSTRIL ONCE DAILY     Ear, Nose, and Throat: Nasal Preparations - Corticosteroids Passed - 12/08/2018  8:51 AM      Passed - Valid encounter within last 12 months    Recent Outpatient Visits          1 month ago Essential hypertension   [place] Crissman, Jeannette How, MD   7 months ago Essential hypertension   [place] Crissman, Jeannette How, MD   1 year ago Essential hypertension   [facility] Crissman, Jeannette How, MD   1 year ago Acute sinusitis, recurrence not specified, unspecified location   [facility] Crissman, Jeannette How, MD   1 year ago Essential hypertension   [place], Jeannette How, MD      Future Appointments            In 4 months  [place], [place]   In 4 months Crissman, Jeannette How, MD [facility], [facility]

## 2018-12-12 ENCOUNTER — Other Ambulatory Visit: Payer: Self-pay | Admitting: Family Medicine

## 2018-12-12 DIAGNOSIS — I2510 Atherosclerotic heart disease of native coronary artery without angina pectoris: Secondary | ICD-10-CM

## 2018-12-12 DIAGNOSIS — I10 Essential (primary) hypertension: Secondary | ICD-10-CM

## 2018-12-14 NOTE — Telephone Encounter (Signed)
Requested Prescriptions  Pending Prescriptions Disp Refills  . carvedilol (COREG) 25 MG tablet [Pharmacy Med Name: Carvedilol 25 MG Oral Tablet] 90 tablet 0    Sig: TAKE 1/2 (ONE-HALF) TABLET BY MOUTH TWICE DAILY WITH A MEAL     Cardiovascular:  Beta Blockers Failed - 12/12/2018  8:14 AM      Failed - Last BP in normal range    BP Readings from Last 1 Encounters:  10/29/18 (!) 162/95         Passed - Last Heart Rate in normal range    Pulse Readings from Last 1 Encounters:  10/29/18 68         Passed - Valid encounter within last 6 months    Recent Outpatient Visits          1 month ago Essential hypertension   [place] Crissman, Jeannette How, MD   7 months ago Essential hypertension   [place] Crissman, Jeannette How, MD   1 year ago Essential hypertension   [facility] Cr[facility], Jeannette How, MD   1 year ago Acute sinusitis, recurrence not specified, unspecified location   [facility] Crissman, Jeannette How, MD   1 year ago Essential hypertension   [place], Jeannette How, MD      Future Appointments            In 4 months  [place], [place]   In 4 months Crissman, Jeannette How, MD [facility], [place]

## 2019-01-23 ENCOUNTER — Other Ambulatory Visit: Payer: Self-pay | Admitting: Family Medicine

## 2019-01-23 DIAGNOSIS — J309 Allergic rhinitis, unspecified: Secondary | ICD-10-CM

## 2019-01-27 DIAGNOSIS — N2889 Other specified disorders of kidney and ureter: Secondary | ICD-10-CM | POA: Diagnosis not present

## 2019-01-27 DIAGNOSIS — R809 Proteinuria, unspecified: Secondary | ICD-10-CM | POA: Diagnosis not present

## 2019-01-27 DIAGNOSIS — R600 Localized edema: Secondary | ICD-10-CM | POA: Diagnosis not present

## 2019-01-27 DIAGNOSIS — D631 Anemia in chronic kidney disease: Secondary | ICD-10-CM | POA: Diagnosis not present

## 2019-01-28 DIAGNOSIS — N183 Chronic kidney disease, stage 3 (moderate): Secondary | ICD-10-CM | POA: Diagnosis not present

## 2019-01-28 DIAGNOSIS — E782 Mixed hyperlipidemia: Secondary | ICD-10-CM | POA: Diagnosis not present

## 2019-01-28 DIAGNOSIS — I10 Essential (primary) hypertension: Secondary | ICD-10-CM | POA: Diagnosis not present

## 2019-01-28 DIAGNOSIS — I2510 Atherosclerotic heart disease of native coronary artery without angina pectoris: Secondary | ICD-10-CM | POA: Diagnosis not present

## 2019-03-09 ENCOUNTER — Other Ambulatory Visit: Payer: Self-pay | Admitting: Family Medicine

## 2019-03-09 DIAGNOSIS — I2510 Atherosclerotic heart disease of native coronary artery without angina pectoris: Secondary | ICD-10-CM

## 2019-03-09 DIAGNOSIS — I10 Essential (primary) hypertension: Secondary | ICD-10-CM

## 2019-03-18 DIAGNOSIS — I129 Hypertensive chronic kidney disease with stage 1 through stage 4 chronic kidney disease, or unspecified chronic kidney disease: Secondary | ICD-10-CM | POA: Diagnosis not present

## 2019-03-18 DIAGNOSIS — R809 Proteinuria, unspecified: Secondary | ICD-10-CM | POA: Diagnosis not present

## 2019-03-18 DIAGNOSIS — N183 Chronic kidney disease, stage 3 (moderate): Secondary | ICD-10-CM | POA: Diagnosis not present

## 2019-03-18 DIAGNOSIS — E1122 Type 2 diabetes mellitus with diabetic chronic kidney disease: Secondary | ICD-10-CM | POA: Diagnosis not present

## 2019-03-29 ENCOUNTER — Other Ambulatory Visit (HOSPITAL_COMMUNITY): Payer: Self-pay | Admitting: Nephrology

## 2019-03-29 ENCOUNTER — Other Ambulatory Visit: Payer: Self-pay | Admitting: Nephrology

## 2019-03-29 DIAGNOSIS — N2889 Other specified disorders of kidney and ureter: Secondary | ICD-10-CM

## 2019-04-14 ENCOUNTER — Other Ambulatory Visit: Payer: Self-pay | Admitting: Family Medicine

## 2019-04-14 DIAGNOSIS — E1122 Type 2 diabetes mellitus with diabetic chronic kidney disease: Secondary | ICD-10-CM

## 2019-04-19 ENCOUNTER — Ambulatory Visit (INDEPENDENT_AMBULATORY_CARE_PROVIDER_SITE_OTHER): Payer: PPO

## 2019-04-19 VITALS — BP 118/55 | HR 76

## 2019-04-19 DIAGNOSIS — N183 Chronic kidney disease, stage 3 (moderate): Secondary | ICD-10-CM

## 2019-04-19 DIAGNOSIS — Z0000 Encounter for general adult medical examination without abnormal findings: Secondary | ICD-10-CM | POA: Diagnosis not present

## 2019-04-19 DIAGNOSIS — E1122 Type 2 diabetes mellitus with diabetic chronic kidney disease: Secondary | ICD-10-CM | POA: Diagnosis not present

## 2019-04-19 NOTE — Patient Instructions (Signed)
Ms. Julia Nielsen , Thank you for taking time to come for your Medicare Wellness Visit. I appreciate your ongoing commitment to your health goals. Please review the following plan we discussed and let me know if I can assist you in the future.   Screening recommendations/referrals: Colonoscopy: completed 08/12/2018 Mammogram: completed 06/17/2018 Bone Density: completed 03/15/2014 Recommended yearly ophthalmology/optometry visit for glaucoma screening and checkup Recommended yearly dental visit for hygiene and checkup  Vaccinations: Influenza vaccine: up to date  Pneumococcal vaccine: up to date  Tdap vaccine: due, check with your insurance company for coverage  Shingles vaccine: shingrix eligible, declined    Advanced directives: Please bring a copy of your health care power of attorney and living will to the office at your convenience once completed  Conditions/risks identified: discussed fall preventions   Next appointment: Follow up in one year for your annual wellness exam.    Preventive Care 65 Years and Older, Female Preventive care refers to lifestyle choices and visits with your health care provider that can promote health and wellness. What does preventive care include?  A yearly physical exam. This is also called an annual well check.  Dental exams once or twice a year.  Routine eye exams. Ask your health care provider how often you should have your eyes checked.  Personal lifestyle choices, including:  Daily care of your teeth and gums.  Regular physical activity.  Eating a healthy diet.  Avoiding tobacco and drug use.  Limiting alcohol use.  Practicing safe sex.  Taking low-dose aspirin every day.  Taking vitamin and mineral supplements as recommended by your health care provider. What happens during an annual well check? The services and screenings done by your health care provider during your annual well check will depend on your age, overall health, lifestyle  risk factors, and family history of disease. Counseling  Your health care provider may ask you questions about your:  Alcohol use.  Tobacco use.  Drug use.  Emotional well-being.  Home and relationship well-being.  Sexual activity.  Eating habits.  History of falls.  Memory and ability to understand (cognition).  Work and work Statistician.  Reproductive health. Screening  You may have the following tests or measurements:  Height, weight, and BMI.  Blood pressure.  Lipid and cholesterol levels. These may be checked every 5 years, or more frequently if you are over 37 years old.  Skin check.  Lung cancer screening. You may have this screening every year starting at age 80 if you have a 30-pack-year history of smoking and currently smoke or have quit within the past 15 years.  Fecal occult blood test (FOBT) of the stool. You may have this test every year starting at age 59.  Flexible sigmoidoscopy or colonoscopy. You may have a sigmoidoscopy every 5 years or a colonoscopy every 10 years starting at age 74.  Hepatitis C blood test.  Hepatitis B blood test.  Sexually transmitted disease (STD) testing.  Diabetes screening. This is done by checking your blood sugar (glucose) after you have not eaten for a while (fasting). You may have this done every 1-3 years.  Bone density scan. This is done to screen for osteoporosis. You may have this done starting at age 23.  Mammogram. This may be done every 1-2 years. Talk to your health care provider about how often you should have regular mammograms. Talk with your health care provider about your test results, treatment options, and if necessary, the need for more tests. Vaccines  Your health care provider may recommend certain vaccines, such as:  Influenza vaccine. This is recommended every year.  Tetanus, diphtheria, and acellular pertussis (Tdap, Td) vaccine. You may need a Td booster every 10 years.  Zoster vaccine.  You may need this after age 24.  Pneumococcal 13-valent conjugate (PCV13) vaccine. One dose is recommended after age 26.  Pneumococcal polysaccharide (PPSV23) vaccine. One dose is recommended after age 69. Talk to your health care provider about which screenings and vaccines you need and how often you need them. This information is not intended to replace advice given to you by your health care provider. Make sure you discuss any questions you have with your health care provider. Document Released: 10/27/2015 Document Revised: 06/19/2016 Document Reviewed: 08/01/2015 Elsevier Interactive Patient Education  2017 [place] Prevention in the Home Falls can cause injuries. They can happen to people of all ages. There are many things you can do to make your home safe and to help prevent falls. What can I do on the outside of my home?  Regularly fix the edges of walkways and driveways and fix any cracks.  Remove anything that might make you trip as you walk through a door, such as a raised step or threshold.  Trim any bushes or trees on the path to your home.  Use bright outdoor lighting.  Clear any walking paths of anything that might make someone trip, such as rocks or tools.  Regularly check to see if handrails are loose or broken. Make sure that both sides of any steps have handrails.  Any raised decks and porches should have guardrails on the edges.  Have any leaves, snow, or ice cleared regularly.  Use sand or salt on walking paths during winter.  Clean up any spills in your garage right away. This includes oil or grease spills. What can I do in the bathroom?  Use night lights.  Install grab bars by the toilet and in the tub and shower. Do not use towel bars as grab bars.  Use non-skid mats or decals in the tub or shower.  If you need to sit down in the shower, use a plastic, non-slip stool.  Keep the floor dry. Clean up any water that spills on the floor as soon  as it happens.  Remove soap buildup in the tub or shower regularly.  Attach bath mats securely with double-sided non-slip rug tape.  Do not have throw rugs and other things on the floor that can make you trip. What can I do in the bedroom?  Use night lights.  Make sure that you have a light by your bed that is easy to reach.  Do not use any sheets or blankets that are too big for your bed. They should not hang down onto the floor.  Have a firm chair that has side arms. You can use this for support while you get dressed.  Do not have throw rugs and other things on the floor that can make you trip. What can I do in the kitchen?  Clean up any spills right away.  Avoid walking on wet floors.  Keep items that you use a lot in easy-to-reach places.  If you need to reach something above you, use a strong step stool that has a grab bar.  Keep electrical cords out of the way.  Do not use floor polish or wax that makes floors slippery. If you must use wax, use non-skid floor wax.  Do  not have throw rugs and other things on the floor that can make you trip. What can I do with my stairs?  Do not leave any items on the stairs.  Make sure that there are handrails on both sides of the stairs and use them. Fix handrails that are broken or loose. Make sure that handrails are as long as the stairways.  Check any carpeting to make sure that it is firmly attached to the stairs. Fix any carpet that is loose or worn.  Avoid having throw rugs at the top or bottom of the stairs. If you do have throw rugs, attach them to the floor with carpet tape.  Make sure that you have a light switch at the top of the stairs and the bottom of the stairs. If you do not have them, ask someone to add them for you. What else can I do to help prevent falls?  Wear shoes that:  Do not have high heels.  Have rubber bottoms.  Are comfortable and fit you well.  Are closed at the toe. Do not wear sandals.  If  you use a stepladder:  Make sure that it is fully opened. Do not climb a closed stepladder.  Make sure that both sides of the stepladder are locked into place.  Ask someone to hold it for you, if possible.  Clearly mark and make sure that you can see:  Any grab bars or handrails.  First and last steps.  Where the edge of each step is.  Use tools that help you move around (mobility aids) if they are needed. These include:  Canes.  Walkers.  Scooters.  Crutches.  Turn on the lights when you go into a dark area. Replace any light bulbs as soon as they burn out.  Set up your furniture so you have a clear path. Avoid moving your furniture around.  If any of your floors are uneven, fix them.  If there are any pets around you, be aware of where they are.  Review your medicines with your doctor. Some medicines can make you feel dizzy. This can increase your chance of falling. Ask your doctor what other things that you can do to help prevent falls. This information is not intended to replace advice given to you by your health care provider. Make sure you discuss any questions you have with your health care provider. Document Released: 07/27/2009 Document Revised: 03/07/2016 Document Reviewed: 11/04/2014 Elsevier Interactive Patient Education  2017 [employer].

## 2019-04-19 NOTE — Progress Notes (Signed)
Subjective:   LETISIA SCHWALB is a 78 y.o. female who presents for Medicare Annual (Subsequent) preventive examination.  This visit is being conducted via phone call  - after an attmept to do on video chat - due to the COVID-19 pandemic. This patient has given me verbal consent via phone to conduct this visit, patient states they are participating from their home address. Some vital signs may be absent or patient reported.   Patient identification: identified by name, DOB, and current address.    Review of Systems:   Cardiac Risk Factors include: advanced age (>48men, >61 women);diabetes mellitus;dyslipidemia;hypertension     Objective:     Vitals: BP (!) 118/55 Comment: patient reported  Pulse 76 Comment: patient reported  There is no height or weight on file to calculate BMI.  Advanced Directives 04/19/2019 08/12/2018 04/10/2018 03/21/2017 02/28/2017 02/28/2017  Does Patient Have a Medical Advance Directive? No No No No No No  Would patient like information on creating a medical advance directive? - No - Patient declined Yes (MAU/Ambulatory/Procedural Areas - Information given) Yes (MAU/Ambulatory/Procedural Areas - Information given) No - Patient declined -    Tobacco Social History   Tobacco Use  Smoking Status Never Smoker  Smokeless Tobacco Never Used     Counseling given: Not Answered   Clinical Intake:  Pre-visit preparation completed: Yes  Pain : No/denies pain     Nutritional Risks: None Diabetes: Yes CBG done?: No Did pt. bring in CBG monitor from home?: No  How often do you need to have someone help you when you read instructions, pamphlets, or other written materials from your doctor or pharmacy?: 1 - Never  Nutrition Risk Assessment:  Has the patient had any N/V/D within the last 2 months?  No  Does the patient have any non-healing wounds?  No  Has the patient had any unintentional weight loss or weight gain?  No   Diabetes:  Is the patient diabetic?   Yes  If diabetic, was a CBG obtained today?  No  Did the patient bring in their glucometer from home?  No  How often do you monitor your CBG's? Daily .   Financial Strains and Diabetes Management:  Are you having any financial strains with the device, your supplies or your medication? No .  Does the patient want to be seen by Chronic Care Management for management of their diabetes?  No  Would the patient like to be referred to a Nutritionist or for Diabetic Management?  No   Diabetic Exams:  Diabetic Eye Exam: scheduled to see Dr.Dingledin in September  Diabetic Foot Exam: to be completed at next in office visit.    Interpreter Needed?: No  Information entered by :: Annaliza Zia,LPN  Past Medical History:  Diagnosis Date  . Arthritis    OSTEO OF KNEE  . Breast cancer ([place]) 2000   left breast cancer, radiation  . CAD (coronary artery disease)   . Carotid artery stenosis    bilateral  . Cataract   . Chronic kidney disease    Chronic stage 3  . Coronary heart disease   . Diabetes mellitus without complication ([place])   . Edema of both legs   . GERD (gastroesophageal reflux disease)   . Glaucoma   . Hyperlipemia    mixed  . Hyperlipidemia   . Hypertension   . Hypothyroidism   . Mitral insufficiency    moderate  . Osteopenia   . Osteopenia   . Stenosis of  carotid artery   . Thrombocytopenia ([place])   . Tricuspid insufficiency    moderate   Past Surgical History:  Procedure Laterality Date  . APPENDECTOMY    . BLADDER SURGERY    . BREAST EXCISIONAL BIOPSY Left 2000   breast ca  . BREAST LUMPECTOMY    . BREAST SURGERY    . CARDIAC CATHETERIZATION    . CARPAL TUNNEL RELEASE Bilateral   . CHOLECYSTECTOMY    . COLONOSCOPY    . COLONOSCOPY WITH PROPOFOL N/A 08/12/2018   Procedure: COLONOSCOPY WITH PROPOFOL;  Surgeon: Manya Silvas, MD;  Location[facility];  Service: Endoscopy;  Laterality: N/A;  . JOINT REPLACEMENT Right 2015   knee  . parathyroid  surgery    . SHOULDER SURGERY Right   . TUBAL LIGATION     Family History  Problem Relation Age of Onset  . Gallbladder disease Mother   . Aneurysm Mother   . COPD Father   . Congestive Heart Failure Father   . Emphysema Father   . Leukemia Sister   . Stroke Brother   . Dementia Brother   . Alzheimer's disease Brother   . Congestive Heart Failure Sister   . Emphysema Sister   . Breast cancer Neg Hx    Social History   Socioeconomic History  . Marital status: Married    Spouse name: Not on file  . Number of children: Not on file  . Years of education: Not on file  . Highest education level: High school graduate  Occupational History  . Occupation: retired  Scientific laboratory technician  . Financial resource strain: Not very hard  . Food insecurity    Worry: Never true    Inability: Never true  . Transportation needs    Medical: No    Non-medical: No  Tobacco Use  . Smoking status: Never Smoker  . Smokeless tobacco: Never Used  Substance and Sexual Activity  . Alcohol use: No  . Drug use: No  . Sexual activity: Not on file  Lifestyle  . Physical activity    Days per week: 7 days    Minutes per session: 30 min  . Stress: Not at all  Relationships  . Social connections    Talks on phone: More than three times a week    Gets together: Once a week    Attends religious service: Never    Active member of club or organization: No    Attends meetings of clubs or organizations: Never    Relationship status: Married  Other Topics Concern  . Not on file  Social History Narrative  . Not on file    Outpatient Encounter Medications as of 04/19/2019  Medication Sig  . amLODipine (NORVASC) 10 MG tablet Take 1 tablet (10 mg total) by mouth daily.  . benazepril (LOTENSIN) 40 MG tablet Take 1 tablet (40 mg total) by mouth daily.  . brimonidine (ALPHAGAN) 0.2 % ophthalmic solution   . carvedilol (COREG) 25 MG tablet TAKE ONE-HALF TABLET BY MOUTH TWICE DAILY WITH A MEAL  . cloNIDine  (CATAPRES) 0.1 MG tablet Take 1 tablet (0.1 mg total) by mouth 2 (two) times daily.  . dorzolamide-timolol (COSOPT) 22.3-6.8 MG/ML ophthalmic solution dorzolamide 22.3 mg-timolol 6.8 mg/mL eye drops  . fluticasone (FLONASE) 50 MCG/ACT nasal spray Use 2 spray(s) in each nostril once daily  . furosemide (LASIX) 20 MG tablet Take 20 mg by mouth daily.  Marland Kitchen glipiZIDE (GLUCOTROL) 5 MG tablet Take 1 tablet (5 mg total) by  mouth 2 (two) times daily before a meal. (Patient taking differently: Take 5 mg by mouth 2 (two) times daily before a meal. 2 tablets once a day)  . latanoprost (XALATAN) 0.005 % ophthalmic solution   . levothyroxine (SYNTHROID, LEVOTHROID) 150 MCG tablet Take 1 tablet (150 mcg total) by mouth daily before breakfast.  . lovastatin (MEVACOR) 40 MG tablet Take 1 tablet (40 mg total) by mouth at bedtime.  [name] DELICA LANCETS 09W MISC USE AS DIRECTED ONCE DAILY WITH TEST STRIPS E11.22  . ONETOUCH ULTRA test strip USE TEST STRIP(S) TO CHECK GLUCOSE ONCE DAILY  . sitaGLIPtin (JANUVIA) 100 MG tablet Take 1 tablet (100 mg total) by mouth daily.  . [DISCONTINUED] timolol (TIMOPTIC) 0.5 % ophthalmic solution 1 drop 2 (two) times daily.   No facility-administered encounter medications on file as of 04/19/2019.     Activities of Daily Living In your present state of health, do you have any difficulty performing the following activities: 04/19/2019  Hearing? N  Vision? N  Difficulty concentrating or making decisions? N  Walking or climbing stairs? N  Dressing or bathing? N  Doing errands, shopping? N  Preparing Food and eating ? N  Using the Toilet? N  In the past six months, have you accidently leaked urine? N  Do you have problems with loss of bowel control? N  Managing your Medications? N  Managing your Finances? N  Housekeeping or managing your Housekeeping? N  Some recent data might be hidden    Patient Care Team: Guadalupe Maple, MD as PCP - General (Family Medicine)     Assessment:   This is a routine wellness examination for Kerri-Anne.  Exercise Activities and Dietary recommendations Current Exercise Habits: Home exercise routine, Time (Minutes): 30, Frequency (Times/Week): 7, Weekly Exercise (Minutes/Week): 210, Intensity: Mild, Exercise limited by: None identified  Goals    . DIET - INCREASE WATER INTAKE     Recommend continue drinking at least 6-8 glasses of water a day        Fall Risk: Fall Risk  04/19/2019 04/10/2018 11/10/2017 08/27/2017 04/22/2017  Falls in the past year? 0 No No No No    FALL RISK PREVENTION PERTAINING TO THE HOME:  Any stairs in or around the home? Yes  If so, are there any without handrails? no  Home free of loose throw rugs in walkways, pet beds, electrical cords, etc? Yes  Adequate lighting in your home to reduce risk of falls? Yes   ASSISTIVE DEVICES UTILIZED TO PREVENT FALLS:  Life alert? No  Use of a cane, walker or w/c? Yes  cane  Grab bars in the bathroom? Yes  Shower chair or bench in shower? No  Elevated toilet seat or a handicapped toilet? No   TIMED UP AND GO:  Unable to perform    Depression Screen PHQ 2/9 Scores 04/19/2019 04/10/2018 11/10/2017 08/27/2017  PHQ - 2 Score 0 0 0 0     Cognitive Function     6CIT Screen 04/19/2019 04/10/2018  What Year? 0 points 0 points  What month? 0 points 0 points  What time? 0 points 0 points  Count back from 20 0 points 0 points  Months in reverse 0 points 0 points  Repeat phrase 0 points 0 points  Total Score 0 0    Immunization History  Administered Date(s) Administered  . Influenza, High Dose Seasonal PF 06/18/2016, 08/27/2017, 08/06/2018  . Influenza,inj,Quad PF,6+ Mos 07/05/2015  . Pneumococcal Conjugate-13 12/08/2014  .  Pneumococcal-Unspecified 08/15/1999, 06/18/2005  . Td 01/23/2009    Qualifies for Shingles Vaccine? Yes  Zostavax completed n/a. Due for Shingrix. Education has been provided regarding the importance of this vaccine. Pt has been  advised to call insurance company to determine out of pocket expense. Advised may also receive vaccine at local pharmacy or Health Dept. Verbalized acceptance and understanding.  Tdap: Discussed need for TD/TDAP vaccine, patient verbalized understanding that this is not covered as a preventative with there insurance and to call the office if she develops any new skin injuries, ie: cuts, scrapes, bug bites, or open wounds.  Flu Vaccine: up to date   Pneumococcal Vaccine: up to date   Screening Tests Health Maintenance  Topic Date Due  . FOOT EXAM  01/14/2018  . TETANUS/TDAP  04/18/2020 (Originally 01/24/2019)  . HEMOGLOBIN A1C  04/29/2019  . INFLUENZA VACCINE  05/15/2019  . OPHTHALMOLOGY EXAM  05/26/2019  . MAMMOGRAM  06/18/2019  . COLONOSCOPY  08/12/2021  . DEXA SCAN  Completed  . PNA vac Low Risk Adult  Completed    Cancer Screenings:  Colorectal Screening: Completed 08/12/2018. Repeat every 5 years  Mammogram: Completed 06/17/2018. Repeat every year;   Bone Density: Completed 03/15/2014 .   Lung Cancer Screening: (Low Dose CT Chest recommended if Age 23-80 years, 30 pack-year currently smoking OR have quit w/in 15years.) does not qualify.    Additional Screening:  Hepatitis C Screening: does not qualify  Dental Screening: Recommended annual dental exams for proper oral hygiene   Community Resource Referral:  CRR required this visit? No       Plan:  I have personally reviewed and addressed the Medicare Annual Wellness questionnaire and have noted the following in the patient's chart:  A. Medical and social history B. Use of alcohol, tobacco or illicit drugs  C. Current medications and supplements D. Functional ability and status E.  Nutritional status F.  Physical activity G. Advance directives H. List of other physicians I.  Hospitalizations, surgeries, and ER visits in previous 12 months J.  [place] such as hearing and vision if needed, cognitive  and depression L. Referrals and appointments   In addition, I have reviewed and discussed with patient certain preventive protocols, quality metrics, and best practice recommendations. A written personalized care plan for preventive services as well as general preventive health recommendations were provided to patient.   Signed,    Bevelyn Ngo, LPN  12/20/8826 Nurse Health Advisor   Nurse Notes:  Patients insurance company requesting patient to come off Januvia due to cost. CCM referral placed to discuss diabetic medications

## 2019-05-05 ENCOUNTER — Telehealth: Payer: Self-pay | Admitting: Family Medicine

## 2019-05-05 ENCOUNTER — Other Ambulatory Visit: Payer: Self-pay

## 2019-05-05 ENCOUNTER — Encounter: Payer: Self-pay | Admitting: Family Medicine

## 2019-05-05 ENCOUNTER — Ambulatory Visit (INDEPENDENT_AMBULATORY_CARE_PROVIDER_SITE_OTHER): Payer: PPO | Admitting: Family Medicine

## 2019-05-05 ENCOUNTER — Other Ambulatory Visit: Payer: Self-pay | Admitting: Family Medicine

## 2019-05-05 DIAGNOSIS — E1122 Type 2 diabetes mellitus with diabetic chronic kidney disease: Secondary | ICD-10-CM

## 2019-05-05 DIAGNOSIS — I10 Essential (primary) hypertension: Secondary | ICD-10-CM

## 2019-05-05 DIAGNOSIS — E039 Hypothyroidism, unspecified: Secondary | ICD-10-CM | POA: Diagnosis not present

## 2019-05-05 DIAGNOSIS — I2510 Atherosclerotic heart disease of native coronary artery without angina pectoris: Secondary | ICD-10-CM | POA: Diagnosis not present

## 2019-05-05 DIAGNOSIS — E785 Hyperlipidemia, unspecified: Secondary | ICD-10-CM

## 2019-05-05 DIAGNOSIS — N183 Chronic kidney disease, stage 3 unspecified: Secondary | ICD-10-CM

## 2019-05-05 DIAGNOSIS — Z7189 Other specified counseling: Secondary | ICD-10-CM | POA: Diagnosis not present

## 2019-05-05 DIAGNOSIS — N2581 Secondary hyperparathyroidism of renal origin: Secondary | ICD-10-CM

## 2019-05-05 MED ORDER — CLONIDINE HCL 0.1 MG PO TABS: 0.1000 mg | ORAL_TABLET | Freq: Two times a day (BID) | ORAL | 4 refills | Status: DC

## 2019-05-05 MED ORDER — LEVOTHYROXINE SODIUM 150 MCG PO TABS: 150.0000 ug | ORAL_TABLET | Freq: Every day | ORAL | 4 refills | Status: DC

## 2019-05-05 MED ORDER — BENAZEPRIL HCL 40 MG PO TABS: 40.0000 mg | ORAL_TABLET | Freq: Every day | ORAL | 4 refills | Status: DC

## 2019-05-05 MED ORDER — LOVASTATIN 40 MG PO TABS: 40.0000 mg | ORAL_TABLET | Freq: Every day | ORAL | 4 refills | Status: DC

## 2019-05-05 MED ORDER — AMLODIPINE BESYLATE 10 MG PO TABS: 10.0000 mg | ORAL_TABLET | Freq: Every day | ORAL | 4 refills | Status: DC

## 2019-05-05 MED ORDER — SITAGLIPTIN PHOSPHATE 100 MG PO TABS: 100.0000 mg | ORAL_TABLET | Freq: Every day | ORAL | 4 refills | Status: DC

## 2019-05-05 MED ORDER — GLIPIZIDE 5 MG PO TABS: 5.0000 mg | ORAL_TABLET | Freq: Two times a day (BID) | ORAL | 4 refills | Status: DC

## 2019-05-05 MED ORDER — CARVEDILOL 25 MG PO TABS: 25.0000 mg | ORAL_TABLET | Freq: Two times a day (BID) | ORAL | 4 refills | Status: DC

## 2019-05-05 NOTE — Assessment & Plan Note (Signed)
The current medical regimen is effective;  continue present plan and medications.  

## 2019-05-05 NOTE — Telephone Encounter (Signed)
scheduled

## 2019-05-05 NOTE — Assessment & Plan Note (Signed)
Discussed alternative medications due to cost discussed Trulicity injectable once a week patient will investigate pricing

## 2019-05-05 NOTE — Progress Notes (Signed)
BP 135/60   Wt 182 lb (82.6 kg)   BMI 33.29 kg/m    Subjective:    Patient ID: Julia Nielsen, female    DOB: 1940/11/15, 78 y.o.   MRN: [id number]  HPI: Julia Nielsen is a 78 y.o. female  Med check  Discussed with patient medication is going into the donut hole and Januvia is very expensive.  Has recommendations from insurance for more glipizide type medications and/or Actos.  Patient already taking furosemide and is a heart patient so Actos is certainly not acceptable. Reviewed cardiology notes and discussed Trulicity which may also be very expensive. Patient's diabetes is doing well. Thyroid cholesterol no issues or problems. Blood pressure also doing well.  Relevant past medical, surgical, family and social history reviewed and updated as indicated. Interim medical history since our last visit reviewed. Allergies and medications reviewed and updated.  Review of Systems  Constitutional: Negative.   HENT: Negative.   Eyes: Negative.   Respiratory: Negative.   Cardiovascular: Negative.   Gastrointestinal: Negative.   Endocrine: Negative.   Genitourinary: Negative.   Musculoskeletal: Negative.   Skin: Negative.   Allergic/Immunologic: Negative.   Neurological: Negative.   Hematological: Negative.   Psychiatric/Behavioral: Negative.     Per HPI unless specifically indicated above     Objective:    BP 135/60   Wt 182 lb (82.6 kg)   BMI 33.29 kg/m   Wt Readings from Last 3 Encounters:  05/05/19 182 lb (82.6 kg)  10/29/18 186 lb 8 oz (84.6 kg)  08/12/18 185 lb (83.9 kg)    Physical Exam  Results for orders placed or performed in visit on 10/29/18  Bayer DCA Hb A1c Waived  Result Value Ref Range   HB A1C (BAYER DCA - WAIVED) 6.8 <7.0 %  LP+ALT+AST Piccolo, Waived  Result Value Ref Range   ALT (SGPT) Piccolo, Waived 28 10 - 47 U/L   AST (SGOT) Piccolo, Waived 31 11 - 38 U/L   Cholesterol Piccolo, Waived 144 <200 mg/dL   HDL Chol Piccolo, Waived 46 (L) >59  mg/dL   Triglycerides Piccolo,Waived 146 <150 mg/dL   Chol/HDL Ratio Piccolo,Waive 3.1 mg/dL   LDL Chol Calc Piccolo Waived 69 <100 mg/dL   VLDL Chol Calc Piccolo,Waive 29 <30 mg/dL  Basic metabolic panel  Result Value Ref Range   Glucose 226 (H) 65 - 99 mg/dL   BUN 15 8 - 27 mg/dL   Creatinine, Ser 1.14 (H) 0.57 - 1.00 mg/dL   GFR calc non Af Amer 46 (L) >59 mL/min/1.73   GFR calc Af Amer 53 (L) >59 mL/min/1.73   BUN/Creatinine Ratio 13 12 - 28   Sodium 141 134 - 144 mmol/L   Potassium 3.9 3.5 - 5.2 mmol/L   Chloride 103 96 - 106 mmol/L   CO2 22 20 - 29 mmol/L   Calcium 8.9 8.7 - 10.3 mg/dL      Assessment & Plan:   Problem List Items Addressed This Visit      Cardiovascular and Mediastinum   Hypertension    The current medical regimen is effective;  continue present plan and medications.       Relevant Medications   cloNIDine (CATAPRES) 0.1 MG tablet   amLODipine (NORVASC) 10 MG tablet   benazepril (LOTENSIN) 40 MG tablet   lovastatin (MEVACOR) 40 MG tablet   carvedilol (COREG) 25 MG tablet   CAD (coronary artery disease)    Reviewed cardiology notes and stable  Relevant Medications   cloNIDine (CATAPRES) 0.1 MG tablet   amLODipine (NORVASC) 10 MG tablet   benazepril (LOTENSIN) 40 MG tablet   lovastatin (MEVACOR) 40 MG tablet   carvedilol (COREG) 25 MG tablet     Endocrine   Type 2 diabetes mellitus with stage 3 chronic kidney disease ([place])    Discussed alternative medications due to cost discussed Trulicity injectable once a week patient will investigate pricing      Relevant Medications   sitaGLIPtin (JANUVIA) 100 MG tablet   benazepril (LOTENSIN) 40 MG tablet   lovastatin (MEVACOR) 40 MG tablet   glipiZIDE (GLUCOTROL) 5 MG tablet   Secondary hyperparathyroidism of renal origin (HCC)    Followed by nephrology and stable      Hypothyroidism   Relevant Medications   levothyroxine (SYNTHROID) 150 MCG tablet   carvedilol (COREG) 25 MG tablet      Other   Hyperlipidemia   Relevant Medications   cloNIDine (CATAPRES) 0.1 MG tablet   amLODipine (NORVASC) 10 MG tablet   benazepril (LOTENSIN) 40 MG tablet   lovastatin (MEVACOR) 40 MG tablet   carvedilol (COREG) 25 MG tablet   Advanced care planning/counseling discussion    A voluntary discussion about advanced care planning including explanation and discussion of advanced directives was extentively discussed with the patient.  Explained about the healthcare proxy and living will was reviewed and packet with forms with expiration of how to fill them out was given.  Time spent: Encounter 16+ min individuals present: Patient         Telemedicine using audio/video telecommunications for a synchronous communication visit. Today's visit due to COVID-19 isolation precautions I connected with and verified that I am speaking with the correct person using two identifiers.   I discussed the limitations, risks, security and privacy concerns of performing an evaluation and management service by telecommunication and the availability of in person appointments. I also discussed with the patient that there may be a patient responsible charge related to this service. The patient expressed understanding and agreed to proceed. [name] location is home. I am at home.   I discussed the assessment and treatment plan with the patient. The patient was provided an opportunity to ask questions and all were answered. The patient agreed with the plan and demonstrated an understanding of the instructions.   The patient was advised to call back or seek an in-person evaluation if the symptoms worsen or if the condition fails to improve as anticipated.   I provided 21+ minutes of time during this encounter. Follow up plan: Return in about 6 months (around 11/05/2019) for Hemoglobin A1c, BMP,  Lipids, ALT, AST.

## 2019-05-05 NOTE — Telephone Encounter (Signed)
Pt called in to schedule lab work, no orders in. Please advise.

## 2019-05-05 NOTE — Assessment & Plan Note (Signed)
A voluntary discussion about advanced care planning including explanation and discussion of advanced directives was extentively discussed with the patient.  Explained about the healthcare proxy and living will was reviewed and packet with forms with expiration of how to fill them out was given.  Time spent: Encounter 16+ min individuals present: Patient 

## 2019-05-05 NOTE — Telephone Encounter (Signed)
done

## 2019-05-05 NOTE — Assessment & Plan Note (Signed)
Followed by nephrology and stable 

## 2019-05-05 NOTE — Assessment & Plan Note (Signed)
Reviewed cardiology notes and stable

## 2019-05-07 ENCOUNTER — Other Ambulatory Visit: Payer: PPO

## 2019-05-07 ENCOUNTER — Other Ambulatory Visit: Payer: Self-pay

## 2019-05-07 DIAGNOSIS — I10 Essential (primary) hypertension: Secondary | ICD-10-CM

## 2019-05-07 DIAGNOSIS — E1122 Type 2 diabetes mellitus with diabetic chronic kidney disease: Secondary | ICD-10-CM | POA: Diagnosis not present

## 2019-05-07 DIAGNOSIS — E785 Hyperlipidemia, unspecified: Secondary | ICD-10-CM

## 2019-05-07 DIAGNOSIS — N183 Chronic kidney disease, stage 3 (moderate): Secondary | ICD-10-CM | POA: Diagnosis not present

## 2019-05-07 LAB — URINALYSIS, ROUTINE W REFLEX MICROSCOPIC
Bilirubin, UA: NEGATIVE
Glucose, UA: NEGATIVE
Ketones, UA: NEGATIVE
Nitrite, UA: NEGATIVE
Protein,UA: NEGATIVE
Specific Gravity, UA: 1.01 (ref 1.005–1.030)
Urobilinogen, Ur: 0.2 mg/dL (ref 0.2–1.0)
pH, UA: 5 (ref 5.0–7.5)

## 2019-05-07 LAB — MICROSCOPIC EXAMINATION: WBC, UA: >30 /hpf — AB (ref 0–5)

## 2019-05-07 LAB — BAYER DCA HB A1C WAIVED: HB A1C (BAYER DCA - WAIVED): 7.3 % — ABNORMAL HIGH (ref <7.0)

## 2019-05-08 LAB — CBC WITH DIFFERENTIAL/PLATELET
Basophils Absolute: 0 10*3/uL (ref 0.0–0.2)
Basos: 1 %
EOS (ABSOLUTE): 0.1 10*3/uL (ref 0.0–0.4)
Eos: 3 %
Hematocrit: 40.6 % (ref 34.0–46.6)
Hemoglobin: 13.2 g/dL (ref 11.1–15.9)
Immature Grans (Abs): 0 10*3/uL (ref 0.0–0.1)
Immature Granulocytes: 0 %
Lymphocytes Absolute: 1.2 10*3/uL (ref 0.7–3.1)
Lymphs: 29 %
MCH: 29.4 pg (ref 26.6–33.0)
MCHC: 32.5 g/dL (ref 31.5–35.7)
MCV: 90 fL (ref 79–97)
Monocytes Absolute: 0.4 10*3/uL (ref 0.1–0.9)
Monocytes: 10 %
Neutrophils Absolute: 2.3 10*3/uL (ref 1.4–7.0)
Neutrophils: 57 %
Platelets: 82 10*3/uL — CL (ref 150–450)
RBC: 4.49 x10E6/uL (ref 3.77–5.28)
RDW: 13.2 % (ref 11.7–15.4)
WBC: 4.1 10*3/uL (ref 3.4–10.8)

## 2019-05-08 LAB — LIPID PANEL
Chol/HDL Ratio: 3.1 ratio (ref 0.0–4.4)
Cholesterol, Total: 137 mg/dL (ref 100–199)
HDL: 44 mg/dL (ref >39)
LDL Calculated: 56 mg/dL (ref 0–99)
Triglycerides: 184 mg/dL — ABNORMAL HIGH (ref 0–149)
VLDL Cholesterol Cal: 37 mg/dL (ref 5–40)

## 2019-05-08 LAB — COMPREHENSIVE METABOLIC PANEL
ALT: 23 IU/L (ref 0–32)
AST: 25 IU/L (ref 0–40)
Albumin/Globulin Ratio: 1.8 (ref 1.2–2.2)
Albumin: 4.3 g/dL (ref 3.7–4.7)
Alkaline Phosphatase: 49 IU/L (ref 39–117)
BUN/Creatinine Ratio: 21 (ref 12–28)
BUN: 25 mg/dL (ref 8–27)
Bilirubin Total: 0.5 mg/dL (ref 0.0–1.2)
CO2: 20 mmol/L (ref 20–29)
Calcium: 9.1 mg/dL (ref 8.7–10.3)
Chloride: 106 mmol/L (ref 96–106)
Creatinine, Ser: 1.2 mg/dL — ABNORMAL HIGH (ref 0.57–1.00)
GFR calc Af Amer: 49 mL/min/{1.73_m2} — ABNORMAL LOW (ref >59)
GFR calc non Af Amer: 43 mL/min/{1.73_m2} — ABNORMAL LOW (ref >59)
Globulin, Total: 2.4 g/dL (ref 1.5–4.5)
Glucose: 211 mg/dL — ABNORMAL HIGH (ref 65–99)
Potassium: 4.2 mmol/L (ref 3.5–5.2)
Sodium: 143 mmol/L (ref 134–144)
Total Protein: 6.7 g/dL (ref 6.0–8.5)

## 2019-05-08 LAB — TSH: TSH: 4.77 u[IU]/mL — ABNORMAL HIGH (ref 0.450–4.500)

## 2019-05-12 ENCOUNTER — Other Ambulatory Visit: Payer: Self-pay | Admitting: Family Medicine

## 2019-05-12 ENCOUNTER — Ambulatory Visit: Payer: PPO | Admitting: Pharmacist

## 2019-05-12 DIAGNOSIS — I10 Essential (primary) hypertension: Secondary | ICD-10-CM

## 2019-05-12 DIAGNOSIS — E1122 Type 2 diabetes mellitus with diabetic chronic kidney disease: Secondary | ICD-10-CM

## 2019-05-12 DIAGNOSIS — I2510 Atherosclerotic heart disease of native coronary artery without angina pectoris: Secondary | ICD-10-CM

## 2019-05-12 DIAGNOSIS — N183 Chronic kidney disease, stage 3 unspecified: Secondary | ICD-10-CM

## 2019-05-12 MED ORDER — TRULICITY 0.75 MG/0.5ML ~~LOC~~ SOAJ: 0.7500 mg | SUBCUTANEOUS | 3 refills | Status: DC

## 2019-05-12 NOTE — Patient Instructions (Signed)
Visit Information  Goals Addressed            This Visit's Progress     Patient Stated   . Patient Stated (pt-stated)       Current Barriers:  . Diabetes: uncontrolled; most recent A1c 7.3%.  o Patient is in the Medicare Coverage Gap. Insurance noted that pioglitazone is cheaper, but d/t heart disease, will not recommend that. Patient contacted her insurance and they noted Trulicity is covered, but I expect her copay to be astronomical as well because she is in the Coverage Gap.  . Current antihyperglycemic regimen: Trulicity 5.72 mg (prescribed today), glipizide 10 mg QPM . Denies hypoglycemic symptoms, including dizziness, lightheadedness, shaking, sweating; notes she has been on glipizide 10 mg at night for many years now, and glipizide for at least 10 years. May not be receiving benefit from this medication anymore.  . Current meal patterns: o Breakfast: Raisin bran, grape nuts; small bowl; black coffee  o Lunch: Baked chicken; vegetables; salads; occasionally sandwiches o Supper: Same as above;  o Drinks: Does drink veggie/fruit smoothies that she puts cranberry juice in  . Cardiovascular risk reduction: o Current hypertensive regimen: carvedilol 25 mg BID, clonidine 0.1 mg BID, benazepril 40 mg QAM, amlodipine 10 mg daily, furosemide 20 mg daily o Current hyperlipidemia regimen: lovastatin 40 mg; last LDL well controlled at 56  Pharmacist Clinical Goal(s):  Marland Kitchen Over the next 60 days, patient with work with PharmD and primary care provider to address medication access  Interventions: . Comprehensive medication review performed . Discussed mechanism of action of Trulicity, and increased GI effects/impacts. Counseled on signs/symptoms of GI upset. Counseled to rotate injection sites.  . Discussed income information. Appears patient will qualify for patient assistance for Trulicity. Will prepare application and collaborate with Dr. Jeananne Rama on his signature. Patient will come by clinic  to drop off income information and sign the application.  . Denies any s/sx nocturnal hypoglycemia with evening glipizide administration. Will continue to monitor moving forward, though I suspect this medication is not effective anymore d/t length of time she has been on it.   Patient Self Care Activities:  . Patient will check blood glucose BID, document, and provide at future appointments . Patient will take medications as prescribed . Patient will contact provider with any episodes of hypoglycemia . Patient will report any questions or concerns to provider   Initial goal documentation        The patient verbalized understanding of instructions provided today and declined a print copy of patient instruction materials.   Plan: - Once all portions of application are received, will submit and pass along to [facility], CPhT for follow up.  - Will outreach patient in 3-4 weeks for continued medication management support.   [name], PharmD Clinical Pharmacist [place] [phone]

## 2019-05-12 NOTE — Chronic Care Management (AMB) (Signed)
Chronic Care Management   Note  05/12/2019 Name: Julia Nielsen MRN: [id number] DOB: August 11, 1941   Subjective:  Julia Nielsen is a 78 y.o. year old female who is a primary care patient of Crissman, Jeannette How, MD. The CCM team was consulted for assistance with chronic disease management and care coordination needs.    Received referral for medication access assistance.    Ms. Jawad was given information about Chronic Care Management services today including:  1. CCM service includes personalized support from designated clinical staff supervised by her physician, including individualized plan of care and coordination with other care providers 2. 24/7 contact phone numbers for assistance for urgent and routine care needs. 3. Service will only be billed when office clinical staff spend 20 minutes or more in a month to coordinate care. 4. Only one practitioner may furnish and bill the service in a calendar month. 5. The patient may stop CCM services at any time (effective at the end of the month) by phone call to the office staff. 6. The patient will be responsible for cost sharing (co-pay) of up to 20% of the service fee (after annual deductible is met).  Patient agreed to services and verbal consent obtained.   Review of patient status, including review of consultants reports, laboratory and other test data, was performed as part of comprehensive evaluation and provision of chronic care management services.   Objective:  Lab Results  Component Value Date   CREATININE 1.20 (H) 05/07/2019   CREATININE 1.14 (H) 10/29/2018   CREATININE 1.29 (H) 04/20/2018    Lab Results  Component Value Date   HGBA1C 7.3 (H) 05/07/2019       Component Value Date/Time   CHOL 137 05/07/2019 0939   CHOL 144 10/29/2018 0830   TRIG 184 (H) 05/07/2019 0939   TRIG 146 10/29/2018 0830   HDL 44 05/07/2019 0939   CHOLHDL 3.1 05/07/2019 0939   VLDL 29 10/29/2018 0830   LDLCALC 56 05/07/2019 0939     Clinical ASCVD: No   BP Readings from Last 3 Encounters:  05/05/19 135/60  04/19/19 (!) 118/55  10/29/18 (!) 162/95    No Known Allergies  Medications Reviewed Today    Reviewed by Guadalupe Maple, MD (Physician) on 05/05/19 at [phone]  Med List Status: <None>  Medication Order Taking? Sig Documenting Provider Last Dose Status Informant  amLODipine (NORVASC) 10 MG tablet [id number]  Take 1 tablet (10 mg total) by mouth daily. Guadalupe Maple, MD  Active   benazepril (LOTENSIN) 40 MG tablet [id number]  Take 1 tablet (40 mg total) by mouth daily. Guadalupe Maple, MD  Active   brimonidine [facility]) 0.2 % ophthalmic solution [id number] No  [provider] Taking Active            Med Note Elisabeth Pigeon Mar 27, 2015  2:59 PM) Received from: External Pharmacy  carvedilol (COREG) 25 MG tablet [id number]  Take 1 tablet (25 mg total) by mouth 2 (two) times daily with a meal. Guadalupe Maple, MD  Active   cloNIDine (CATAPRES) 0.1 MG tablet [id number]  Take 1 tablet (0.1 mg total) by mouth 2 (two) times daily. Guadalupe Maple, MD  Active   dorzolamide-timolol (COSOPT) 22.3-6.8 MG/ML ophthalmic solution [id number] No dorzolamide 22.3 mg-timolol 6.8 mg/mL eye drops [provider] Taking Active   fluticasone (FLONASE) 50 MCG/ACT nasal spray [id number] No Use 2 spray(s) in each nostril once daily Crissman, Jeannette How, MD Taking  Active   furosemide (LASIX) 20 MG tablet [id number] No Take 20 mg by mouth daily. [provider] Taking Active   glipiZIDE (GLUCOTROL) 5 MG tablet [id number]  Take 1 tablet (5 mg total) by mouth 2 (two) times daily before a meal. Crissman, Jeannette How, MD  Active   latanoprost (XALATAN) 0.005 % ophthalmic solution [id number] No  [provider] Taking Active   levothyroxine (SYNTHROID) 150 MCG tablet [id number]  Take 1 tablet (150 mcg total) by mouth daily before breakfast. Guadalupe Maple, MD  Active   lovastatin (MEVACOR) 40 MG tablet [id number]   Take 1 tablet (40 mg total) by mouth at bedtime. Guadalupe Maple, MD  Active   [facility] LANCETS 38V [place] [id number] No USE AS DIRECTED ONCE DAILY WITH TEST STRIPS E11.22 Guadalupe Maple, MD Taking Active   [facility] ULTRA test strip [id number] No USE TEST STRIP(S) TO CHECK GLUCOSE ONCE DAILY Johnson, Megan P, DO Taking Active   sitaGLIPtin (JANUVIA) 100 MG tablet [id number]  Take 1 tablet (100 mg total) by mouth daily. Guadalupe Maple, MD  Active            Assessment:   Goals Addressed            This Visit's Progress     Patient Stated   . Patient Stated (pt-stated)       Current Barriers:  . Diabetes: uncontrolled; most recent A1c 7.3%.  o Patient is in the Medicare Coverage Gap. Insurance noted that pioglitazone is cheaper, but d/t heart disease, will not recommend that. Patient contacted her insurance and they noted Trulicity is covered, but I expect her copay to be astronomical as well because she is in the Coverage Gap.  . Current antihyperglycemic regimen: Trulicity 2.33 mg (prescribed today), glipizide 10 mg QPM . Denies hypoglycemic symptoms, including dizziness, lightheadedness, shaking, sweating; notes she has been on glipizide 10 mg at night for many years now, and glipizide for at least 10 years. May not be receiving benefit from this medication anymore.  . Current meal patterns: o Breakfast: Raisin bran, grape nuts; small bowl; black coffee  o Lunch: Baked chicken; vegetables; salads; occasionally sandwiches o Supper: Same as above;  o Drinks: Does drink veggie/fruit smoothies that she puts cranberry juice in  . Cardiovascular risk reduction: o Current hypertensive regimen: carvedilol 25 mg BID, clonidine 0.1 mg BID, benazepril 40 mg QAM, amlodipine 10 mg daily, furosemide 20 mg daily o Current hyperlipidemia regimen: lovastatin 40 mg; last LDL well controlled at 56  Pharmacist Clinical Goal(s):  Marland Kitchen Over the next 60 days, patient with work with PharmD and  primary care provider to address medication access  Interventions: . Comprehensive medication review performed . Discussed mechanism of action of Trulicity, and increased GI effects/impacts. Counseled on signs/symptoms of GI upset. Counseled to rotate injection sites.  . Discussed income information. Appears patient will qualify for patient assistance for Trulicity. Will prepare application and collaborate with Dr. Jeananne Rama on his signature. Patient will come by clinic to drop off income information and sign the application.  . Denies any s/sx nocturnal hypoglycemia with evening glipizide administration. Will continue to monitor moving forward, though I suspect this medication is not effective anymore d/t length of time she has been on it.   Patient Self Care Activities:  . Patient will check blood glucose BID, document, and provide at future appointments . Patient will take medications as prescribed . Patient will contact provider with any episodes of hypoglycemia .  Patient will report any questions or concerns to provider   Initial goal documentation        Plan: - Once all portions of application are received, will submit and pass along to [facility], CPhT for follow up.  - Will outreach patient in 3-4 weeks for continued medication management support.   [name], PharmD Clinical Pharmacist [place] [phone]

## 2019-05-12 NOTE — Progress Notes (Signed)
Prescription changed to Trulicity

## 2019-05-15 ENCOUNTER — Other Ambulatory Visit: Payer: Self-pay | Admitting: Family Medicine

## 2019-05-15 DIAGNOSIS — I2510 Atherosclerotic heart disease of native coronary artery without angina pectoris: Secondary | ICD-10-CM

## 2019-05-15 DIAGNOSIS — I10 Essential (primary) hypertension: Secondary | ICD-10-CM

## 2019-05-17 ENCOUNTER — Telehealth: Payer: Self-pay | Admitting: Family Medicine

## 2019-05-17 ENCOUNTER — Other Ambulatory Visit: Payer: Self-pay | Admitting: Family Medicine

## 2019-05-17 ENCOUNTER — Ambulatory Visit: Payer: Self-pay | Admitting: Pharmacist

## 2019-05-17 DIAGNOSIS — I2510 Atherosclerotic heart disease of native coronary artery without angina pectoris: Secondary | ICD-10-CM

## 2019-05-17 DIAGNOSIS — I10 Essential (primary) hypertension: Secondary | ICD-10-CM

## 2019-05-17 NOTE — Telephone Encounter (Signed)
See CCM note. Was unable to leave message with patient.   If patient calls clinic back, can give her my direct line at [phone]

## 2019-05-17 NOTE — Chronic Care Management (AMB) (Signed)
  Chronic Care Management   Note  05/17/2019 Name: Julia Nielsen MRN: [id number] DOB: 1941-06-13  Julia Nielsen is a 78 y.o. year old female who is a primary care patient of Crissman, Jeannette How, MD. The CCM team was consulted for assistance with chronic disease management and care coordination needs.    Received message that patient had contacted clinic to speak with me. Attempted to give her a call back, but phone continued to ring and I was unable to leave a message.   Will attempt a call back later today.   [name], PharmD Clinical Pharmacist [place] [phone]

## 2019-05-17 NOTE — Chronic Care Management (AMB) (Signed)
  Chronic Care Management   Note  05/17/2019 Name: LILLYONA POLASEK MRN: [id number] DOB: [birth year]  CYENNA REBELLO is a 78 y.o. year old female who is a primary care patient of Crissman, Jeannette How, MD. The CCM team was consulted for assistance with chronic disease management and care coordination needs.    Spoke with patient. She notes that she picked up 1 month supply of Trulicity, and would like to meet for me to show her how to inject. Scheduled appointment for tomorrow face to face.   Additionally, we will apply for Trulicity patient assistance. She will bring proof of income tomorrow, along with her husband for teaching.   Follow up plan: - Will meet with patient tomorrow.   [name], PharmD Clinical Pharmacist [place] [phone]

## 2019-05-17 NOTE — Telephone Encounter (Signed)
Copied from [place] [phone]. Topic: General - Other >> May 17, 2019  9:20 AM Antonieta Iba C wrote: Reason for CRM: pt called in to speak with [name]. Called and spoke with the office, pt would like to have Catie to call her to discuss Trulicity.

## 2019-05-18 ENCOUNTER — Ambulatory Visit: Payer: PPO | Admitting: Pharmacist

## 2019-05-18 DIAGNOSIS — E1122 Type 2 diabetes mellitus with diabetic chronic kidney disease: Secondary | ICD-10-CM

## 2019-05-18 DIAGNOSIS — N183 Chronic kidney disease, stage 3 unspecified: Secondary | ICD-10-CM

## 2019-05-18 NOTE — Chronic Care Management (AMB) (Signed)
  Chronic Care Management   Follow Up Note   05/18/2019 Name: Julia Nielsen MRN: [id number] DOB: April 04, 1941  Referred by: Guadalupe Maple, MD Reason for referral : Chronic Care Management (Medication Management)   Julia Nielsen is a 78 y.o. year old female who is a primary care patient of Crissman, Jeannette How, MD. The CCM team was consulted for assistance with chronic disease management and care coordination needs.    Met with patient face to face today in office.   Review of patient status, including review of consultants reports, relevant laboratory and other test results, and collaboration with appropriate care team members and the patient's provider was performed as part of comprehensive patient evaluation and provision of chronic care management services.    Goals Addressed            This Visit's Progress     Patient Stated   . "I can't afford my medications" (pt-stated)       Current Barriers:  . Diabetes: uncontrolled; most recent A1c 7.3%. . Current antihyperglycemic regimen: Trulicity 9.94 mg (has not started), glipizide 10 mg QPM . Denies hypoglycemic symptoms, including dizziness, lightheadedness, shaking, sweating; notes she has been on glipizide 10 mg at night for many years now, and glipizide for at least 10 years. May not be receiving benefit from this medication anymore.  . Current meal patterns: o Breakfast: Raisin bran, grape nuts; small bowl; black coffee  o Lunch: Baked chicken; vegetables; salads; occasionally sandwiches o Supper: Same as above;  o Drinks: Does drink veggie/fruit smoothies that she puts cranberry juice in  . Cardiovascular risk reduction: o Current hypertensive regimen: carvedilol 25 mg BID, clonidine 0.1 mg BID, benazepril 40 mg QAM, amlodipine 10 mg daily, furosemide 20 mg daily o Current hyperlipidemia regimen: lovastatin 40 mg; last LDL well controlled at 56  Pharmacist Clinical Goal(s):  Marland Kitchen Over the next 60 days, patient with work with  PharmD and primary care provider to address medication access  Interventions: . Demonstrated injection technique to patient and her husband. Coached as the husband gave first injection in clinic. Patient and husband verbalize understanding for future injections.  . Received patient and provider signatures on [employer] application, as well as patient income information. Submitted application to [employer]; will pass along to [employer], CPhT for follow up  Patient Self Care Activities:  . Patient will check blood glucose BID, document, and provide at future appointments . Patient will take medications as prescribed . Patient will contact provider with any episodes of hypoglycemia . Patient will report any questions or concerns to provider   Please see past updates related to this goal by clicking on the "Past Updates" button in the selected goal          Plan:  - Will pass application information along to [employer], CPhT for follow up.  - Will outreach patient in 3-4 weeks for continued medication management support  [name], PharmD Clinical Pharmacist [place] [phone]

## 2019-05-18 NOTE — Patient Instructions (Signed)
Visit Information  Goals Addressed            This Visit's Progress     Patient Stated   . "I can't afford my medications" (pt-stated)       Current Barriers:  . Diabetes: uncontrolled; most recent A1c 7.3%. . Current antihyperglycemic regimen: Trulicity 3.54 mg (has not started), glipizide 10 mg QPM . Denies hypoglycemic symptoms, including dizziness, lightheadedness, shaking, sweating; notes she has been on glipizide 10 mg at night for many years now, and glipizide for at least 10 years. May not be receiving benefit from this medication anymore.  . Current meal patterns: o Breakfast: Raisin bran, grape nuts; small bowl; black coffee  o Lunch: Baked chicken; vegetables; salads; occasionally sandwiches o Supper: Same as above;  o Drinks: Does drink veggie/fruit smoothies that she puts cranberry juice in  . Cardiovascular risk reduction: o Current hypertensive regimen: carvedilol 25 mg BID, clonidine 0.1 mg BID, benazepril 40 mg QAM, amlodipine 10 mg daily, furosemide 20 mg daily o Current hyperlipidemia regimen: lovastatin 40 mg; last LDL well controlled at 56  Pharmacist Clinical Goal(s):  Marland Kitchen Over the next 60 days, patient with work with PharmD and primary care provider to address medication access  Interventions: . Demonstrated injection technique to patient and her husband. Coached as the husband gave first injection in clinic. Patient and husband verbalize understanding for future injections.  . Received patient and provider signatures on [employer] application, as well as patient income information. Submitted application to [employer]; will pass along to [employer], CPhT for follow up  Patient Self Care Activities:  . Patient will check blood glucose BID, document, and provide at future appointments . Patient will take medications as prescribed . Patient will contact provider with any episodes of hypoglycemia . Patient will report any questions or concerns to provider    Please see past updates related to this goal by clicking on the "Past Updates" button in the selected goal         The patient verbalized understanding of instructions provided today and declined a print copy of patient instruction materials.   Plan:  - Will pass application information along to [facility], CPhT for follow up.  - Will outreach patient in 3-4 weeks for continued medication management support  [name], PharmD Clinical Pharmacist [place] [phone]

## 2019-05-19 ENCOUNTER — Other Ambulatory Visit: Payer: Self-pay | Admitting: Family Medicine

## 2019-05-19 DIAGNOSIS — Z1231 Encounter for screening mammogram for malignant neoplasm of breast: Secondary | ICD-10-CM

## 2019-05-20 ENCOUNTER — Telehealth: Payer: Self-pay | Admitting: Pharmacist

## 2019-05-20 DIAGNOSIS — E1122 Type 2 diabetes mellitus with diabetic chronic kidney disease: Secondary | ICD-10-CM

## 2019-05-20 MED ORDER — ONETOUCH ULTRA VI STRP: 1.0000 | ORAL_STRIP | 11 refills | Status: DC | PRN

## 2019-05-20 NOTE — Telephone Encounter (Signed)
Patient called me, asked for refill on carvedilol, furosemide, and test strips quantity 100. She notes furosemide was originally prescribed by Dr. Juleen China, but she hasn't heard anything from his office when she called this refill request into [facility] earlier this week.  Routing to clinical team for assistance with this.

## 2019-05-20 NOTE — Telephone Encounter (Signed)
Coreg written last month for year's supply, test strips refilled. Will need to call Cardiology back about the lasix as it's not managed by this office

## 2019-05-20 NOTE — Telephone Encounter (Signed)
Routing to provider  

## 2019-05-21 ENCOUNTER — Ambulatory Visit: Payer: Self-pay | Admitting: Pharmacist

## 2019-05-21 ENCOUNTER — Other Ambulatory Visit: Payer: Self-pay | Admitting: Family Medicine

## 2019-05-21 DIAGNOSIS — I2510 Atherosclerotic heart disease of native coronary artery without angina pectoris: Secondary | ICD-10-CM

## 2019-05-21 DIAGNOSIS — I10 Essential (primary) hypertension: Secondary | ICD-10-CM

## 2019-05-21 DIAGNOSIS — E1122 Type 2 diabetes mellitus with diabetic chronic kidney disease: Secondary | ICD-10-CM

## 2019-05-21 NOTE — Telephone Encounter (Signed)
Pt.notified

## 2019-05-21 NOTE — Chronic Care Management (AMB) (Signed)
  Chronic Care Management   Note  05/21/2019 Name: JAMAIYA TUNNELL MRN: [id number] DOB: Mar 11, 1941  KARRYN KOSINSKI is a 78 y.o. year old female who is a primary care patient of Crissman, Jeannette How, MD. The CCM team was consulted for assistance with chronic disease management and care coordination needs.    Patient called me yesterday with difficulties requesting/receiving refills on a few medications. I collaborated with office clinical staff on this. She called back today because she hadn't heard anything from [place].   I contacted [facility]. They received test strips prescription, but is too soon to fill until tomorrow. Furosemide refill has been received, but too soon to fill until tomorrow. Carvedilol refill was received, but the refill request was called in with an old Rx number, so they will now fill the new prescription from 05/05/2019. I informed patient of all of this, she expressed appreciation and understanding.   [name], PharmD Clinical Pharmacist [place] [phone]

## 2019-05-28 ENCOUNTER — Telehealth: Payer: Self-pay | Admitting: Family Medicine

## 2019-05-28 DIAGNOSIS — E039 Hypothyroidism, unspecified: Secondary | ICD-10-CM

## 2019-05-28 NOTE — Telephone Encounter (Signed)
Copied from [place]. Topic: General - Other >> May 28, 2019 10:51 AM Keene Breath wrote: Reason for CRM: [facility] called to inform the doctor that they do not have the levothyroxine (SYNTHROID) 150 MCG tablet medication and would like an alternative medication.  Please advise and call back at [phone]

## 2019-05-29 MED ORDER — LEVOTHYROXINE SODIUM 150 MCG PO TABS: 150.0000 ug | ORAL_TABLET | Freq: Every day | ORAL | 4 refills | Status: DC

## 2019-05-31 ENCOUNTER — Other Ambulatory Visit: Payer: Self-pay | Admitting: Pharmacy Technician

## 2019-05-31 NOTE — Patient Outreach (Signed)
[facility]) Care Management  05/31/2019  Julia Nielsen [birth year] [id number]    Care coordination call placed to [place] and [place] in regards to patient's application for Trulicity.  Spoke to [place] at [place] who informed patient had been APPROVED 05/19/2019-10/14/2019. He informed a request was sent to the pharmacy on 05/20/2019 and that it can take 10-30 days before it is received at the patient's home.  Care coordination call placed to [place] at [facility] who informed a 90 days supply of medication was ready to be shipped to the patient. She informed that patient could call into schedule delivery.  Will outreach patient with this information.  Maciah Feeback P. Braelen Sproule, [place] Management [phone]

## 2019-05-31 NOTE — Patient Outreach (Signed)
[facility]) Care Management  05/31/2019  Julia Nielsen Mar 05, 1941 [id number]  ADDENDUM  Successful outreach call placed to patient in regards to [place] application for Trulicity.  Spoke to patient, HIPAA identifiers verified.  Informed patient she had been approved to receive her Trulicity through [employer]. Provided patient phone number to [facility] to call and set up her delivery. She informed she would do it this week. Confirmed patient had phone number if she ran into any issues.  Will followup with patient in 7-10 business days to confirm medication was received.  Cinque Begley P. Zymiere Trostle, [place] Management [phone]

## 2019-06-01 ENCOUNTER — Ambulatory Visit: Payer: Self-pay | Admitting: Pharmacist

## 2019-06-01 ENCOUNTER — Other Ambulatory Visit: Payer: Self-pay | Admitting: Pharmacy Technician

## 2019-06-01 DIAGNOSIS — E1122 Type 2 diabetes mellitus with diabetic chronic kidney disease: Secondary | ICD-10-CM

## 2019-06-01 DIAGNOSIS — N183 Chronic kidney disease, stage 3 unspecified: Secondary | ICD-10-CM

## 2019-06-01 NOTE — Chronic Care Management (AMB) (Signed)
Chronic Care Management   Follow Up Note   06/01/2019 Name: Julia Nielsen MRN: [id number] DOB: [birth year]  Referred by: Guadalupe Maple, MD Reason for referral : Chronic Care Management (Medication Management)   Julia Nielsen is a 78 y.o. year old female who is a primary care patient of Crissman, Jeannette How, MD. The CCM team was consulted for assistance with chronic disease management and care coordination needs.    Care coordination completed today.   Review of patient status, including review of consultants reports, relevant laboratory and other test results, and collaboration with appropriate care team members and the patient's provider was performed as part of comprehensive patient evaluation and provision of chronic care management services.    Outpatient Encounter Medications as of 06/01/2019  Medication Sig Note  . amLODipine (NORVASC) 10 MG tablet Take 1 tablet (10 mg total) by mouth daily.   . benazepril (LOTENSIN) 40 MG tablet Take 1 tablet (40 mg total) by mouth daily.   . brimonidine (ALPHAGAN) 0.2 % ophthalmic solution  03/27/2015: Received from: External Pharmacy  . carvedilol (COREG) 25 MG tablet Take 1 tablet (25 mg total) by mouth 2 (two) times daily with a meal.   . cloNIDine (CATAPRES) 0.1 MG tablet Take 1 tablet (0.1 mg total) by mouth 2 (two) times daily.   . dorzolamide-timolol (COSOPT) 22.3-6.8 MG/ML ophthalmic solution dorzolamide 22.3 mg-timolol 6.8 mg/mL eye drops   . Dulaglutide (TRULICITY) 6.60 YT/0.1SW SOPN Inject 0.75 mg into the skin once a week.   . fluticasone (FLONASE) 50 MCG/ACT nasal spray Use 2 spray(s) in each nostril once daily   . furosemide (LASIX) 20 MG tablet Take 20 mg by mouth daily.   Marland Kitchen glipiZIDE (GLUCOTROL) 5 MG tablet Take 1 tablet (5 mg total) by mouth 2 (two) times daily before a meal.   . glucose blood (ONETOUCH ULTRA) test strip 1 each by Other route as needed for other. Use as instructed   . latanoprost (XALATAN) 0.005 % ophthalmic  solution    . levothyroxine (SYNTHROID) 150 MCG tablet Take 1 tablet (150 mcg total) by mouth daily before breakfast.   . lovastatin (MEVACOR) 40 MG tablet Take 1 tablet (40 mg total) by mouth at bedtime.   [name] DELICA LANCETS 10X MISC USE AS DIRECTED ONCE DAILY WITH TEST STRIPS E11.22    No facility-administered encounter medications on file as of 06/01/2019.      Goals Addressed            This Visit's Progress     Patient Stated   . "I can't afford my medications" (pt-stated)       Current Barriers:  . Diabetes: uncontrolled; most recent A1c 7.3%. o Applied for patient assistance for Trulicity; patient was APPROVED.  o Though there were no issues per [name]'s phone call to the company yesterday, we had received a fax from [facility] that a prescription was needed by the pharmacy.  . Current antihyperglycemic regimen: Trulicity 3.23 mg (has not started), glipizide 10 mg QPM . Denies hypoglycemic symptoms, including dizziness, lightheadedness, shaking, sweating; notes she has been on glipizide 10 mg at night for many years now, and glipizide for at least 10 years. May not be receiving benefit from this medication anymore.  . Current meal patterns: o Breakfast: Raisin bran, grape nuts; small bowl; black coffee  o Lunch: Baked chicken; vegetables; salads; occasionally sandwiches o Supper: Same as above;  o Drinks: Does drink veggie/fruit smoothies that she puts cranberry juice in  .  Cardiovascular risk reduction: o Current hypertensive regimen: carvedilol 25 mg BID, clonidine 0.1 mg BID, benazepril 40 mg QAM, amlodipine 10 mg daily, furosemide 20 mg daily o Current hyperlipidemia regimen: lovastatin 40 mg; last LDL well controlled at 56  Pharmacist Clinical Goal(s):  Marland Kitchen Over the next 60 days, patient with work with PharmD and primary care provider to address medication access  Interventions: . To ensure there were no hold ups in the dispensing process, I contacted the  pharmacist line at [facility] and left a verbal prescription for Trulicity, as well as my call back number with any questions  Patient Self Care Activities:  . Patient will check blood glucose BID, document, and provide at future appointments . Patient will take medications as prescribed . Patient will contact provider with any episodes of hypoglycemia . Patient will report any questions or concerns to provider   Please see past updates related to this goal by clicking on the "Past Updates" button in the selected goal          Plan:  - Will f/u with patient in 2-3 weeks for continued medication management support  [name], PharmD Clinical Pharmacist [place] [phone]

## 2019-06-01 NOTE — Patient Outreach (Signed)
[facility]) Care Management  06/01/2019  Julia Nielsen [birth year] [id number]    Incoming call received from patient in regards to [place] application for Trulicity.  Spoke to patient, HIPAA identifiers verified.  Patient was calling to inform me that she was able to get in touch with [employer] and schedule her shipment. She informed it should arrive at her home on Tuesday 06/08/2019.  Will followup as previously scheduled with patient to inquire if medication was received and to inquire if she has any questions or concerns.  Warren Lindahl P. Aranza Geddes, [place] Management [phone]

## 2019-06-01 NOTE — Patient Instructions (Signed)
Visit Information  Goals Addressed            This Visit's Progress     Patient Stated   . "I can't afford my medications" (pt-stated)       Current Barriers:  . Diabetes: uncontrolled; most recent A1c 7.3%. o Applied for patient assistance for Trulicity; patient was APPROVED.  o Though there were no issues per [name]'s phone call to the company yesterday, we had received a fax from [facility] that a prescription was needed by the pharmacy.  . Current antihyperglycemic regimen: Trulicity 1.60 mg (has not started), glipizide 10 mg QPM . Denies hypoglycemic symptoms, including dizziness, lightheadedness, shaking, sweating; notes she has been on glipizide 10 mg at night for many years now, and glipizide for at least 10 years. May not be receiving benefit from this medication anymore.  . Current meal patterns: o Breakfast: Raisin bran, grape nuts; small bowl; black coffee  o Lunch: Baked chicken; vegetables; salads; occasionally sandwiches o Supper: Same as above;  o Drinks: Does drink veggie/fruit smoothies that she puts cranberry juice in  . Cardiovascular risk reduction: o Current hypertensive regimen: carvedilol 25 mg BID, clonidine 0.1 mg BID, benazepril 40 mg QAM, amlodipine 10 mg daily, furosemide 20 mg daily o Current hyperlipidemia regimen: lovastatin 40 mg; last LDL well controlled at 56  Pharmacist Clinical Goal(s):  Marland Kitchen Over the next 60 days, patient with work with PharmD and primary care provider to address medication access  Interventions: . To ensure there were no hold ups in the dispensing process, I contacted the pharmacist line at [facility] and left a verbal prescription for Trulicity, as well as my call back number with any questions  Patient Self Care Activities:  . Patient will check blood glucose BID, document, and provide at future appointments . Patient will take medications as prescribed . Patient will contact provider with any episodes of  hypoglycemia . Patient will report any questions or concerns to provider   Please see past updates related to this goal by clicking on the "Past Updates" button in the selected goal         The patient verbalized understanding of instructions provided today and declined a print copy of patient instruction materials.    Plan:  - Will f/u with patient in 2-3 weeks for continued medication management support  [name], PharmD Clinical Pharmacist [place] [phone]

## 2019-06-09 ENCOUNTER — Other Ambulatory Visit: Payer: Self-pay | Admitting: Pharmacy Technician

## 2019-06-09 ENCOUNTER — Ambulatory Visit: Payer: Self-pay | Admitting: Pharmacist

## 2019-06-09 ENCOUNTER — Other Ambulatory Visit: Payer: Self-pay | Admitting: Family Medicine

## 2019-06-09 DIAGNOSIS — E1122 Type 2 diabetes mellitus with diabetic chronic kidney disease: Secondary | ICD-10-CM

## 2019-06-09 DIAGNOSIS — R7989 Other specified abnormal findings of blood chemistry: Secondary | ICD-10-CM

## 2019-06-09 NOTE — Chronic Care Management (AMB) (Signed)
Chronic Care Management   Follow Up Note   06/09/2019 Name: Julia Nielsen MRN: [id number] DOB: 07-01-1941  Referred by: Julia Maple, MD Reason for referral : Chronic Care Management (Medication Management)   Julia Nielsen is a 78 y.o. year old female who is a primary care patient of Crissman, Jeannette How, MD. The CCM team was consulted for assistance with chronic disease management and care coordination needs.    Received message from Julia Nielsen, CPhT that patient had questions about upcoming lab work.   Review of patient status, including review of consultants reports, relevant laboratory and other test results, and collaboration with appropriate care team members and the patient's provider was performed as part of comprehensive patient evaluation and provision of chronic care management services.    SDOH (Social Determinants of Health) screening performed today: Financial Strain . See Care Plan for related entries.   Outpatient Encounter Medications as of 06/09/2019  Medication Sig Note  . amLODipine (NORVASC) 10 MG tablet Take 1 tablet (10 mg total) by mouth daily.   . benazepril (LOTENSIN) 40 MG tablet Take 1 tablet (40 mg total) by mouth daily.   . brimonidine (ALPHAGAN) 0.2 % ophthalmic solution  03/27/2015: Received from: External Pharmacy  . carvedilol (COREG) 25 MG tablet Take 1 tablet (25 mg total) by mouth 2 (two) times daily with a meal.   . cloNIDine (CATAPRES) 0.1 MG tablet Take 1 tablet (0.1 mg total) by mouth 2 (two) times daily.   . dorzolamide-timolol (COSOPT) 22.3-6.8 MG/ML ophthalmic solution dorzolamide 22.3 mg-timolol 6.8 mg/mL eye drops   . Dulaglutide (TRULICITY) A999333 0000000 SOPN Inject 0.75 mg into the skin once a week.   . fluticasone (FLONASE) 50 MCG/ACT nasal spray Use 2 spray(s) in each nostril once daily   . furosemide (LASIX) 20 MG tablet Take 20 mg by mouth daily.   Julia Nielsen glipiZIDE (GLUCOTROL) 5 MG tablet Take 1 tablet (5 mg total) by mouth 2 (two) times  daily before a meal.   . glucose blood (ONETOUCH ULTRA) test strip 1 each by Other route as needed for other. Use as instructed   . latanoprost (XALATAN) 0.005 % ophthalmic solution    . levothyroxine (SYNTHROID) 150 MCG tablet Take 1 tablet (150 mcg total) by mouth daily before breakfast.   . lovastatin (MEVACOR) 40 MG tablet Take 1 tablet (40 mg total) by mouth at bedtime.   Julia Nielsen DELICA LANCETS 99991111 MISC USE AS DIRECTED ONCE DAILY WITH TEST STRIPS E11.22    No facility-administered encounter medications on file as of 06/09/2019.      Goals Addressed            This Visit's Progress     Patient Stated   . "I can't afford my medications" (pt-stated)       Current Barriers:  . Diabetes: uncontrolled; most recent A1c 7.3%. 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 No lab work ordered.  o Patient requests an updated test strip prescription that reads to check up to twice daily.  o Denies GI upset or hypoglycemia with addition of Trulicity  . Current antihyperglycemic regimen: Trulicity A999333 mg, glipizide 10 mg QPM . Denies hypoglycemic symptoms, including dizziness, lightheadedness, shaking, sweating; notes she has been on glipizide 10 mg at night for many years now, and glipizide for at least 10 years. May not be receiving benefit from this medication anymore.  . Current blood glucose readings: nothing over 180; 130-140s in the afternoon . Cardiovascular risk reduction: o  Current hypertensive regimen: carvedilol 25 mg BID, clonidine 0.1 mg BID, benazepril 40 mg QAM, amlodipine 10 mg daily, furosemide 20 mg daily o Current hyperlipidemia regimen: lovastatin 40 mg; last LDL well controlled at 56  Pharmacist Clinical Goal(s):  Julia Nielsen Over the next 60 days, patient with work with PharmD and primary care provider to address medication access  Interventions: . Patient request Julia Nielsen or I call to review lab results, as she didn't understand the person she spoke with last time. Scheduled  telephone call on Friday.  . Encouraged patient to continue to check BG and contact provider if any hypoglycemia . Will collaborate with Julia Nielsen on lab orders and an updated glucometer prescription to reflect checking BID  Patient Self Care Activities:  . Patient will check blood glucose BID, document, and provide at future appointments . Patient will take medications as prescribed . Patient will contact provider with any episodes of hypoglycemia . Patient will report any questions or concerns to provider   Please see past updates related to this goal by clicking on the "Past Updates" button in the selected goal         Plan:  - Will outreach patient in 2 business days to review lab results  [name], PharmD Clinical Pharmacist [place] [phone]

## 2019-06-09 NOTE — Patient Outreach (Signed)
[place] [facility]) Care Management  06/09/2019  KALIYAH LUALLEN [birth year] [id number]   Successful outreach call placed to patient in regards to [place] application for Trulicity.  Spoke to patient, HIPAA identifiers verfified.  Patient informed she had received 4 boxes of Trulicity. Discussed refill procedure with patient. Patient is set up for automatic refills. Informed patient about the  importance of reaching out to the company if she has not heard from them and is down to a 2 weeks supply of medication. Patient verbalized understanding and informed she still had the company's number.  Patient informed that her blood sugars are coming down as well as her weight. Congratulated patient on her success. Patient informed that she hopes her blood work will show improvement as well and is coming to the office of Dr. Jeananne Rama tomorrow for lab work. Would prefer a call from Dr. Jeananne Rama or embedded [facility] RPh [name] to report the findings of the blood work.  Will route note to [place] concerning above information and that patient assistance has been completed. Will remove myself from care team.  Luiz Ochoa. [place] Doss, [place] Management [phone]

## 2019-06-09 NOTE — Patient Instructions (Signed)
Visit Information  Goals Addressed            This Visit's Progress     Patient Stated   . "I can't afford my medications" (pt-stated)       Current Barriers:  . Diabetes: uncontrolled; most recent A1c 7.3%. 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 No lab work ordered.  o Patient requests an updated test strip prescription that reads to check up to twice daily.  o Denies GI upset or hypoglycemia with addition of Trulicity  . Current antihyperglycemic regimen: Trulicity A999333 mg, glipizide 10 mg QPM . Denies hypoglycemic symptoms, including dizziness, lightheadedness, shaking, sweating; notes she has been on glipizide 10 mg at night for many years now, and glipizide for at least 10 years. May not be receiving benefit from this medication anymore.  . Current blood glucose readings: nothing over 180; 130-140s in the afternoon . Cardiovascular risk reduction: o Current hypertensive regimen: carvedilol 25 mg BID, clonidine 0.1 mg BID, benazepril 40 mg QAM, amlodipine 10 mg daily, furosemide 20 mg daily o Current hyperlipidemia regimen: lovastatin 40 mg; last LDL well controlled at 56  Pharmacist Clinical Goal(s):  Marland Kitchen Over the next 60 days, patient with work with PharmD and primary care provider to address medication access  Interventions: . Patient request Dr. Jeananne Rama or I call to review lab results, as she didn't understand the person she spoke with last time. Scheduled telephone call on Friday.  . Encouraged patient to continue to check BG and contact provider if any hypoglycemia . Will collaborate with Dr. Jeananne Rama on lab orders and an updated glucometer prescription to reflect checking BID  Patient Self Care Activities:  . Patient will check blood glucose BID, document, and provide at future appointments . Patient will take medications as prescribed . Patient will contact provider with any episodes of hypoglycemia . Patient will report any questions or concerns to provider   Please  see past updates related to this goal by clicking on the "Past Updates" button in the selected goal         The patient verbalized understanding of instructions provided today and declined a print copy of patient instruction materials.   Plan:  - Will outreach patient in 2 business days to review lab results  [place], PharmD Clinical Pharmacist [place] [phone]

## 2019-06-10 ENCOUNTER — Other Ambulatory Visit: Payer: Self-pay

## 2019-06-10 ENCOUNTER — Other Ambulatory Visit: Payer: PPO

## 2019-06-10 DIAGNOSIS — R7989 Other specified abnormal findings of blood chemistry: Secondary | ICD-10-CM

## 2019-06-11 ENCOUNTER — Telehealth: Payer: Self-pay

## 2019-06-11 ENCOUNTER — Ambulatory Visit (INDEPENDENT_AMBULATORY_CARE_PROVIDER_SITE_OTHER): Payer: PPO | Admitting: Pharmacist

## 2019-06-11 DIAGNOSIS — N183 Chronic kidney disease, stage 3 (moderate): Secondary | ICD-10-CM

## 2019-06-11 DIAGNOSIS — E1122 Type 2 diabetes mellitus with diabetic chronic kidney disease: Secondary | ICD-10-CM | POA: Diagnosis not present

## 2019-06-11 LAB — TSH: TSH: 3.54 u[IU]/mL (ref 0.450–4.500)

## 2019-06-11 NOTE — Chronic Care Management (AMB) (Signed)
Chronic Care Management   Follow Up Note   06/11/2019 Name: Julia Nielsen MRN: [id number] DOB: 12/14/1940  Referred by: Guadalupe Maple, MD Reason for referral : No chief complaint on file.   Julia Nielsen is a 78 y.o. year old female who is a primary care patient of Crissman, Jeannette How, MD. The CCM team was consulted for assistance with chronic disease management and care coordination needs.    Contacted patient for medication management review today.   Review of patient status, including review of consultants reports, relevant laboratory and other test results, and collaboration with appropriate care team members and the patient's provider was performed as part of comprehensive patient evaluation and provision of chronic care management services.    SDOH (Social Determinants of Health) screening performed today: Financial Strain . See Care Plan for related entries.   Outpatient Encounter Medications as of 06/11/2019  Medication Sig Note  . amLODipine (NORVASC) 10 MG tablet Take 1 tablet (10 mg total) by mouth daily.   . benazepril (LOTENSIN) 40 MG tablet Take 1 tablet (40 mg total) by mouth daily.   . brimonidine (ALPHAGAN) 0.2 % ophthalmic solution  03/27/2015: Received from: External Pharmacy  . carvedilol (COREG) 25 MG tablet Take 1 tablet (25 mg total) by mouth 2 (two) times daily with a meal.   . cloNIDine (CATAPRES) 0.1 MG tablet Take 1 tablet (0.1 mg total) by mouth 2 (two) times daily.   . dorzolamide-timolol (COSOPT) 22.3-6.8 MG/ML ophthalmic solution dorzolamide 22.3 mg-timolol 6.8 mg/mL eye drops   . Dulaglutide (TRULICITY) A999333 0000000 SOPN Inject 0.75 mg into the skin once a week.   . fluticasone (FLONASE) 50 MCG/ACT nasal spray Use 2 spray(s) in each nostril once daily   . furosemide (LASIX) 20 MG tablet Take 20 mg by mouth daily.   Marland Kitchen glipiZIDE (GLUCOTROL) 5 MG tablet Take 1 tablet (5 mg total) by mouth 2 (two) times daily before a meal.   . glucose blood (ONETOUCH  ULTRA) test strip 1 each by Other route as needed for other. Use as instructed   . latanoprost (XALATAN) 0.005 % ophthalmic solution    . levothyroxine (SYNTHROID) 150 MCG tablet Take 1 tablet (150 mcg total) by mouth daily before breakfast.   . lovastatin (MEVACOR) 40 MG tablet Take 1 tablet (40 mg total) by mouth at bedtime.   [name] DELICA LANCETS 99991111 MISC USE AS DIRECTED ONCE DAILY WITH TEST STRIPS E11.22    No facility-administered encounter medications on file as of 06/11/2019.      Goals Addressed            This Visit's Progress     Patient Stated   . "I can't afford my medications" (pt-stated)       Current Barriers:  . Diabetes: uncontrolled; most recent A1c 7.3%. o TSH lab came back WNL  . Current antihyperglycemic regimen: Trulicity A999333 mg, glipizide 10 mg QPM . Denies hypoglycemic symptoms, including dizziness, lightheadedness, shaking, sweating; . Current blood glucose readings: nothing over 180; 130-140s in the afternoon; however, requests a prescription for BID testing . Cardiovascular risk reduction: o Current hypertensive regimen: carvedilol 25 mg BID, clonidine 0.1 mg BID, benazepril 40 mg QAM, amlodipine 10 mg daily, furosemide 20 mg daily o Current hyperlipidemia regimen: lovastatin 40 mg; last LDL well controlled at 56  Pharmacist Clinical Goal(s):  Marland Kitchen Over the next 60 days, patient with work with PharmD and primary care provider to address medication access  Interventions: . Contacted  patient, reviewed that TSH was previously elevated, but now back WNL. She verbalized understanding . Will send Dr. Jeananne Rama a message asking a prescription for test strips reflecting BID testing be sent in  Patient Self Care Activities:  . Patient will check blood glucose BID, document, and provide at future appointments . Patient will take medications as prescribed . Patient will contact provider with any episodes of hypoglycemia . Patient will report any questions or  concerns to provider   Please see past updates related to this goal by clicking on the "Past Updates" button in the selected goal         Plan:  - Will outreach patient in 4-6 weeks for continued medication management support  [name], PharmD Clinical Pharmacist [place] [phone]

## 2019-06-11 NOTE — Patient Instructions (Signed)
Visit Information  Goals Addressed            This Visit's Progress     Patient Stated   . "I can't afford my medications" (pt-stated)       Current Barriers:  . Diabetes: uncontrolled; most recent A1c 7.3%. o TSH lab came back WNL  . Current antihyperglycemic regimen: Trulicity A999333 mg, glipizide 10 mg QPM . Denies hypoglycemic symptoms, including dizziness, lightheadedness, shaking, sweating; . Current blood glucose readings: nothing over 180; 130-140s in the afternoon; however, requests a prescription for BID testing . Cardiovascular risk reduction: o Current hypertensive regimen: carvedilol 25 mg BID, clonidine 0.1 mg BID, benazepril 40 mg QAM, amlodipine 10 mg daily, furosemide 20 mg daily o Current hyperlipidemia regimen: lovastatin 40 mg; last LDL well controlled at 56  Pharmacist Clinical Goal(s):  Marland Kitchen Over the next 60 days, patient with work with PharmD and primary care provider to address medication access  Interventions: . Contacted patient, reviewed that TSH was previously elevated, but now back WNL. She verbalized understanding . Will send Dr. Jeananne Rama a message asking a prescription for test strips reflecting BID testing be sent in  Patient Self Care Activities:  . Patient will check blood glucose BID, document, and provide at future appointments . Patient will take medications as prescribed . Patient will contact provider with any episodes of hypoglycemia . Patient will report any questions or concerns to provider   Please see past updates related to this goal by clicking on the "Past Updates" button in the selected goal         The patient verbalized understanding of instructions provided today and declined a print copy of patient instruction materials.   Plan:  - Will outreach patient in 4-6 weeks for continued medication management support  [name], PharmD Clinical Pharmacist [place] [phone]

## 2019-06-15 ENCOUNTER — Ambulatory Visit: Payer: Self-pay | Admitting: Pharmacist

## 2019-06-15 ENCOUNTER — Telehealth: Payer: Self-pay

## 2019-06-15 NOTE — Chronic Care Management (AMB) (Signed)
  Chronic Care Management   Note  06/15/2019 Name: [name] MRN: [id number] DOB: [birth year]  Julia Nielsen is a 78 y.o. year old female who is a primary care patient of Crissman, Jeannette How, MD. The CCM team was consulted for assistance with chronic disease management and care coordination needs.    Received message from Dr. Jeananne Rama regarding patient's request for glucometer strips to test BID- noted that patient does not need to be checking this frequently. Relayed this to patient. She verbalized understanding.   [name], PharmD Clinical Pharmacist [place] [phone]

## 2019-06-21 ENCOUNTER — Encounter: Payer: Self-pay | Admitting: Nurse Practitioner

## 2019-06-22 ENCOUNTER — Ambulatory Visit (INDEPENDENT_AMBULATORY_CARE_PROVIDER_SITE_OTHER): Payer: PPO | Admitting: Nurse Practitioner

## 2019-06-22 ENCOUNTER — Encounter: Payer: Self-pay | Admitting: Nurse Practitioner

## 2019-06-22 ENCOUNTER — Other Ambulatory Visit: Payer: Self-pay

## 2019-06-22 VITALS — BP 134/77 | HR 66 | Temp 98.2°F | Ht 60.0 in | Wt 184.0 lb

## 2019-06-22 DIAGNOSIS — E039 Hypothyroidism, unspecified: Secondary | ICD-10-CM | POA: Diagnosis not present

## 2019-06-22 DIAGNOSIS — J309 Allergic rhinitis, unspecified: Secondary | ICD-10-CM | POA: Diagnosis not present

## 2019-06-22 DIAGNOSIS — N183 Chronic kidney disease, stage 3 (moderate): Secondary | ICD-10-CM | POA: Diagnosis not present

## 2019-06-22 DIAGNOSIS — E1169 Type 2 diabetes mellitus with other specified complication: Secondary | ICD-10-CM | POA: Diagnosis not present

## 2019-06-22 DIAGNOSIS — E1122 Type 2 diabetes mellitus with diabetic chronic kidney disease: Secondary | ICD-10-CM

## 2019-06-22 DIAGNOSIS — Z0000 Encounter for general adult medical examination without abnormal findings: Secondary | ICD-10-CM

## 2019-06-22 DIAGNOSIS — I10 Essential (primary) hypertension: Secondary | ICD-10-CM | POA: Diagnosis not present

## 2019-06-22 DIAGNOSIS — E785 Hyperlipidemia, unspecified: Secondary | ICD-10-CM

## 2019-06-22 DIAGNOSIS — Z23 Encounter for immunization: Secondary | ICD-10-CM

## 2019-06-22 DIAGNOSIS — D693 Immune thrombocytopenic purpura: Secondary | ICD-10-CM | POA: Diagnosis not present

## 2019-06-22 MED ORDER — FLUTICASONE PROPIONATE 50 MCG/ACT NA SUSP: NASAL | 1 refills | Status: DC

## 2019-06-22 MED ORDER — LOVASTATIN 40 MG PO TABS: 40.0000 mg | ORAL_TABLET | Freq: Every day | ORAL | 4 refills | Status: DC

## 2019-06-22 NOTE — Assessment & Plan Note (Signed)
Seen by hematology in 2019 with recommendation to monitor CBC Q6MOS, last check July 2020.  Is to return to hematology if PLT <30.  Continue to monitor, next due January 2021.

## 2019-06-22 NOTE — Assessment & Plan Note (Signed)
Chronic, ongoing with recent July A1C 7.3%.  Continue current medication regimen and adjust as needed.  Avoid hypoglycemia with advanced age, fall prevention.  Return in 6 months to visit with PCP.

## 2019-06-22 NOTE — Progress Notes (Signed)
BP 134/77   Pulse 66   Temp 98.2 F (36.8 C) (Oral)   Ht 5' (1.524 m)   Wt 184 lb (83.5 kg)   SpO2 98%   BMI 35.94 kg/m    Subjective:    Patient ID: Julia Nielsen, female    DOB: 1941-08-14, 78 y.o.   MRN: [id number]  HPI: Julia Nielsen is a 78 y.o. female presenting on 06/22/2019 for comprehensive medical examination. Current medical complaints include:none  She currently lives with: husband Menopausal Symptoms: no   Functional Status Survey: Is the patient deaf or have difficulty hearing?: No Does the patient have difficulty seeing, even when wearing glasses/contacts?: No Does the patient have difficulty concentrating, remembering, or making decisions?: No Does the patient have difficulty walking or climbing stairs?: Yes Does the patient have difficulty dressing or bathing?: Yes Does the patient have difficulty doing errands alone such as visiting a doctor's office or shopping?: No  Depression Screen done today and results listed below:  Depression screen [facility] 2/9 06/22/2019 04/19/2019 04/10/2018 11/10/2017 08/27/2017  Decreased Interest 0 0 0 0 0  Down, Depressed, Hopeless 0 0 0 0 0  PHQ - 2 Score 0 0 0 0 0  Altered sleeping 0 - - - -  Tired, decreased energy 0 - - - -  Change in appetite 0 - - - -  Feeling bad or failure about yourself  0 - - - -  Trouble concentrating 0 - - - -  Moving slowly or fidgety/restless 0 - - - -  Suicidal thoughts 0 - - - -  PHQ-9 Score 0 - - - -  Difficult doing work/chores Not difficult at all - - - -   6CIT Screen 06/22/2019 04/19/2019 04/10/2018  What Year? 0 points 0 points 0 points  What month? 0 points 0 points 0 points  What time? 0 points 0 points 0 points  Count back from 20 0 points 0 points 0 points  Months in reverse 0 points 0 points 0 points  Repeat phrase 0 points 0 points 0 points  Total Score 0 0 0    The patient does not have a history of falls. I did not complete a risk assessment for falls. A plan of care for falls was  not documented.   Past Medical History:  Past Medical History:  Diagnosis Date  . Arthritis    OSTEO OF KNEE  . Breast cancer ([place]) 2000   left breast cancer, radiation  . CAD (coronary artery disease)   . Carotid artery stenosis    bilateral  . Cataract   . Chronic kidney disease    Chronic stage 3  . Coronary heart disease   . Diabetes mellitus without complication ([place])   . Edema of both legs   . GERD (gastroesophageal reflux disease)   . Glaucoma   . Hyperlipemia    mixed  . Hyperlipidemia   . Hypertension   . Hypothyroidism   . Mitral insufficiency    moderate  . Osteopenia   . Osteopenia   . Stenosis of carotid artery   . Thrombocytopenia ([place])   . Tricuspid insufficiency    moderate    Surgical History:  Past Surgical History:  Procedure Laterality Date  . APPENDECTOMY    . BLADDER SURGERY    . BREAST EXCISIONAL BIOPSY Left 2000   breast ca  . BREAST LUMPECTOMY    . BREAST SURGERY    . CARDIAC CATHETERIZATION    .  CARPAL TUNNEL RELEASE Bilateral   . CHOLECYSTECTOMY    . COLONOSCOPY    . COLONOSCOPY WITH PROPOFOL N/A 08/12/2018   Procedure: COLONOSCOPY WITH PROPOFOL;  Surgeon: Manya Silvas, MD;  Location: [facility] ENDOSCOPY;  Service: Endoscopy;  Laterality: N/A;  . JOINT REPLACEMENT Right 2015   knee  . parathyroid surgery    . SHOULDER SURGERY Right   . TUBAL LIGATION      Medications:  Current Outpatient Medications on File Prior to Visit  Medication Sig  . amLODipine (NORVASC) 10 MG tablet Take 1 tablet (10 mg total) by mouth daily.  . benazepril (LOTENSIN) 40 MG tablet Take 1 tablet (40 mg total) by mouth daily.  . brimonidine (ALPHAGAN) 0.2 % ophthalmic solution   . carvedilol (COREG) 25 MG tablet Take 1 tablet (25 mg total) by mouth 2 (two) times daily with a meal.  . cloNIDine (CATAPRES) 0.1 MG tablet Take 1 tablet (0.1 mg total) by mouth 2 (two) times daily.  . dorzolamide-timolol (COSOPT) 22.3-6.8 MG/ML ophthalmic solution dorzolamide  22.3 mg-timolol 6.8 mg/mL eye drops  . Dulaglutide (TRULICITY) A999333 0000000 SOPN Inject 0.75 mg into the skin once a week.  . furosemide (LASIX) 20 MG tablet Take 20 mg by mouth daily.  Marland Kitchen glipiZIDE (GLUCOTROL) 5 MG tablet Take 1 tablet (5 mg total) by mouth 2 (two) times daily before a meal.  . glucose blood (ONETOUCH ULTRA) test strip 1 each by Other route as needed for other. Use as instructed  . latanoprost (XALATAN) 0.005 % ophthalmic solution   . levothyroxine (SYNTHROID) 150 MCG tablet Take 1 tablet (150 mcg total) by mouth daily before breakfast.  . ONETOUCH DELICA LANCETS 99991111 MISC USE AS DIRECTED ONCE DAILY WITH TEST STRIPS E11.22   No current facility-administered medications on file prior to visit.     Allergies:  No Known Allergies  Social History:  Social History   Socioeconomic History  . Marital status: Married    Spouse name: Not on file  . Number of children: Not on file  . Years of education: Not on file  . Highest education level: High school graduate  Occupational History  . Occupation: retired  Scientific laboratory technician  . Financial resource strain: Not very hard  . Food insecurity    Worry: Never true    Inability: Never true  . Transportation needs    Medical: No    Non-medical: No  Tobacco Use  . Smoking status: Never Smoker  . Smokeless tobacco: Never Used  Substance and Sexual Activity  . Alcohol use: No  . Drug use: No  . Sexual activity: Not on file  Lifestyle  . Physical activity    Days per week: 7 days    Minutes per session: 30 min  . Stress: Not at all  Relationships  . Social connections    Talks on phone: More than three times a week    Gets together: Once a week    Attends religious service: Never    Active member of club or organization: No    Attends meetings of clubs or organizations: Never    Relationship status: Married  . Intimate partner violence    Fear of current or ex partner: No    Emotionally abused: No    Physically abused:  No    Forced sexual activity: No  Other Topics Concern  . Not on file  Social History Narrative  . Not on file   Social History   Tobacco Use  Smoking Status Never Smoker  Smokeless Tobacco Never Used   Social History   Substance and Sexual Activity  Alcohol Use No    Family History:  Family History  Problem Relation Age of Onset  . Gallbladder disease Mother   . Aneurysm Mother   . COPD Father   . Congestive Heart Failure Father   . Emphysema Father   . Leukemia Sister   . Stroke Brother   . Dementia Brother   . Alzheimer's disease Brother   . Congestive Heart Failure Sister   . Emphysema Sister   . Breast cancer Neg Hx     Past medical history, surgical history, medications, allergies, family history and social history reviewed with patient today and changes made to appropriate areas of the chart.   Review of Systems - none All other ROS negative except what is listed above and in the HPI.      Objective:    BP 134/77   Pulse 66   Temp 98.2 F (36.8 C) (Oral)   Ht 5' (1.524 m)   Wt 184 lb (83.5 kg)   SpO2 98%   BMI 35.94 kg/m   Wt Readings from Last 3 Encounters:  06/22/19 184 lb (83.5 kg)  05/05/19 182 lb (82.6 kg)  10/29/18 186 lb 8 oz (84.6 kg)    Physical Exam Vitals signs and nursing note reviewed.  Constitutional:      General: She is awake. She is not in acute distress.    Appearance: She is well-developed. She is obese. She is not ill-appearing.  HENT:     Head: Normocephalic.     Right Ear: Hearing, tympanic membrane, ear canal and external ear normal.     Left Ear: Hearing, tympanic membrane, ear canal and external ear normal.     Nose: Nose normal.     Mouth/Throat:     Mouth: Mucous membranes are moist.     Pharynx: Oropharynx is clear.  Eyes:     General: Lids are normal.        Right eye: No discharge.        Left eye: No discharge.     Extraocular Movements: Extraocular movements intact.     Conjunctiva/sclera: Conjunctivae  normal.     Pupils: Pupils are equal, round, and reactive to light.     Visual Fields: Right eye visual fields normal and left eye visual fields normal.  Neck:     Musculoskeletal: Normal range of motion and neck supple.     Thyroid: No thyromegaly.     Vascular: No carotid bruit.  Cardiovascular:     Rate and Rhythm: Normal rate and regular rhythm.     Heart sounds: Normal heart sounds. No murmur. No gallop.   Pulmonary:     Effort: Pulmonary effort is normal. No accessory muscle usage or respiratory distress.     Breath sounds: Normal breath sounds.  Chest:     Breasts:        Right: Normal. No swelling, bleeding, inverted nipple, mass, nipple discharge, skin change or tenderness.        Left: Normal. No swelling, bleeding, inverted nipple, mass, nipple discharge, skin change or tenderness.  Abdominal:     General: Bowel sounds are normal.     Palpations: Abdomen is soft. There is no hepatomegaly or splenomegaly.     Tenderness: There is no abdominal tenderness.  Musculoskeletal:     Right lower leg: No edema.     Left lower leg:  No edema.  Lymphadenopathy:     Cervical: No cervical adenopathy.     Upper Body:     Right upper body: No supraclavicular, axillary or pectoral adenopathy.     Left upper body: No supraclavicular, axillary or pectoral adenopathy.  Skin:    General: Skin is warm and dry.  Neurological:     Mental Status: She is alert and oriented to person, place, and time.     Cranial Nerves: Cranial nerves are intact.     Motor: Motor function is intact.     Gait: Gait is intact.     Deep Tendon Reflexes: Reflexes are normal and symmetric.     Reflex Scores:      Brachioradialis reflexes are 2+ on the right side and 2+ on the left side.      Patellar reflexes are 2+ on the right side and 2+ on the left side. Psychiatric:        Attention and Perception: Attention normal.        Mood and Affect: Mood normal.        Speech: Speech normal.        Behavior:  Behavior normal. Behavior is cooperative.        Thought Content: Thought content normal.        Cognition and Memory: Cognition normal.        Judgment: Judgment normal.    Diabetic Foot Exam - Simple   Simple Foot Form Visual Inspection No deformities, no ulcerations, no other skin breakdown bilaterally: Yes Sensation Testing Intact to touch and monofilament testing bilaterally: Yes Pulse Check Posterior Tibialis and Dorsalis pulse intact bilaterally: Yes Comments      Results for orders placed or performed in visit on 06/10/19  TSH  Result Value Ref Range   TSH 3.540 0.450 - 4.500 uIU/mL      Assessment & Plan:   Problem List Items Addressed This Visit      Cardiovascular and Mediastinum   Hypertension    Chronic, ongoing.  Continue current medication regimen and collaboration with cardiology.  Bp at goal today.      Relevant Medications   lovastatin (MEVACOR) 40 MG tablet     Respiratory   Multiple respiratory allergies   Relevant Medications   fluticasone (FLONASE) 50 MCG/ACT nasal spray     Endocrine   Hyperlipidemia associated with type 2 diabetes mellitus (HCC)    Chronic, ongoing.  Continue current medication regimen and adjust as needed.        Relevant Medications   lovastatin (MEVACOR) 40 MG tablet   Type 2 diabetes mellitus with stage 3 chronic kidney disease (HCC)    Chronic, ongoing with recent July A1C 7.3%.  Continue current medication regimen and adjust as needed.  Avoid hypoglycemia with advanced age, fall prevention.  Return in 6 months to visit with PCP.      Relevant Medications   lovastatin (MEVACOR) 40 MG tablet   Hypothyroidism    Chronic, stable with recent TSH in goal range.  Continue current medication regimen and adjust as needed based on labs.        Musculoskeletal and Integument   Idiopathic thrombocytopenia (HCC)    Seen by hematology in 2019 with recommendation to monitor CBC Q6MOS, last check July 2020.  Is to return to  hematology if PLT <30.  Continue to monitor, next due January 2021.       Other Visit Diagnoses    Encounter for annual physical exam    -  Primary   Hyperlipidemia, unspecified hyperlipidemia type       Relevant Medications   lovastatin (MEVACOR) 40 MG tablet   Flu vaccine need       Relevant Orders   Flu Vaccine QUAD High Dose(Fluad) (Completed)       Follow up plan: Return in about 6 months (around 12/20/2019) for T2DM, HTN/HLD (Dr. Jeananne Rama).   LABORATORY TESTING:  - Pap smear: not applicable  IMMUNIZATIONS:   - Tdap: Tetanus vaccination status reviewed: last tetanus booster within 10 years. - Influenza: Up to date - Pneumovax: Up to date - Prevnar: Up to date - HPV: Not applicable - Zostavax vaccine: Refused  SCREENING: -Mammogram: Up to date , scheduled for tomorrow - Colonoscopy: Up to date  - Bone Density: Up to date  -Hearing Test: Not applicable  -Spirometry: Not applicable   PATIENT COUNSELING:   Advised to take 1 mg of folate supplement per day if capable of pregnancy.   Sexuality: Discussed sexually transmitted diseases, partner selection, use of condoms, avoidance of unintended pregnancy  and contraceptive alternatives.   Advised to avoid cigarette smoking.  I discussed with the patient that most people either abstain from alcohol or drink within safe limits (<=14/week and <=4 drinks/occasion for males, <=7/weeks and <= 3 drinks/occasion for females) and that the risk for alcohol disorders and other health effects rises proportionally with the number of drinks per week and how often a drinker exceeds daily limits.  Discussed cessation/primary prevention of drug use and availability of treatment for abuse.   Diet: Encouraged to adjust caloric intake to maintain  or achieve ideal body weight, to reduce intake of dietary saturated fat and total fat, to limit sodium intake by avoiding high sodium foods and not adding table salt, and to maintain adequate dietary  potassium and calcium preferably from fresh fruits, vegetables, and low-fat dairy products.    stressed the importance of regular exercise  Injury prevention: Discussed safety belts, safety helmets, smoke detector, smoking near bedding or upholstery.   Dental health: Discussed importance of regular tooth brushing, flossing, and dental visits.    NEXT PREVENTATIVE PHYSICAL DUE IN 1 YEAR. Return in about 6 months (around 12/20/2019) for T2DM, HTN/HLD (Dr. Jeananne Rama).

## 2019-06-22 NOTE — Assessment & Plan Note (Signed)
Chronic, stable with recent TSH in goal range.  Continue current medication regimen and adjust as needed based on labs.

## 2019-06-22 NOTE — Assessment & Plan Note (Signed)
Chronic, ongoing.  Continue current medication regimen and collaboration with cardiology.  Bp at goal today.

## 2019-06-22 NOTE — Assessment & Plan Note (Signed)
Chronic, ongoing.  Continue current medication regimen and adjust as needed.   

## 2019-06-22 NOTE — Patient Instructions (Signed)
Carbohydrate Counting for Diabetes Mellitus, Adult  Carbohydrate counting is a method of keeping track of how many carbohydrates you eat. Eating carbohydrates naturally increases the amount of sugar (glucose) in the blood. Counting how many carbohydrates you eat helps keep your blood glucose within normal limits, which helps you manage your diabetes (diabetes mellitus). It is important to know how many carbohydrates you can safely have in each meal. This is different for every person. A diet and nutrition specialist (registered dietitian) can help you make a meal plan and calculate how many carbohydrates you should have at each meal and snack. Carbohydrates are found in the following foods:  Grains, such as breads and cereals.  Dried beans and soy products.  Starchy vegetables, such as potatoes, peas, and corn.  Fruit and fruit juices.  Milk and yogurt.  Sweets and snack foods, such as cake, cookies, candy, chips, and soft drinks. How do I count carbohydrates? There are two ways to count carbohydrates in food. You can use either of the methods or a combination of both. Reading "Nutrition Facts" on packaged food The "Nutrition Facts" list is included on the labels of almost all packaged foods and beverages in the [place] It includes:  The serving size.  Information about nutrients in each serving, including the grams (g) of carbohydrate per serving. To use the "Nutrition Facts":  Decide how many servings you will have.  Multiply the number of servings by the number of carbohydrates per serving.  The resulting number is the total amount of carbohydrates that you will be having. Learning standard serving sizes of other foods When you eat carbohydrate foods that are not packaged or do not include "Nutrition Facts" on the label, you need to measure the servings in order to count the amount of carbohydrates:  Measure the foods that you will eat with a food scale or measuring cup, if needed.   Decide how many standard-size servings you will eat.  Multiply the number of servings by 15. Most carbohydrate-rich foods have about 15 g of carbohydrates per serving. ? For example, if you eat 8 oz (170 g) of strawberries, you will have eaten 2 servings and 30 g of carbohydrates (2 servings x 15 g = 30 g).  For foods that have more than one food mixed, such as soups and casseroles, you must count the carbohydrates in each food that is included. The following list contains standard serving sizes of common carbohydrate-rich foods. Each of these servings has about 15 g of carbohydrates:   hamburger bun or  English muffin.   oz (15 mL) syrup.   oz (14 g) jelly.  1 slice of bread.  1 six-inch tortilla.  3 oz (85 g) cooked rice or pasta.  4 oz (113 g) cooked dried beans.  4 oz (113 g) starchy vegetable, such as peas, corn, or potatoes.  4 oz (113 g) hot cereal.  4 oz (113 g) mashed potatoes or  of a large baked potato.  4 oz (113 g) canned or frozen fruit.  4 oz (120 mL) fruit juice.  4-6 crackers.  6 chicken nuggets.  6 oz (170 g) unsweetened dry cereal.  6 oz (170 g) plain fat-free yogurt or yogurt sweetened with artificial sweeteners.  8 oz (240 mL) milk.  8 oz (170 g) fresh fruit or one small piece of fruit.  24 oz (680 g) popped popcorn. Example of carbohydrate counting Sample meal  3 oz (85 g) chicken breast.  6 oz (170 g)   brown rice.  4 oz (113 g) corn.  8 oz (240 mL) milk.  8 oz (170 g) strawberries with sugar-free whipped topping. Carbohydrate calculation 1. Identify the foods that contain carbohydrates: ? Rice. ? Corn. ? Milk. ? Strawberries. 2. Calculate how many servings you have of each food: ? 2 servings rice. ? 1 serving corn. ? 1 serving milk. ? 1 serving strawberries. 3. Multiply each number of servings by 15 g: ? 2 servings rice x 15 g = 30 g. ? 1 serving corn x 15 g = 15 g. ? 1 serving milk x 15 g = 15 g. ? 1 serving  strawberries x 15 g = 15 g. 4. Add together all of the amounts to find the total grams of carbohydrates eaten: ? 30 g + 15 g + 15 g + 15 g = 75 g of carbohydrates total. Summary  Carbohydrate counting is a method of keeping track of how many carbohydrates you eat.  Eating carbohydrates naturally increases the amount of sugar (glucose) in the blood.  Counting how many carbohydrates you eat helps keep your blood glucose within normal limits, which helps you manage your diabetes.  A diet and nutrition specialist (registered dietitian) can help you make a meal plan and calculate how many carbohydrates you should have at each meal and snack. This information is not intended to replace advice given to you by your health care provider. Make sure you discuss any questions you have with your health care provider. Document Released: 09/30/2005 Document Revised: 04/24/2017 Document Reviewed: 03/13/2016 Elsevier Patient Education  2020 Elsevier Inc.  

## 2019-06-23 ENCOUNTER — Ambulatory Visit
Admission: RE | Admit: 2019-06-23 | Discharge: 2019-06-23 | Disposition: A | Discharge status: Home / Self Care | Payer: PPO | Source: Ambulatory Visit | Attending: Family Medicine | Admitting: Family Medicine

## 2019-06-23 DIAGNOSIS — Z1231 Encounter for screening mammogram for malignant neoplasm of breast: Secondary | ICD-10-CM | POA: Diagnosis not present

## 2019-06-24 ENCOUNTER — Encounter: Payer: Self-pay | Admitting: Family Medicine

## 2019-06-28 DIAGNOSIS — H401132 Primary open-angle glaucoma, bilateral, moderate stage: Secondary | ICD-10-CM | POA: Diagnosis not present

## 2019-06-28 LAB — HM DIABETES EYE EXAM

## 2019-07-01 ENCOUNTER — Ambulatory Visit
Admission: RE | Admit: 2019-07-01 | Discharge: 2019-07-01 | Disposition: A | Discharge status: Home / Self Care | Payer: PPO | Source: Ambulatory Visit | Attending: Nephrology | Admitting: Nephrology

## 2019-07-01 ENCOUNTER — Other Ambulatory Visit: Payer: Self-pay

## 2019-07-01 DIAGNOSIS — N2889 Other specified disorders of kidney and ureter: Secondary | ICD-10-CM | POA: Insufficient documentation

## 2019-07-01 DIAGNOSIS — N200 Calculus of kidney: Secondary | ICD-10-CM | POA: Diagnosis not present

## 2019-07-01 DIAGNOSIS — N281 Cyst of kidney, acquired: Secondary | ICD-10-CM | POA: Diagnosis not present

## 2019-07-13 ENCOUNTER — Ambulatory Visit (INDEPENDENT_AMBULATORY_CARE_PROVIDER_SITE_OTHER): Payer: PPO | Admitting: Pharmacist

## 2019-07-13 DIAGNOSIS — E1122 Type 2 diabetes mellitus with diabetic chronic kidney disease: Secondary | ICD-10-CM | POA: Diagnosis not present

## 2019-07-13 DIAGNOSIS — N183 Chronic kidney disease, stage 3 (moderate): Secondary | ICD-10-CM | POA: Diagnosis not present

## 2019-07-13 NOTE — Chronic Care Management (AMB) (Signed)
Chronic Care Management   Follow Up Note   07/13/2019 Name: Julia Nielsen MRN: [id number] DOB: 22-Nov-1940  Referred by: Guadalupe Maple, MD Reason for referral : Chronic Care Management (Medication Management)   Julia Nielsen is a 78 y.o. year old female who is a primary care patient of Crissman, Jeannette How, MD. The CCM team was consulted for assistance with chronic disease management and care coordination needs.    Contacted patient telephonically for medication management review.  Review of patient status, including review of consultants reports, relevant laboratory and other test results, and collaboration with appropriate care team members and the patient's provider was performed as part of comprehensive patient evaluation and provision of chronic care management services.    SDOH (Social Determinants of Health) screening performed today: Physical Activity. See Care Plan for related entries.   Advanced Directives Status: N See Care Plan and Vynca application for related entries.  Outpatient Encounter Medications as of 07/13/2019  Medication Sig Note  . amLODipine (NORVASC) 10 MG tablet Take 1 tablet (10 mg total) by mouth daily. 07/13/2019: QPM  . benazepril (LOTENSIN) 40 MG tablet Take 1 tablet (40 mg total) by mouth daily. 07/13/2019: QAM  . brimonidine (ALPHAGAN) 0.2 % ophthalmic solution  03/27/2015: Received from: External Pharmacy  . carvedilol (COREG) 25 MG tablet Take 1 tablet (25 mg total) by mouth 2 (two) times daily with a meal.   . cloNIDine (CATAPRES) 0.1 MG tablet Take 1 tablet (0.1 mg total) by mouth 2 (two) times daily.   . dorzolamide-timolol (COSOPT) 22.3-6.8 MG/ML ophthalmic solution dorzolamide 22.3 mg-timolol 6.8 mg/mL eye drops   . Dulaglutide (TRULICITY) A999333 0000000 SOPN Inject 0.75 mg into the skin once a week.   . fluticasone (FLONASE) 50 MCG/ACT nasal spray Use 2 spray(s) in each nostril once daily   . furosemide (LASIX) 20 MG tablet Take 20 mg by mouth  daily.   Marland Kitchen glipiZIDE (GLUCOTROL) 5 MG tablet Take 1 tablet (5 mg total) by mouth 2 (two) times daily before a meal.   . glucose blood (ONETOUCH ULTRA) test strip 1 each by Other route as needed for other. Use as instructed   . latanoprost (XALATAN) 0.005 % ophthalmic solution    . levothyroxine (SYNTHROID) 150 MCG tablet Take 1 tablet (150 mcg total) by mouth daily before breakfast.   . lovastatin (MEVACOR) 40 MG tablet Take 1 tablet (40 mg total) by mouth at bedtime.   [name] DELICA LANCETS 99991111 MISC USE AS DIRECTED ONCE DAILY WITH TEST STRIPS E11.22    No facility-administered encounter medications on file as of 07/13/2019.      Goals Addressed            This Visit's Progress     Patient Stated   . "I want to improve my diabetes" (pt-stated)       Current Barriers:  . Diabetes: uncontrolled; most recent A1c 7.3%. o Pleased with normal results of recent mammogram o Has f/u with Dr. Juleen China upcoming to discuss results of recent renal ultrasound.  . Current antihyperglycemic regimen: Trulicity A999333 mg, glipizide 10 mg QPM o Notes continued tolerability of Trulicity at this time . Denies hypoglycemic symptoms, including dizziness, lightheadedness, shaking, sweating, even with taking glipizide 10 mg at bedtime . Current blood glucose readings: fasting: 160-180s; has not been checking any other times recently  . Cardiovascular risk reduction: o Current hypertensive regimen: carvedilol 25 mg BID, clonidine 0.1 mg BID, benazepril 40 mg QAM, amlodipine 10 mg daily,  furosemide 20 mg daily; BP: generally at goal <140/80 per patient home readings  o Current hyperlipidemia regimen: lovastatin 40 mg; last LDL well controlled at 56  Pharmacist Clinical Goal(s):  Marland Kitchen Over the next 90 days, patient with work with PharmD and primary care provider to address optimized medication management  Interventions: . Comprehensive medication review performed, medication list updated in electronic medical  record . Discussed goal A1c <7%, goal fasting glucose <130 and goal 2 hour post prandial glucose <180. Discussed that she will be due for A1c end of October. Will collaborate with Dr. Jeananne Rama for scheduling labwork; if A1c not at goal at that time, consider increasing Trulicity to 1.5 mg daily and d/c glipizide to prevent overnight hypoglycemia.  . Will plan to discuss Ca/Vit D needs moving forward d/t dx osteopenia.   Patient Self Care Activities:  . Patient will check blood glucose BID, document, and provide at future appointments . Patient will take medications as prescribed . Patient will contact provider with any episodes of hypoglycemia . Patient will report any questions or concerns to provider   Please see past updates related to this goal by clicking on the "Past Updates" button in the selected goal          Plan:  - Will outreach patient for continued medication management review in ~4-6 weeks  [name], PharmD Clinical Pharmacist [place] [phone]

## 2019-07-13 NOTE — Patient Instructions (Signed)
Visit Information  Goals Addressed            This Visit's Progress     Patient Stated   . "I want to improve my diabetes" (pt-stated)       Current Barriers:  . Diabetes: uncontrolled; most recent A1c 7.3%. o Pleased with normal results of recent mammogram o Has f/u with Dr. Juleen China upcoming to discuss results of recent renal ultrasound.  . Current antihyperglycemic regimen: Trulicity A999333 mg, glipizide 10 mg QPM o Notes continued tolerability of Trulicity at this time . Denies hypoglycemic symptoms, including dizziness, lightheadedness, shaking, sweating, even with taking glipizide 10 mg at bedtime . Current blood glucose readings: fasting: 160-180s; has not been checking any other times recently  . Cardiovascular risk reduction: o Current hypertensive regimen: carvedilol 25 mg BID, clonidine 0.1 mg BID, benazepril 40 mg QAM, amlodipine 10 mg daily, furosemide 20 mg daily; BP: generally at goal <140/80 per patient home readings  o Current hyperlipidemia regimen: lovastatin 40 mg; last LDL well controlled at 56  Pharmacist Clinical Goal(s):  Marland Kitchen Over the next 90 days, patient with work with PharmD and primary care provider to address optimized medication management  Interventions: . Comprehensive medication review performed, medication list updated in electronic medical record . Discussed goal A1c <7%, goal fasting glucose <130 and goal 2 hour post prandial glucose <180. Discussed that she will be due for A1c end of October. Will collaborate with Dr. Jeananne Rama for scheduling labwork; if A1c not at goal at that time, consider increasing Trulicity to 1.5 mg daily and d/c glipizide to prevent overnight hypoglycemia.   Patient Self Care Activities:  . Patient will check blood glucose BID, document, and provide at future appointments . Patient will take medications as prescribed . Patient will contact provider with any episodes of hypoglycemia . Patient will report any questions or  concerns to provider   Please see past updates related to this goal by clicking on the "Past Updates" button in the selected goal         The patient verbalized understanding of instructions provided today and declined a print copy of patient instruction materials.    Plan:  - Will outreach patient for continued medication management review in ~4-6 weeks  [name], PharmD Clinical Pharmacist [place] [phone]

## 2019-07-27 DIAGNOSIS — D631 Anemia in chronic kidney disease: Secondary | ICD-10-CM | POA: Diagnosis not present

## 2019-07-27 DIAGNOSIS — E1122 Type 2 diabetes mellitus with diabetic chronic kidney disease: Secondary | ICD-10-CM | POA: Diagnosis not present

## 2019-07-27 DIAGNOSIS — N2581 Secondary hyperparathyroidism of renal origin: Secondary | ICD-10-CM | POA: Diagnosis not present

## 2019-07-27 DIAGNOSIS — R809 Proteinuria, unspecified: Secondary | ICD-10-CM | POA: Diagnosis not present

## 2019-07-27 DIAGNOSIS — N1832 Chronic kidney disease, stage 3b: Secondary | ICD-10-CM | POA: Diagnosis not present

## 2019-07-27 DIAGNOSIS — N2889 Other specified disorders of kidney and ureter: Secondary | ICD-10-CM | POA: Diagnosis not present

## 2019-07-27 DIAGNOSIS — I129 Hypertensive chronic kidney disease with stage 1 through stage 4 chronic kidney disease, or unspecified chronic kidney disease: Secondary | ICD-10-CM | POA: Diagnosis not present

## 2019-07-29 DIAGNOSIS — R600 Localized edema: Secondary | ICD-10-CM | POA: Diagnosis not present

## 2019-07-29 DIAGNOSIS — N1832 Chronic kidney disease, stage 3b: Secondary | ICD-10-CM | POA: Diagnosis not present

## 2019-07-29 DIAGNOSIS — E782 Mixed hyperlipidemia: Secondary | ICD-10-CM | POA: Diagnosis not present

## 2019-07-29 DIAGNOSIS — I6523 Occlusion and stenosis of bilateral carotid arteries: Secondary | ICD-10-CM | POA: Diagnosis not present

## 2019-07-29 DIAGNOSIS — I10 Essential (primary) hypertension: Secondary | ICD-10-CM | POA: Diagnosis not present

## 2019-07-29 DIAGNOSIS — I2510 Atherosclerotic heart disease of native coronary artery without angina pectoris: Secondary | ICD-10-CM | POA: Diagnosis not present

## 2019-08-03 ENCOUNTER — Other Ambulatory Visit: Payer: Self-pay

## 2019-08-03 DIAGNOSIS — N200 Calculus of kidney: Secondary | ICD-10-CM

## 2019-08-03 DIAGNOSIS — E782 Mixed hyperlipidemia: Secondary | ICD-10-CM | POA: Insufficient documentation

## 2019-08-06 ENCOUNTER — Ambulatory Visit (INDEPENDENT_AMBULATORY_CARE_PROVIDER_SITE_OTHER): Payer: PPO | Admitting: Urology

## 2019-08-06 ENCOUNTER — Encounter: Payer: Self-pay | Admitting: Urology

## 2019-08-06 ENCOUNTER — Other Ambulatory Visit: Payer: Self-pay

## 2019-08-06 ENCOUNTER — Other Ambulatory Visit
Admission: RE | Admit: 2019-08-06 | Discharge: 2019-08-06 | Disposition: A | Discharge status: Home / Self Care | Payer: PPO | Attending: Urology | Admitting: Urology

## 2019-08-06 VITALS — BP 128/67 | HR 78 | Ht 62.0 in | Wt 182.0 lb

## 2019-08-06 DIAGNOSIS — N2889 Other specified disorders of kidney and ureter: Secondary | ICD-10-CM | POA: Diagnosis not present

## 2019-08-06 DIAGNOSIS — R3129 Other microscopic hematuria: Secondary | ICD-10-CM

## 2019-08-06 DIAGNOSIS — N200 Calculus of kidney: Secondary | ICD-10-CM | POA: Diagnosis not present

## 2019-08-06 LAB — URINALYSIS, COMPLETE (UACMP) WITH MICROSCOPIC
Bilirubin Urine: NEGATIVE
Glucose, UA: NEGATIVE mg/dL
Ketones, ur: NEGATIVE mg/dL
Nitrite: NEGATIVE
Specific Gravity, Urine: 1.015 (ref 1.005–1.030)
pH: 5 (ref 5.0–8.0)

## 2019-08-06 NOTE — Progress Notes (Signed)
08/06/2019 9:01 AM   Julia Nielsen Aug 26, 1941 [id number]  Referring provider: Guadalupe Maple, MD [address] [place],  [place] [address]  Chief Complaint  Patient presents with  . Nephrolithiasis    New Patient    HPI: 78 year old female who presents today for further evaluation of renal mass.  She underwent an ultrasound ordered by Dr. Juleen China for further evaluation of chronic kidney disease (baseline creatinine 1.2).  Renal ultrasound on 07/01/2019 indicated the possibility of an approximately 2.1 x 2.1 x 2.2 solid-appearing right renal mass versus lobulated normal renal parenchyma contour.  There are also some moderately complex bilateral renal cyst yet along with a 7 mm left renal stone.  She denies any flank pain.  No issues with kidney stones recently.  She does have remote history of kidney stone issues.  She is followed by Dr. Yves Dill in the remote past in the 80s.  She had shockwave lithotripsy to her right kidney at that time.  Is not had any issues since.  No family history of renal malignancy.  She denies any urinary issues including no dysuria or UTIs.   PMH: Past Medical History:  Diagnosis Date  . Arthritis    OSTEO OF KNEE  . Breast cancer ([place]) 2000   left breast cancer, radiation  . CAD (coronary artery disease)   . Carotid artery stenosis    bilateral  . Cataract   . Chronic kidney disease    Chronic stage 3  . Coronary heart disease   . Diabetes mellitus without complication ([place])   . Edema of both legs   . GERD (gastroesophageal reflux disease)   . Glaucoma   . Hyperlipemia    mixed  . Hyperlipidemia   . Hypertension   . Hypothyroidism   . Mitral insufficiency    moderate  . Osteopenia   . Osteopenia   . Stenosis of carotid artery   . Thrombocytopenia ([place])   . Tricuspid insufficiency    moderate    Surgical History: Past Surgical History:  Procedure Laterality Date  . APPENDECTOMY    . BLADDER SURGERY    . BREAST EXCISIONAL  BIOPSY Left 2000   breast ca  . BREAST LUMPECTOMY    . BREAST SURGERY    . CARDIAC CATHETERIZATION    . CARPAL TUNNEL RELEASE Bilateral   . CHOLECYSTECTOMY    . COLONOSCOPY    . COLONOSCOPY WITH PROPOFOL N/A 08/12/2018   Procedure: COLONOSCOPY WITH PROPOFOL;  Surgeon: Manya Silvas, MD;  Location: [facility] ENDOSCOPY;  Service: Endoscopy;  Laterality: N/A;  . JOINT REPLACEMENT Right 2015   knee  . parathyroid surgery    . SHOULDER SURGERY Right   . TUBAL LIGATION      Home Medications:  Allergies as of 08/06/2019   No Known Allergies     Medication List       Accurate as of August 06, 2019  9:01 AM. If you have any questions, ask your nurse or doctor.        amLODipine 10 MG tablet Commonly known as: NORVASC Take 1 tablet (10 mg total) by mouth daily.   benazepril 40 MG tablet Commonly known as: LOTENSIN Take 1 tablet (40 mg total) by mouth daily.   brimonidine 0.2 % ophthalmic solution Commonly known as: ALPHAGAN   carvedilol 25 MG tablet Commonly known as: COREG Take 1 tablet (25 mg total) by mouth 2 (two) times daily with a meal.   cloNIDine 0.1 MG tablet Commonly  known as: CATAPRES Take 1 tablet (0.1 mg total) by mouth 2 (two) times daily.   dorzolamide-timolol 22.3-6.8 MG/ML ophthalmic solution Commonly known as: COSOPT dorzolamide 22.3 mg-timolol 6.8 mg/mL eye drops   fluticasone 50 MCG/ACT nasal spray Commonly known as: FLONASE Use 2 spray(s) in each nostril once daily   furosemide 20 MG tablet Commonly known as: LASIX Take 20 mg by mouth daily.   glipiZIDE 5 MG tablet Commonly known as: GLUCOTROL Take 1 tablet (5 mg total) by mouth 2 (two) times daily before a meal.   latanoprost 0.005 % ophthalmic solution Commonly known as: XALATAN   levothyroxine 150 MCG tablet Commonly known as: SYNTHROID Take 1 tablet (150 mcg total) by mouth daily before breakfast.   lovastatin 40 MG tablet Commonly known as: MEVACOR Take 1 tablet (40 mg total) by  mouth at bedtime.   OneTouch Delica Lancets 99991111 Misc USE AS DIRECTED ONCE DAILY WITH TEST STRIPS E11.22   OneTouch Ultra test strip Generic drug: glucose blood 1 each by Other route as needed for other. Use as instructed   Trulicity A999333 0000000 Sopn Generic drug: Dulaglutide Inject 0.75 mg into the skin once a week.       Allergies: No Known Allergies  Family History: Family History  Problem Relation Age of Onset  . Gallbladder disease Mother   . Aneurysm Mother   . COPD Father   . Congestive Heart Failure Father   . Emphysema Father   . Leukemia Sister   . Stroke Brother   . Dementia Brother   . Alzheimer's disease Brother   . Congestive Heart Failure Sister   . Emphysema Sister   . Breast cancer Neg Hx     Social History:  reports that she has never smoked. She has never used smokeless tobacco. She reports that she does not drink alcohol or use drugs.  ROS: UROLOGY Frequent Urination?: No Hard to postpone urination?: No Burning/pain with urination?: No Get up at night to urinate?: No Leakage of urine?: No Urine stream starts and stops?: No Trouble starting stream?: No Do you have to strain to urinate?: No Blood in urine?: No Urinary tract infection?: No Sexually transmitted disease?: No Injury to kidneys or bladder?: No Painful intercourse?: No Weak stream?: No Currently pregnant?: No Vaginal bleeding?: No Last menstrual period?: n  Gastrointestinal Nausea?: No Vomiting?: No Indigestion/heartburn?: No Diarrhea?: No Constipation?: No  Constitutional Fever: No Night sweats?: No Weight loss?: No Fatigue?: No  Skin Skin rash/lesions?: No Itching?: No  Eyes Blurred vision?: No Double vision?: No  Ears/Nose/Throat Sore throat?: No Sinus problems?: No  Hematologic/Lymphatic Swollen glands?: No Easy bruising?: No  Cardiovascular Leg swelling?: No Chest pain?: No  Respiratory Cough?: No Shortness of breath?: No  Endocrine  Excessive thirst?: No  Musculoskeletal Back pain?: No Joint pain?: No  Neurological Headaches?: No Dizziness?: No  Psychologic Depression?: No Anxiety?: No  Physical Exam: BP 128/67   Pulse 78   Ht 5\' 2"  (1.575 m)   Wt 182 lb (82.6 kg)   BMI 33.29 kg/m   Constitutional:  Alert and oriented, No acute distress.  Ambulating with cane.  Accompanied by husband today. HEENT: [place] AT, moist mucus membranes.  Trachea midline, no masses. Cardiovascular: No clubbing, cyanosis, or edema. Respiratory: Normal respiratory effort, no increased work of breathing. GI: Abdomen is obese. Skin: No rashes, bruises or suspicious lesions. Neurologic: Grossly intact, no focal deficits, moving all 4 extremities. Psychiatric: Normal mood and affect.  Laboratory Data: Lab Results  Component Value Date   WBC 4.1 05/07/2019   HGB 13.2 05/07/2019   HCT 40.6 05/07/2019   MCV 90 05/07/2019   PLT 82 (LL) 05/07/2019    Lab Results  Component Value Date   CREATININE 1.20 (H) 05/07/2019    Lab Results  Component Value Date   HGBA1C 7.3 (H) 05/07/2019    Urinalysis Component     Latest Ref Rng & Units 08/06/2019  Color, Urine     YELLOW YELLOW  Appearance     CLEAR CLEAR  Specific Gravity, Urine     1.005 - 1.030 1.015  pH     5.0 - 8.0 5.0  Glucose, UA     NEGATIVE mg/dL NEGATIVE  Hgb urine dipstick     NEGATIVE TRACE (A)  Bilirubin Urine     NEGATIVE NEGATIVE  Ketones, ur     NEGATIVE mg/dL NEGATIVE  Protein     NEGATIVE mg/dL TRACE (A)  Nitrite     NEGATIVE NEGATIVE  Leukocytes,Ua     NEGATIVE SMALL (A)  Squamous Epithelial / LPF     0 - 5 0-5  WBC, UA     0 - 5 WBC/hpf 11-20  RBC / HPF     0 - 5 RBC/hpf 6-10  Bacteria, UA     NONE SEEN FEW (A)     Pertinent Imaging: Results for orders placed during the hospital encounter of 07/01/19  US RENAL   Narrative CLINICAL DATA:  78 year old female with concern for renal mass.  EXAM: RENAL / URINARY TRACT ULTRASOUND  COMPLETE  COMPARISON:  Renal ultrasound dated 03/24/2018  FINDINGS: Right Kidney:  Renal measurements: 10.5 x 5.2 x 4.5 cm = volume: 130 mL. Normal echogenicity. No hydronephrosis or shadowing stone. There is a 2.0 x 1.9 x 1.9 cm hypoechoic lesion in the upper pole the right kidney with no definite internal vascularity, likely a septated cyst. A 2.1 x 2.1 x 2.2 cm solid-appearing lesion in the interpolar right kidney with internal vascularity noted. This most likely represents a normal renal parenchyma with lobulated contour. This is similar to prior ultrasound. An additional 1.0 x 0.8 x 0.9 cm cyst is noted in the inferior pole of the right kidney. There is no hydronephrosis or shadowing stone.  Left Kidney:  Renal measurements: 8.5 x 3.9 x 4.0 cm = volume: 69 mL. Normal echogenicity. There is a 7 mm nonobstructing interpolar calculus. No hydronephrosis . There is a 1.3 x 0.8 x 0.7 cm cyst from the mid/lower pole similar to prior ultrasound. A 4.5 x 4.3 x 2.9 cm exophytic lower pole cyst is noted.  Bladder:  Appears normal for degree of bladder distention.  IMPRESSION: 1. Bilateral renal cysts similar to prior ultrasound. A solid appearing lesion in the interpolar aspect of the right kidney likely represents normal renal parenchyma with lobulated margin. Follow-up with ultrasound or further evaluation with MRI is recommended. 2. A 7 mm left renal stone.  No hydronephrosis.   Electronically Signed   By: Anner Crete M.D.   On: 07/01/2019 16:41    Renal ultrasound personally reviewed today  Assessment & Plan:    1. Right renal mass We discussed the renal ultrasound findings, possibility of a small right renal mass although may also be normal anatomic variant  In light of her renal cyst and possible renal mass, I recommended that she pursue CT of the abdomen with and without contrast for further diagnostic evaluation.  She is agreeable this plan. - CT  Abd Wo & W  Cm; Future  2. Microscopic hematuria Suspect this may be related to collection technique Plan to recheck at next visit  3. Kidney stones Asymptomatic nonobstructing renal stone, stable from previous year No intervention recommended  Follow-up in 1 month to review images, repeat urinalysis  Hollice Espy, MD  [facility] [address] [place], [place] [address] [phone]

## 2019-08-24 ENCOUNTER — Telehealth: Payer: Self-pay | Admitting: Family Medicine

## 2019-08-24 NOTE — Telephone Encounter (Signed)
Called pt to schedule appt, no answer, left vm 

## 2019-08-24 NOTE — Telephone Encounter (Signed)
Please schedule appointment.

## 2019-08-24 NOTE — Telephone Encounter (Signed)
Pt had called back was scheduled on a virtual day where she prefers in office. Called pt back she is scheduled for 09/16/2019 8:30.

## 2019-08-24 NOTE — Telephone Encounter (Signed)
Copied from [place] [phone]. Topic: General - Other >> Aug 24, 2019  1:47 PM Wynetta Emery, [place] C wrote: Reason for CRM: pt called in to follow up, pt says that she is due to have her A1C checked due to starting Trulicity. Please assist pt with order for check.

## 2019-08-30 ENCOUNTER — Ambulatory Visit: Payer: PPO | Admitting: Family Medicine

## 2019-09-01 ENCOUNTER — Ambulatory Visit: Payer: PPO | Admitting: Pharmacist

## 2019-09-01 DIAGNOSIS — N183 Chronic kidney disease, stage 3 unspecified: Secondary | ICD-10-CM

## 2019-09-01 NOTE — Chronic Care Management (AMB) (Signed)
Chronic Care Management   Follow Up Note   09/01/2019 Name: Julia Nielsen MRN: [id number] DOB: 06-13-1941  Referred by: Guadalupe Maple, MD Reason for referral : Chronic Care Management (Medication Management)   Julia Nielsen is a 78 y.o. year old female who is a primary care patient of Crissman, Jeannette How, MD. The CCM team was consulted for assistance with chronic disease management and care coordination needs.    Contacted patient for medication management review.  Review of patient status, including review of consultants reports, relevant laboratory and other test results, and collaboration with appropriate care team members and the patient's provider was performed as part of comprehensive patient evaluation and provision of chronic care management services.    SDOH (Social Determinants of Health) screening performed today: Financial Strain  Depression  . See Care Plan for related entries.   Outpatient Encounter Medications as of 09/01/2019  Medication Sig Note  . amLODipine (NORVASC) 10 MG tablet Take 1 tablet (10 mg total) by mouth daily. 07/13/2019: QPM  . benazepril (LOTENSIN) 40 MG tablet Take 1 tablet (40 mg total) by mouth daily. 07/13/2019: QAM  . carvedilol (COREG) 25 MG tablet Take 1 tablet (25 mg total) by mouth 2 (two) times daily with a meal.   . cloNIDine (CATAPRES) 0.1 MG tablet Take 1 tablet (0.1 mg total) by mouth 2 (two) times daily.   . Dulaglutide (TRULICITY) A999333 0000000 SOPN Inject 0.75 mg into the skin once a week.   . furosemide (LASIX) 20 MG tablet Take 20 mg by mouth daily.   Marland Kitchen glipiZIDE (GLUCOTROL) 5 MG tablet Take 1 tablet (5 mg total) by mouth 2 (two) times daily before a meal.   . glucose blood (ONETOUCH ULTRA) test strip 1 each by Other route as needed for other. Use as instructed   . levothyroxine (SYNTHROID) 150 MCG tablet Take 1 tablet (150 mcg total) by mouth daily before breakfast.   . lovastatin (MEVACOR) 40 MG tablet Take 1 tablet (40 mg total)  by mouth at bedtime.   [name] DELICA LANCETS 99991111 MISC USE AS DIRECTED ONCE DAILY WITH TEST STRIPS E11.22   . brimonidine (ALPHAGAN) 0.2 % ophthalmic solution    . dorzolamide-timolol (COSOPT) 22.3-6.8 MG/ML ophthalmic solution dorzolamide 22.3 mg-timolol 6.8 mg/mL eye drops   . fluticasone (FLONASE) 50 MCG/ACT nasal spray Use 2 spray(s) in each nostril once daily   . latanoprost (XALATAN) 0.005 % ophthalmic solution     No facility-administered encounter medications on file as of 09/01/2019.      Goals Addressed            This Visit's Progress     Patient Stated   . "I want to improve my diabetes" (pt-stated)       Current Barriers:  . Diabetes: uncontrolled; most recent A1c 7.3%. . Current antihyperglycemic regimen: Trulicity A999333 mg, glipizide 10 mg QPM o Notes that she continues to endorse weight loss with Trulicity  o Patient notes that she is dealing with stress right now d/t cancer diagnosis and treatment for her daughter, who lives in [place] . Denies hypoglycemic symptoms, including dizziness, lightheadedness, shaking, sweating, even with taking glipizide 10 mg at bedtime. Unlikely that this medication is doing anything, or patient would be having low blood sugar  . Current blood glucose readings:  o Fasting: 160s-190s; Reports that prior to Trulicity, fastings were in the 200s. . Cardiovascular risk reduction: o Current hypertensive regimen: carvedilol 25 mg BID, clonidine 0.1 mg BID, benazepril  40 mg QAM, amlodipine 10 mg daily, furosemide 20 mg daily; BP: reports home SBP readings 110-120s  o Current hyperlipidemia regimen: lovastatin 40 mg; last LDL well controlled at 56  Pharmacist Clinical Goal(s):  Marland Kitchen Over the next 90 days, patient with work with PharmD and primary care provider to address optimized medication management  Interventions: . Comprehensive medication review performed, medication list updated in electronic medical record . Provided empathetic listening  regarding patient's stress for her daughter. Will evaluate need/willingness for LCSW referral in the future . Discussed goal A1c <7%, goal fasting glucose <130 and goal 2 hour post prandial glucose <180. Discussed that moving forward, would recommend discontinuing glipizide, and potentially increase Trulicity if needed. Has f/u with Dr. Wynetta Emery on 09/16/2019 and is due for A1c.  . Dx osteopenia. Discuss need for f/u DEXA and current Ca + vit D intake   . Will plan to reapply for Trulicity assistance for 2021  Patient Self Care Activities:  . Patient will check blood glucose BID, document, and provide at future appointments . Patient will take medications as prescribed . Patient will contact provider with any episodes of hypoglycemia . Patient will report any questions or concerns to provider   Please see past updates related to this goal by clicking on the "Past Updates" button in the selected goal          Plan: - Will outreach patient in ~6 weeks for continued medication management support  [name], PharmD Clinical Pharmacist [place] [phone]

## 2019-09-01 NOTE — Patient Instructions (Signed)
Visit Information  Goals Addressed            This Visit's Progress     Patient Stated   . "I want to improve my diabetes" (pt-stated)       Current Barriers:  . Diabetes: uncontrolled; most recent A1c 7.3%. . Current antihyperglycemic regimen: Trulicity A999333 mg, glipizide 10 mg QPM o Notes that she continues to endorse weight loss with Trulicity  o Patient notes that she is dealing with stress right now d/t cancer diagnosis and treatment for her daughter, who lives in [place] . Denies hypoglycemic symptoms, including dizziness, lightheadedness, shaking, sweating, even with taking glipizide 10 mg at bedtime. Unlikely that this medication is doing anything, or patient would be having low blood sugar  . Current blood glucose readings:  o Fasting: 160s-190s; Reports that prior to Trulicity, fastings were in the 200s. . Cardiovascular risk reduction: o Current hypertensive regimen: carvedilol 25 mg BID, clonidine 0.1 mg BID, benazepril 40 mg QAM, amlodipine 10 mg daily, furosemide 20 mg daily; BP: reports home SBP readings 110-120s  o Current hyperlipidemia regimen: lovastatin 40 mg; last LDL well controlled at 56  Pharmacist Clinical Goal(s):  Marland Kitchen Over the next 90 days, patient with work with PharmD and primary care provider to address optimized medication management  Interventions: . Comprehensive medication review performed, medication list updated in electronic medical record . Provided empathetic listening regarding patient's stress for her daughter. Will evaluate need/willingness for LCSW referral in the future . Discussed goal A1c <7%, goal fasting glucose <130 and goal 2 hour post prandial glucose <180. Discussed that moving forward, would recommend discontinuing glipizide, and potentially increase Trulicity if needed. Has f/u with Dr. Wynetta Emery on 09/16/2019 and is due for A1c.  . Dx osteopenia. Discuss need for f/u DEXA and current Ca + vit D intake   . Will plan to reapply for Trulicity  assistance for 2021  Patient Self Care Activities:  . Patient will check blood glucose BID, document, and provide at future appointments . Patient will take medications as prescribed . Patient will contact provider with any episodes of hypoglycemia . Patient will report any questions or concerns to provider   Please see past updates related to this goal by clicking on the "Past Updates" button in the selected goal         The patient verbalized understanding of instructions provided today and declined a print copy of patient instruction materials.   Plan: - Will outreach patient in ~6 weeks for continued medication management support  [name], PharmD Clinical Pharmacist [place] [phone]

## 2019-09-02 ENCOUNTER — Ambulatory Visit
Admission: RE | Admit: 2019-09-02 | Discharge: 2019-09-02 | Disposition: A | Discharge status: Home / Self Care | Payer: PPO | Source: Ambulatory Visit | Attending: Urology | Admitting: Urology

## 2019-09-02 ENCOUNTER — Other Ambulatory Visit: Payer: Self-pay

## 2019-09-02 DIAGNOSIS — K7460 Unspecified cirrhosis of liver: Secondary | ICD-10-CM | POA: Diagnosis not present

## 2019-09-02 DIAGNOSIS — N2889 Other specified disorders of kidney and ureter: Secondary | ICD-10-CM | POA: Insufficient documentation

## 2019-09-02 LAB — POCT I-STAT CREATININE: Creatinine, Ser: 1.3 mg/dL — ABNORMAL HIGH (ref 0.44–1.00)

## 2019-09-02 MED ORDER — IOHEXOL 300 MG/ML  SOLN: 100.0000 mL | Freq: Once | INTRAMUSCULAR | Status: DC | PRN

## 2019-09-02 MED ORDER — IOHEXOL 300 MG/ML  SOLN
75.0000 mL | Freq: Once | INTRAMUSCULAR | Status: AC | PRN
  Administered 2019-09-02: 14:00:00 75 mL via INTRAVENOUS

## 2019-09-07 ENCOUNTER — Other Ambulatory Visit: Payer: Self-pay

## 2019-09-07 ENCOUNTER — Ambulatory Visit: Payer: PPO | Admitting: Urology

## 2019-09-07 ENCOUNTER — Encounter: Payer: Self-pay | Admitting: Urology

## 2019-09-07 VITALS — BP 161/79 | HR 85 | Ht 62.0 in | Wt 182.0 lb

## 2019-09-07 DIAGNOSIS — K7460 Unspecified cirrhosis of liver: Secondary | ICD-10-CM | POA: Diagnosis not present

## 2019-09-07 DIAGNOSIS — R3129 Other microscopic hematuria: Secondary | ICD-10-CM | POA: Diagnosis not present

## 2019-09-07 DIAGNOSIS — N281 Cyst of kidney, acquired: Secondary | ICD-10-CM | POA: Diagnosis not present

## 2019-09-07 DIAGNOSIS — N2889 Other specified disorders of kidney and ureter: Secondary | ICD-10-CM | POA: Diagnosis not present

## 2019-09-07 NOTE — Progress Notes (Signed)
09/07/2019 2:02 PM   Julia Nielsen Kandis Cocking 12/27/1940 [id number]  Referring provider: Guadalupe Maple, MD [address] [place],  [place] [address]  Chief Complaint  Patient presents with  . Follow-up    HPI: 78 year old female who returns today for follow-up of CT scan.  She previously underwent renal ultrasound on 06/2019 which showed some concern for a solid-appearing right upper pole renal mass.  She returns today with definitive imaging in the form of CT abdomen with and without contrast.  She has multiple simple renal cyst.  The lesion in question in the right upper pole shows a 1.6 x 1.5 cm lesion with a possible fine/thin septum without enhancement.  No solid lesions were identified bilaterally.  Incidentally, the CT scan did show a stigmata of cirrhosis with signs of portal hypertension including recanalization of the umbilical vein and splenomegaly.  She has no known history of cirrhosis.  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  She also returns today with repeat urinalysis.  At her initial visit a month ago, she had incidental both WBCs and RBCs but thought to be related to poor collection technique.  Previous urinalysis have been negative for blood.  Repeat urinalysis today is also negative for blood.  She denies any urinary symptoms today including dysuria or gross hematuria.   PMH: Past Medical History:  Diagnosis Date  . Arthritis    OSTEO OF KNEE  . Breast cancer ([place]) 2000   left breast cancer, radiation tx's.  Marland Kitchen CAD (coronary artery disease)   . Carotid artery stenosis    bilateral  . Cataract   . Chronic kidney disease    Chronic stage 3  . Coronary heart disease   . Diabetes mellitus without complication ([place])   . Edema of both legs   . GERD (gastroesophageal reflux disease)   . Glaucoma   . Hyperlipemia    mixed  . Hyperlipidemia   . Hypertension   . Hypothyroidism   . Mitral insufficiency    moderate  . Osteopenia   .  Osteopenia   . Stenosis of carotid artery   . Thrombocytopenia ([place])   . Tricuspid insufficiency    moderate    Surgical History: Past Surgical History:  Procedure Laterality Date  . APPENDECTOMY    . BLADDER SURGERY    . BREAST EXCISIONAL BIOPSY Left 2000   breast ca  . BREAST LUMPECTOMY    . BREAST SURGERY    . CARDIAC CATHETERIZATION    . CARPAL TUNNEL RELEASE Bilateral   . CHOLECYSTECTOMY    . COLONOSCOPY    . COLONOSCOPY WITH PROPOFOL N/A 08/12/2018   Procedure: COLONOSCOPY WITH PROPOFOL;  Surgeon: Manya Silvas, MD;  Location: [facility] ENDOSCOPY;  Service: Endoscopy;  Laterality: N/A;  . JOINT REPLACEMENT Right 2015   knee  . parathyroid surgery    . SHOULDER SURGERY Right   . TUBAL LIGATION      Home Medications:  Allergies as of 09/07/2019   No Known Allergies     Medication List       Accurate as of September 07, 2019  2:02 PM. If you have any questions, ask your nurse or doctor.        amLODipine 10 MG tablet Commonly known as: NORVASC Take 1 tablet (10 mg total) by mouth daily.   benazepril 40 MG tablet Commonly known as: LOTENSIN Take 1 tablet (40 mg total) by mouth daily.   brimonidine 0.2 % ophthalmic solution  Commonly known as: ALPHAGAN   carvedilol 25 MG tablet Commonly known as: COREG Take 1 tablet (25 mg total) by mouth 2 (two) times daily with a meal.   cloNIDine 0.1 MG tablet Commonly known as: CATAPRES Take 1 tablet (0.1 mg total) by mouth 2 (two) times daily.   dorzolamide-timolol 22.3-6.8 MG/ML ophthalmic solution Commonly known as: COSOPT dorzolamide 22.3 mg-timolol 6.8 mg/mL eye drops   fluticasone 50 MCG/ACT nasal spray Commonly known as: FLONASE Use 2 spray(s) in each nostril once daily   furosemide 20 MG tablet Commonly known as: LASIX Take 20 mg by mouth daily.   glipiZIDE 5 MG tablet Commonly known as: GLUCOTROL Take 1 tablet (5 mg total) by mouth 2 (two) times daily before a meal.   latanoprost 0.005 %  ophthalmic solution Commonly known as: XALATAN   levothyroxine 150 MCG tablet Commonly known as: SYNTHROID Take 1 tablet (150 mcg total) by mouth daily before breakfast.   lovastatin 40 MG tablet Commonly known as: MEVACOR Take 1 tablet (40 mg total) by mouth at bedtime.   OneTouch Delica Lancets 99991111 Misc USE AS DIRECTED ONCE DAILY WITH TEST STRIPS E11.22   OneTouch Ultra test strip Generic drug: glucose blood 1 each by Other route as needed for other. Use as instructed   Trulicity A999333 0000000 Sopn Generic drug: Dulaglutide Inject 0.75 mg into the skin once a week.       Allergies: No Known Allergies  Family History: Family History  Problem Relation Age of Onset  . Gallbladder disease Mother   . Aneurysm Mother   . COPD Father   . Congestive Heart Failure Father   . Emphysema Father   . Leukemia Sister   . Stroke Brother   . Dementia Brother   . Alzheimer's disease Brother   . Congestive Heart Failure Sister   . Emphysema Sister   . Breast cancer Neg Hx     Social History:  reports that she has never smoked. She has never used smokeless tobacco. She reports that she does not drink alcohol or use drugs.  ROS: UROLOGY Frequent Urination?: No Hard to postpone urination?: No Burning/pain with urination?: No Get up at night to urinate?: No Leakage of urine?: No Urine stream starts and stops?: No Trouble starting stream?: No Do you have to strain to urinate?: No Blood in urine?: No Urinary tract infection?: No Sexually transmitted disease?: No Injury to kidneys or bladder?: No Painful intercourse?: No Weak stream?: No Currently pregnant?: No Vaginal bleeding?: No Last menstrual period?: N  Gastrointestinal Nausea?: No Vomiting?: No Indigestion/heartburn?: No Diarrhea?: No Constipation?: No  Constitutional Fever: No Night sweats?: No Weight loss?: No Fatigue?: No  Skin Skin rash/lesions?: No Itching?: No  Eyes Blurred vision?: No Double  vision?: No  Ears/Nose/Throat Sore throat?: No Sinus problems?: No  Hematologic/Lymphatic Swollen glands?: No Easy bruising?: No  Cardiovascular Leg swelling?: No Chest pain?: No  Respiratory Cough?: No Shortness of breath?: No  Endocrine Excessive thirst?: No  Musculoskeletal Back pain?: No Joint pain?: No  Neurological Headaches?: No Dizziness?: No  Psychologic Depression?: No Anxiety?: No  Physical Exam: BP (!) 161/79   Pulse 85   Ht 5\' 2"  (1.575 m)   Wt 182 lb (82.6 kg)   BMI 33.29 kg/m   Constitutional:  Alert and oriented, No acute distress.  Accompanied by husband today.  In wheelchair. HEENT: [place] AT, moist mucus membranes.  Trachea midline, no masses. Cardiovascular: No clubbing, cyanosis, or edema. Respiratory: Normal respiratory effort, no  increased work of breathing. GI: Abdomen is soft, nontender, nondistended, no abdominal masses, obese Skin: No rashes, bruises or suspicious lesions. Neurologic: Grossly intact, no focal deficits, moving all 4 extremities. Psychiatric: Normal mood and affect.  Laboratory Data: Lab Results  Component Value Date   WBC 4.1 05/07/2019   HGB 13.2 05/07/2019   HCT 40.6 05/07/2019   MCV 90 05/07/2019   PLT 82 (LL) 05/07/2019    Lab Results  Component Value Date   CREATININE 1.30 (H) 09/02/2019     Lab Results  Component Value Date   HGBA1C 7.3 (H) 05/07/2019    Urinalysis UA today with WBCs but no RBCs, otherwise asymptomatic.  See epic for details.  Imaging CLINICAL DATA:  Question of solid renal mass discovered on ultrasound. History of kidney stones and left breast cancer  EXAM: CT ABDOMEN WITHOUT AND WITH CONTRAST  TECHNIQUE: Multidetector CT imaging of the abdomen was performed following the standard protocol before and following the bolus administration of intravenous contrast.  CONTRAST:  33mL OMNIPAQUE IOHEXOL 300 MG/ML  SOLN  COMPARISON:  Renal sonogram dated 07/01/2019   FINDINGS: Lower chest: Basilar atelectasis, no signs of consolidation or evidence of pleural effusion.  Hepatobiliary: Marked hepatic nodularity with signs of portal hypertension including recanalized umbilical vein. No signs of focal, suspicious hepatic lesion. Portal vein is patent.  Hepatic arterial supply arising from the celiac axis and classic fashion. No signs of splenic artery aneurysm. Spleen is mildly enlarged at 13 cm greatest craniocaudal dimension.  Post cholecystectomy without signs of biliary ductal dilation.  Pancreas: Unremarkable. No pancreatic ductal dilatation or surrounding inflammatory changes.  Spleen: Mild splenomegaly 13 cm greatest craniocaudal dimension. Perisplenic varices with small splenorenal shunt extending along the posterior margin the stomach.  Adrenals/Urinary Tract: Adrenal glands are normal.  Marked left renal cortical scarring. Large cyst arising from posterior left kidney measures 3.6 by 3.6 cm. Smaller low-density foci in the upper pole are also likely small cysts, the lateral of the 2, too small to characterize. The largest in the upper pole measures approximately 1 cm.  Small cyst in the upper pole the right kidney. Interpolar right kidney with a crisply marginated low-density lesion measuring 1.6 x 1.5 cm. Post-contrast density 22 Hounsfield units, precontrast density 14 Hounsfield units. Suggestion of very thin, less than 2 mm internal septations. Additional small low-density foci in the lower pole the right kidney also too small for definitive characterization but likely cysts as well.  No signs of hydronephrosis with patulous left ureter showing diffuse nonnodular urothelial enhancement  Stomach/Bowel: No acute findings related to the gastrointestinal tract. Pelvic bowel loops are not imaged.  Vascular/Lymphatic: Vascular structures in the abdomen are patent with scattered atherosclerotic changes. Specifically  portal vein and SMV are patent in the setting of portal hypertension. No signs of adenopathy in the retroperitoneum or in the upper abdomen.  Other: Hazy changes in the mesentery may relate to low-level edema in the setting of portal hypertension. Trace ascites about the liver and along the left pericolic gutter.  Musculoskeletal: Spinal degenerative changes without acute or destructive bone process.  IMPRESSION: 1. There is a 1.6 x 1.5 cm crisply marginated low-density lesion in the upper pole of the right kidney with a thin, less than 2 mm internal septations. Findings favor hemorrhagic or proteinaceous cyst benign appearance. 2. Marked left renal cortical scarring. 3. Cirrhosis with signs of portal hypertension as described. Note that the liver is markedly nodular there is frank recanalization of the umbilical vein  with mild splenomegaly. 4. Hazy changes in the mesentery may relate to low-level edema in the setting of portal hypertension. 5. Atherosclerosis. 6. These results will be called to the ordering clinician or representative by the Radiologist Assistant, and communication documented in the PACS or zVision Dashboard.  Aortic Atherosclerosis (ICD10-I70.0).   Electronically Signed   By: Zetta Bills M.D.   On: 09/02/2019 15:33  Assessment & Plan:    1. Renal cyst, right CT scan personally reviewed, no evidence of solid renal mass  She does have a Bosniak 2 renal cyst in her right upper pole which is small with a thin nonenhancing septation.  We discussed that these lesions are considered benign and do not need serial evaluation.  She is agreeable this plan.  She will continue to follow-up with Dr. Juleen China. - Urinalysis, Complete  2. Cirrhosis of liver without ascites, unspecified hepatic cirrhosis type ([place]) Incidental stigmata of cirrhosis and portal hypertension to which she has no known diagnosis  I will CC her primary care, Dr. Wynetta Emery to decide  whether or not she needs any follow-up with liver specialist, etc.  3. Microscopic hematuria Considerable microscopic blood at last visit, however based on the sample specimens, suspect this was related to collection technique.  Urinalysis today is reassuring without blood.  Additionally, she had no blood on previous urinalysis in the recent past.  I have urged her that if blood is ever seen in her urine, she should return for cystoscopy.  She is agreeable this plan.   F/U AS NEEDED  Hollice Espy, MD  [facility] [address] [place], [place] [address] [phone]

## 2019-09-08 LAB — URINALYSIS, COMPLETE
Bilirubin, UA: NEGATIVE
Glucose, UA: NEGATIVE
Ketones, UA: NEGATIVE
Nitrite, UA: NEGATIVE
Protein,UA: NEGATIVE
Specific Gravity, UA: 1.015 (ref 1.005–1.030)
Urobilinogen, Ur: 0.2 mg/dL (ref 0.2–1.0)
pH, UA: 5 (ref 5.0–7.5)

## 2019-09-08 LAB — MICROSCOPIC EXAMINATION: WBC, UA: >30 /hpf — AB (ref 0–5)

## 2019-09-14 ENCOUNTER — Other Ambulatory Visit: Payer: Self-pay

## 2019-09-16 ENCOUNTER — Ambulatory Visit (INDEPENDENT_AMBULATORY_CARE_PROVIDER_SITE_OTHER): Payer: PPO | Admitting: Family Medicine

## 2019-09-16 ENCOUNTER — Other Ambulatory Visit: Payer: Self-pay

## 2019-09-16 ENCOUNTER — Encounter: Payer: Self-pay | Admitting: Family Medicine

## 2019-09-16 VITALS — BP 120/77 | HR 80 | Temp 98.4°F

## 2019-09-16 DIAGNOSIS — E1122 Type 2 diabetes mellitus with diabetic chronic kidney disease: Secondary | ICD-10-CM | POA: Diagnosis not present

## 2019-09-16 DIAGNOSIS — N183 Chronic kidney disease, stage 3 unspecified: Secondary | ICD-10-CM

## 2019-09-16 DIAGNOSIS — K7460 Unspecified cirrhosis of liver: Secondary | ICD-10-CM | POA: Diagnosis not present

## 2019-09-16 DIAGNOSIS — N281 Cyst of kidney, acquired: Secondary | ICD-10-CM

## 2019-09-16 DIAGNOSIS — I700 Atherosclerosis of aorta: Secondary | ICD-10-CM | POA: Diagnosis not present

## 2019-09-16 LAB — BAYER DCA HB A1C WAIVED: HB A1C (BAYER DCA - WAIVED): 6.6 % (ref <7.0)

## 2019-09-16 NOTE — Assessment & Plan Note (Signed)
Will keep BP, cholesterol and sugars under good control. Continue current regimen. Continue to monitor. Call with any concerns.

## 2019-09-16 NOTE — Assessment & Plan Note (Signed)
Found incidentally on CT scan. 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 Will check hepatitis screen next visit. No symptoms. No swelling. Continue to monitor.

## 2019-09-16 NOTE — Assessment & Plan Note (Signed)
Has followed with urology- no need for follow up. Call with any concerns.

## 2019-09-16 NOTE — Assessment & Plan Note (Signed)
Under good control on current regimen with A1c of 6.6. Continue current regimen. Continue to monitor. Call with any concerns. Refills up to date. Recheck 3 months.

## 2019-09-16 NOTE — Progress Notes (Signed)
BP 120/77   Pulse 80   Temp 98.4 F (36.9 C)   SpO2 98%    Subjective:    Patient ID: Julia Nielsen, female    DOB: [birth year], 78 y.o.   MRN: [id number]  HPI: Julia Nielsen is a 78 y.o. female  Chief Complaint  Patient presents with  . Diabetes   DIABETES Hypoglycemic episodes:no Polydipsia/polyuria: no Visual disturbance: no Chest pain: no Paresthesias: no Glucose Monitoring: yes  Accucheck frequency: Daily  Fasting glucose: 150-160s Blood Pressure Monitoring: not checking Retinal Examination: Up to Date Foot Exam: Done today Diabetic Education: Completed Pneumovax: Up to Date Influenza: Up to Date Aspirin: no  Relevant past medical, surgical, family and social history reviewed and updated as indicated. Interim medical history since our last visit reviewed. Allergies and medications reviewed and updated.  Review of Systems  Constitutional: Negative.   Respiratory: Negative.   Cardiovascular: Negative.   Gastrointestinal: Negative.   Musculoskeletal: Negative.   Psychiatric/Behavioral: Negative.     Per HPI unless specifically indicated above     Objective:    BP 120/77   Pulse 80   Temp 98.4 F (36.9 C)   SpO2 98%   Wt Readings from Last 3 Encounters:  09/07/19 182 lb (82.6 kg)  08/06/19 182 lb (82.6 kg)  06/22/19 184 lb (83.5 kg)    Physical Exam Vitals signs and nursing note reviewed.  Constitutional:      General: She is not in acute distress.    Appearance: Normal appearance. She is not ill-appearing, toxic-appearing or diaphoretic.  HENT:     Head: Normocephalic and atraumatic.     Right Ear: External ear normal.     Left Ear: External ear normal.     Nose: Nose normal.     Mouth/Throat:     Mouth: Mucous membranes are moist.     Pharynx: Oropharynx is clear.  Eyes:     General: No scleral icterus.       Right eye: No discharge.        Left eye: No discharge.     Extraocular Movements: Extraocular movements intact.   Conjunctiva/sclera: Conjunctivae normal.     Pupils: Pupils are equal, round, and reactive to light.  Neck:     Musculoskeletal: Normal range of motion and neck supple.  Cardiovascular:     Rate and Rhythm: Normal rate and regular rhythm.     Pulses: Normal pulses.     Heart sounds: Normal heart sounds. No murmur. No friction rub. No gallop.   Pulmonary:     Effort: Pulmonary effort is normal. No respiratory distress.     Breath sounds: Normal breath sounds. No stridor. No wheezing, rhonchi or rales.  Chest:     Chest wall: No tenderness.  Musculoskeletal: Normal range of motion.  Skin:    General: Skin is warm and dry.     Capillary Refill: Capillary refill takes less than 2 seconds.     Coloration: Skin is not jaundiced or pale.     Findings: No bruising, erythema, lesion or rash.  Neurological:     General: No focal deficit present.     Mental Status: She is alert and oriented to person, place, and time. Mental status is at baseline.  Psychiatric:        Mood and Affect: Mood normal.        Behavior: Behavior normal.        Thought Content: Thought content normal.  Judgment: Judgment normal.     Results for orders placed or performed in visit on 09/07/19  Microscopic Examination   URINE  Result Value Ref Range   WBC, UA >30 (A) 0 - 5 /hpf   RBC 0-2 0 - 2 /hpf   Epithelial Cells (non renal) 0-10 0 - 10 /hpf   Bacteria, UA Moderate (A) None seen/Few  Urinalysis, Complete  Result Value Ref Range   Specific Gravity, UA 1.015 1.005 - 1.030   pH, UA 5.0 5.0 - 7.5   Color, UA Straw Yellow   Appearance Ur Cloudy (A) Clear   Leukocytes,UA 2+ (A) Negative   Protein,UA Negative Negative/Trace   Glucose, UA Negative Negative   Ketones, UA Negative Negative   RBC, UA Trace (A) Negative   Bilirubin, UA Negative Negative   Urobilinogen, Ur 0.2 0.2 - 1.0 mg/dL   Nitrite, UA Negative Negative   Microscopic Examination See below:       Assessment & Plan:   Problem List  Items Addressed This Visit      Cardiovascular and Mediastinum   Atherosclerosis of aorta ([place])    Will keep BP, cholesterol and sugars under good control. Continue current regimen. Continue to monitor. Call with any concerns.         Digestive   Cirrhosis of liver without ascites ([place])    Found incidentally on CT scan. 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 Will check hepatitis screen next visit. No symptoms. No swelling. Continue to monitor.         Endocrine   Type 2 diabetes mellitus with stage 3 chronic kidney disease ([employer]) - Primary    Under good control on current regimen with A1c of 6.6. Continue current regimen. Continue to monitor. Call with any concerns. Refills up to date. Recheck 3 months.        Relevant Orders   Bayer DCA Hb A1c Waived     Genitourinary   Renal cyst    Has followed with urology- no need for follow up. Call with any concerns.           Follow up plan: Return in about 3 months (around 12/15/2019) for 6 month follow up.

## 2019-10-19 ENCOUNTER — Ambulatory Visit (INDEPENDENT_AMBULATORY_CARE_PROVIDER_SITE_OTHER): Payer: PPO | Admitting: Pharmacist

## 2019-10-19 DIAGNOSIS — I2510 Atherosclerotic heart disease of native coronary artery without angina pectoris: Secondary | ICD-10-CM | POA: Diagnosis not present

## 2019-10-19 DIAGNOSIS — N183 Chronic kidney disease, stage 3 unspecified: Secondary | ICD-10-CM | POA: Diagnosis not present

## 2019-10-19 DIAGNOSIS — E1122 Type 2 diabetes mellitus with diabetic chronic kidney disease: Secondary | ICD-10-CM | POA: Diagnosis not present

## 2019-10-19 NOTE — Chronic Care Management (AMB) (Signed)
Chronic Care Management   Follow Up Note   10/19/2019 Name: Julia Nielsen MRN: [id number] DOB: [birth year]  Referred by: Valerie Roys, DO Reason for referral : Chronic Care Management (Medication Management)   Julia Nielsen is a 79 y.o. year old female who is a primary care patient of Valerie Roys, DO. The CCM team was consulted for assistance with chronic disease management and care coordination needs.    Received call from patient today for medication access needs.   Review of patient status, including review of consultants reports, relevant laboratory and other test results, and collaboration with appropriate care team members and the patient's provider was performed as part of comprehensive patient evaluation and provision of chronic care management services.    SDOH (Social Determinants of Health) screening performed today: Financial Strain . See Care Plan for related entries.   Outpatient Encounter Medications as of 10/19/2019  Medication Sig Note  . amLODipine (NORVASC) 10 MG tablet Take 1 tablet (10 mg total) by mouth daily. 07/13/2019: QPM  . benazepril (LOTENSIN) 40 MG tablet Take 1 tablet (40 mg total) by mouth daily. 07/13/2019: QAM  . carvedilol (COREG) 25 MG tablet Take 1 tablet (25 mg total) by mouth 2 (two) times daily with a meal.   . cloNIDine (CATAPRES) 0.1 MG tablet Take 1 tablet (0.1 mg total) by mouth 2 (two) times daily.   . Dulaglutide (TRULICITY) A999333 0000000 SOPN Inject 0.75 mg into the skin once a week.   Marland Kitchen glipiZIDE (GLUCOTROL) 5 MG tablet Take 1 tablet (5 mg total) by mouth 2 (two) times daily before a meal.   . lovastatin (MEVACOR) 40 MG tablet Take 1 tablet (40 mg total) by mouth at bedtime.   . brimonidine (ALPHAGAN) 0.2 % ophthalmic solution    . dorzolamide-timolol (COSOPT) 22.3-6.8 MG/ML ophthalmic solution dorzolamide 22.3 mg-timolol 6.8 mg/mL eye drops   . fluticasone (FLONASE) 50 MCG/ACT nasal spray Use 2 spray(s) in each nostril once daily     . furosemide (LASIX) 20 MG tablet Take 20 mg by mouth daily.   Marland Kitchen glucose blood (ONETOUCH ULTRA) test strip 1 each by Other route as needed for other. Use as instructed   . latanoprost (XALATAN) 0.005 % ophthalmic solution    . levothyroxine (SYNTHROID) 150 MCG tablet Take 1 tablet (150 mcg total) by mouth daily before breakfast.   . ONETOUCH DELICA LANCETS 99991111 MISC USE AS DIRECTED ONCE DAILY WITH TEST STRIPS E11.22    No facility-administered encounter medications on file as of 10/19/2019.     Goals Addressed            This Visit's Progress     Patient Stated   . "I want to improve my diabetes" (pt-stated)       Current Barriers:  . Diabetes: uncontrolled; most recent A1c 6.6%. . Current antihyperglycemic regimen: Trulicity A999333 mg, glipizide 10 mg QPM o Pleased with continued weight loss w/ Trulicity therapy o Received Trulicity through [employer]; due to reapply for 2021 . Denies hypoglycemic symptoms, including dizziness, lightheadedness, shaking, sweating, even with taking glipizide 10 mg at bedtime. Notes that she and Dr. Wynetta Emery plan to d/c glipizide at next visit if A1c continues to decline . Current blood glucose readings:  o Fasting: remaining well controlled <150 . Cardiovascular risk reduction: o Current hypertensive regimen: carvedilol 25 mg BID, clonidine 0.1 mg BID, benazepril 40 mg QAM, amlodipine 10 mg daily, furosemide 20 mg daily; BP: reports home SBP readings 110-120s  o  Current hyperlipidemia regimen: lovastatin 40 mg; last LDL well controlled at 56  Pharmacist Clinical Goal(s):  Marland Kitchen Over the next 90 days, patient with work with PharmD and primary care provider to address optimized medication management  Interventions: . Discussed plan for [id number] [employer] application. Patient will come by clinic to sign patient portion of application, and will bring 2021 SSA income information. Will collaborate w/ Dr. Wynetta Emery for her signature on application. Once all parts  received, will collaborate w/ Susy Frizzle, CPhT for submission and follow up.   Patient Self Care Activities:  . Patient will check blood glucose BID, document, and provide at future appointments . Patient will take medications as prescribed . Patient will contact provider with any episodes of hypoglycemia . Patient will report any questions or concerns to provider   Please see past updates related to this goal by clicking on the "Past Updates" button in the selected goal         Plan: - Will collaborate w/ patient, provider, and CPhT as above - Scheduled outreach 12/15/19 @ 11:30 am   [name], PharmD, [place] [phone]

## 2019-10-19 NOTE — Patient Instructions (Signed)
Visit Information  Goals Addressed            This Visit's Progress     Patient Stated   . "I want to improve my diabetes" (pt-stated)       Current Barriers:  . Diabetes: uncontrolled; most recent A1c 6.6%. . Current antihyperglycemic regimen: Trulicity A999333 mg, glipizide 10 mg QPM o Pleased with continued weight loss w/ Trulicity therapy o Received Trulicity through [employer]; due to reapply for 2021 . Denies hypoglycemic symptoms, including dizziness, lightheadedness, shaking, sweating, even with taking glipizide 10 mg at bedtime. Notes that she and Dr. Wynetta Emery plan to d/c glipizide at next visit if A1c continues to decline . Current blood glucose readings:  o Fasting: remaining well controlled <150 . Cardiovascular risk reduction: o Current hypertensive regimen: carvedilol 25 mg BID, clonidine 0.1 mg BID, benazepril 40 mg QAM, amlodipine 10 mg daily, furosemide 20 mg daily; BP: reports home SBP readings 110-120s  o Current hyperlipidemia regimen: lovastatin 40 mg; last LDL well controlled at 56  Pharmacist Clinical Goal(s):  Marland Kitchen Over the next 90 days, patient with work with PharmD and primary care provider to address optimized medication management  Interventions: . Discussed plan for [id number] [employer] application. Patient will come by clinic to sign patient portion of application, and will bring 2021 SSA income information. Will collaborate w/ Dr. Wynetta Emery for her signature on application. Once all parts received, will collaborate w/ Susy Frizzle, CPhT for submission and follow up.   Patient Self Care Activities:  . Patient will check blood glucose BID, document, and provide at future appointments . Patient will take medications as prescribed . Patient will contact provider with any episodes of hypoglycemia . Patient will report any questions or concerns to provider   Please see past updates related to this goal by clicking on the "Past Updates" button in the selected goal          The patient verbalized understanding of instructions provided today and declined a print copy of patient instruction materials.   Plan: - Will collaborate w/ patient, provider, and CPhT as above - Scheduled outreach 12/15/19 @ 11:30 am   [name], PharmD, [place] [phone]

## 2019-10-27 ENCOUNTER — Ambulatory Visit: Payer: Self-pay | Admitting: Pharmacist

## 2019-10-27 DIAGNOSIS — E1122 Type 2 diabetes mellitus with diabetic chronic kidney disease: Secondary | ICD-10-CM

## 2019-10-27 NOTE — Patient Instructions (Signed)
Visit Information  Goals Addressed            This Visit's Progress     Patient Stated   . "I want to improve my diabetes" (pt-stated)       Current Barriers:  . Diabetes: uncontrolled; most recent A1c 6.6%. . Current antihyperglycemic regimen: Trulicity A999333 mg, glipizide 10 mg QPM o Due to reapply for Trulicity for 123XX123 . Current blood glucose readings:  o Fasting: remaining well controlled <150 . Cardiovascular risk reduction: o Current hypertensive regimen: carvedilol 25 mg BID, clonidine 0.1 mg BID, benazepril 40 mg QAM, amlodipine 10 mg daily, furosemide 20 mg daily; BP: reports home SBP readings 110-120s  o Current hyperlipidemia regimen: lovastatin 40 mg; last LDL well controlled at 56  Pharmacist Clinical Goal(s):  Marland Kitchen Over the next 90 days, patient with work with PharmD and primary care provider to address optimized medication management  Interventions: . Received patient and provider portions of applications. Faxed to [employer]. Will collaborate w/ Susy Frizzle, CPhT for follow up  Patient Self Care Activities:  . Patient will check blood glucose BID, document, and provide at future appointments . Patient will take medications as prescribed . Patient will contact provider with any episodes of hypoglycemia . Patient will report any questions or concerns to provider   Please see past updates related to this goal by clicking on the "Past Updates" button in the selected goal         The patient verbalized understanding of instructions provided today and declined a print copy of patient instruction materials.   Plan: - Will collaborate w/ CPhT as above - Will outreach patient as previously scheduled  [name], PharmD, [place] [phone]

## 2019-10-27 NOTE — Chronic Care Management (AMB) (Signed)
Chronic Care Management   Follow Up Note   10/27/2019 Name: Julia Nielsen MRN: [id number] DOB: 13-Aug-1941  Referred by: Valerie Roys, DO Reason for referral : Chronic Care Management (Medication Management)   Julia Nielsen is a 79 y.o. year old female who is a primary care patient of Valerie Roys, DO. The CCM team was consulted for assistance with chronic disease management and care coordination needs.    Care coordination completed today.  Review of patient status, including review of consultants reports, relevant laboratory and other test results, and collaboration with appropriate care team members and the patient's provider was performed as part of comprehensive patient evaluation and provision of chronic care management services.    SDOH (Social Determinants of Health) screening performed today: Financial Strain . See Care Plan for related entries.   Outpatient Encounter Medications as of 10/27/2019  Medication Sig Note  . amLODipine (NORVASC) 10 MG tablet Take 1 tablet (10 mg total) by mouth daily. 07/13/2019: QPM  . benazepril (LOTENSIN) 40 MG tablet Take 1 tablet (40 mg total) by mouth daily. 07/13/2019: QAM  . brimonidine (ALPHAGAN) 0.2 % ophthalmic solution    . carvedilol (COREG) 25 MG tablet Take 1 tablet (25 mg total) by mouth 2 (two) times daily with a meal.   . cloNIDine (CATAPRES) 0.1 MG tablet Take 1 tablet (0.1 mg total) by mouth 2 (two) times daily.   . dorzolamide-timolol (COSOPT) 22.3-6.8 MG/ML ophthalmic solution dorzolamide 22.3 mg-timolol 6.8 mg/mL eye drops   . Dulaglutide (TRULICITY) A999333 0000000 SOPN Inject 0.75 mg into the skin once a week.   . fluticasone (FLONASE) 50 MCG/ACT nasal spray Use 2 spray(s) in each nostril once daily   . furosemide (LASIX) 20 MG tablet Take 20 mg by mouth daily.   Marland Kitchen glipiZIDE (GLUCOTROL) 5 MG tablet Take 1 tablet (5 mg total) by mouth 2 (two) times daily before a meal.   . glucose blood (ONETOUCH ULTRA) test strip 1 each  by Other route as needed for other. Use as instructed   . latanoprost (XALATAN) 0.005 % ophthalmic solution    . levothyroxine (SYNTHROID) 150 MCG tablet Take 1 tablet (150 mcg total) by mouth daily before breakfast.   . lovastatin (MEVACOR) 40 MG tablet Take 1 tablet (40 mg total) by mouth at bedtime.   [name] DELICA LANCETS 99991111 MISC USE AS DIRECTED ONCE DAILY WITH TEST STRIPS E11.22    No facility-administered encounter medications on file as of 10/27/2019.     Goals Addressed            This Visit's Progress     Patient Stated   . "I want to improve my diabetes" (pt-stated)       Current Barriers:  . Diabetes: uncontrolled; most recent A1c 6.6%. . Current antihyperglycemic regimen: Trulicity A999333 mg, glipizide 10 mg QPM o Due to reapply for Trulicity for 123XX123 . Current blood glucose readings:  o Fasting: remaining well controlled <150 . Cardiovascular risk reduction: o Current hypertensive regimen: carvedilol 25 mg BID, clonidine 0.1 mg BID, benazepril 40 mg QAM, amlodipine 10 mg daily, furosemide 20 mg daily; BP: reports home SBP readings 110-120s  o Current hyperlipidemia regimen: lovastatin 40 mg; last LDL well controlled at 56  Pharmacist Clinical Goal(s):  Marland Kitchen Over the next 90 days, patient with work with PharmD and primary care provider to address optimized medication management  Interventions: . Received patient and provider portions of applications. Faxed to [employer]. Will collaborate w/  Susy Frizzle, CPhT for follow up  Patient Self Care Activities:  . Patient will check blood glucose BID, document, and provide at future appointments . Patient will take medications as prescribed . Patient will contact provider with any episodes of hypoglycemia . Patient will report any questions or concerns to provider   Please see past updates related to this goal by clicking on the "Past Updates" button in the selected goal          Plan: - Will collaborate w/ CPhT as  above - Will outreach patient as previously scheduled  [name], PharmD, [place] [phone]

## 2019-10-28 ENCOUNTER — Other Ambulatory Visit: Payer: Self-pay | Admitting: Pharmacy Technician

## 2019-10-28 NOTE — Patient Outreach (Signed)
[facility]) Care Management  10/28/2019  Julia Nielsen 1941/09/26 [id number]   Received both patient and provider portion(s) of patient assistance application(s) for Trulicity. Faxed completed application and required documents into [employer].  Will follow up with company(ies) in 7-10 business days to check status of application(s).  Keane Martelli P. [name], [place] Management [phone]

## 2019-11-02 ENCOUNTER — Ambulatory Visit: Payer: Self-pay | Admitting: Pharmacist

## 2019-11-02 DIAGNOSIS — N183 Chronic kidney disease, stage 3 unspecified: Secondary | ICD-10-CM

## 2019-11-02 NOTE — Patient Instructions (Signed)
Visit Information  Goals Addressed            This Visit's Progress     Patient Stated   . "I want to improve my diabetes" (pt-stated)       Current Barriers:  . Diabetes: uncontrolled; most recent A1c 6.6%. . Current antihyperglycemic regimen: Trulicity A999333 mg, glipizide 10 mg QPM o Due to reapply for Trulicity for 123XX123. Application submitted [id number].  . Current blood glucose readings:  o Fasting: remaining well controlled <150 . Cardiovascular risk reduction: o Current hypertensive regimen: carvedilol 25 mg BID, clonidine 0.1 mg BID, benazepril 40 mg QAM, amlodipine 10 mg daily, furosemide 20 mg daily; BP: reports home SBP readings 110-120s  o Current hyperlipidemia regimen: lovastatin 40 mg; last LDL well controlled at 56  Pharmacist Clinical Goal(s):  Marland Kitchen Over the next 90 days, patient with work with PharmD and primary care provider to address optimized medication management  Interventions: . Received call from patient. She notes she received mail from [place] reminding her that enrollment ended 10/13/19. I updated her that her reapplication was submitted 10/28/19. She will let me know if she receives any new documentation stating renewal for 2021.   Patient Self Care Activities:  . Patient will check blood glucose BID, document, and provide at future appointments . Patient will take medications as prescribed . Patient will contact provider with any episodes of hypoglycemia . Patient will report any questions or concerns to provider   Please see past updates related to this goal by clicking on the "Past Updates" button in the selected goal         The patient verbalized understanding of instructions provided today and declined a print copy of patient instruction materials.   Plan:  - Will outreach patient as previously scheduled  [name], PharmD, [place]  [phone]

## 2019-11-02 NOTE — Chronic Care Management (AMB) (Signed)
Chronic Care Management   Follow Up Note   11/02/2019 Name: Julia Nielsen MRN: [id number] DOB: 06/03/1941  Referred by: Valerie Roys, DO Reason for referral : Chronic Care Management (Medication Management)   Julia Nielsen is a 79 y.o. year old female who is a primary care patient of Valerie Roys, DO. The CCM team was consulted for assistance with chronic disease management and care coordination needs.    Received call from patient today with a question about her Trulicity patient assistance reapplication  Review of patient status, including review of consultants reports, relevant laboratory and other test results, and collaboration with appropriate care team members and the patient's provider was performed as part of comprehensive patient evaluation and provision of chronic care management services.    SDOH (Social Determinants of Health) screening performed today: Financial Strain . See Care Plan for related entries.   Outpatient Encounter Medications as of 11/02/2019  Medication Sig Note  . amLODipine (NORVASC) 10 MG tablet Take 1 tablet (10 mg total) by mouth daily. 07/13/2019: QPM  . benazepril (LOTENSIN) 40 MG tablet Take 1 tablet (40 mg total) by mouth daily. 07/13/2019: QAM  . brimonidine (ALPHAGAN) 0.2 % ophthalmic solution    . carvedilol (COREG) 25 MG tablet Take 1 tablet (25 mg total) by mouth 2 (two) times daily with a meal.   . cloNIDine (CATAPRES) 0.1 MG tablet Take 1 tablet (0.1 mg total) by mouth 2 (two) times daily.   . dorzolamide-timolol (COSOPT) 22.3-6.8 MG/ML ophthalmic solution dorzolamide 22.3 mg-timolol 6.8 mg/mL eye drops   . Dulaglutide (TRULICITY) A999333 0000000 SOPN Inject 0.75 mg into the skin once a week.   . fluticasone (FLONASE) 50 MCG/ACT nasal spray Use 2 spray(s) in each nostril once daily   . furosemide (LASIX) 20 MG tablet Take 20 mg by mouth daily.   Marland Kitchen glipiZIDE (GLUCOTROL) 5 MG tablet Take 1 tablet (5 mg total) by mouth 2 (two) times daily  before a meal.   . glucose blood (ONETOUCH ULTRA) test strip 1 each by Other route as needed for other. Use as instructed   . latanoprost (XALATAN) 0.005 % ophthalmic solution    . levothyroxine (SYNTHROID) 150 MCG tablet Take 1 tablet (150 mcg total) by mouth daily before breakfast.   . lovastatin (MEVACOR) 40 MG tablet Take 1 tablet (40 mg total) by mouth at bedtime.   [name] DELICA LANCETS [id number] MISC USE AS DIRECTED ONCE DAILY WITH TEST STRIPS E11.22    No facility-administered encounter medications on file as of 11/02/2019.     Objective:   Goals Addressed            This Visit's Progress     Patient Stated   . "I want to improve my diabetes" (pt-stated)       Current Barriers:  . Diabetes: uncontrolled; most recent A1c 6.6%. . Current antihyperglycemic regimen: Trulicity A999333 mg, glipizide 10 mg QPM o Due to reapply for Trulicity for [id number]. Application submitted [id number].  . Current blood glucose readings:  o Fasting: remaining well controlled <150 . Cardiovascular risk reduction: o Current hypertensive regimen: carvedilol 25 mg BID, clonidine 0.1 mg BID, benazepril 40 mg QAM, amlodipine 10 mg daily, furosemide 20 mg daily; BP: reports home SBP readings 110-120s  o Current hyperlipidemia regimen: lovastatin 40 mg; last LDL well controlled at 56  Pharmacist Clinical Goal(s):  Marland Kitchen Over the next 90 days, patient with work with PharmD and primary care provider to address optimized medication management  Interventions: . Received call from patient. She notes she received mail from [place] reminding her that enrollment ended 10/13/19. I updated her that her reapplication was submitted 10/28/19. She will let me know if she receives any new documentation stating renewal for 2021.   Patient Self Care Activities:  . Patient will check blood glucose BID, document, and provide at future appointments . Patient will take medications as prescribed . Patient will contact provider with any  episodes of hypoglycemia . Patient will report any questions or concerns to provider   Please see past updates related to this goal by clicking on the "Past Updates" button in the selected goal          Plan:  - Will outreach patient as previously scheduled  [name], PharmD, [place] [phone]

## 2019-11-08 ENCOUNTER — Other Ambulatory Visit: Payer: Self-pay | Admitting: Pharmacy Technician

## 2019-11-08 NOTE — Patient Outreach (Signed)
[place] [facility]) Care Management  11/08/2019  EURETHA SROUFE 1941/03/09 [id number]   Care coordination call placed to [place] in regards to patient's application for Trulicity.  Spoke to [place] who informed patient was APPROVED 11/03/2019. Veronica informed the order was placed on 11/03/2019 and patient would be contacted to schedule her delivery. She informed this was all the information she had.  Will follow up with patient.  Ermal Haberer P. Lilliane Sposito, [place] Management [phone]

## 2019-11-08 NOTE — Patient Outreach (Signed)
[place] [facility]) Care Management  11/08/2019  Julia Nielsen May 06, 1941 [id number]   ADDENDUM  Successful outreach call placed to patient in regards to [place] application for Trulicity.  Spoke to patient, HIPAA identifiers verified.  Patient informed that the Trulicity would be delivered on 11/09/2019 per the phone call she had last week to set up her delivery. Informed patient to call Lilly at [phone] if she does not receive it tomorrow as well as for future refills. Patient verbalized understanding and confirmed having name and number.  Will route note to embedded [facility] RPh [name] that patient assistance has been completed and will remove myself from care team.  Luiz Ochoa. Caralynn Gelber, [place] Management [phone]

## 2019-11-10 ENCOUNTER — Ambulatory Visit: Payer: Self-pay | Admitting: Family Medicine

## 2019-12-15 ENCOUNTER — Ambulatory Visit (INDEPENDENT_AMBULATORY_CARE_PROVIDER_SITE_OTHER): Payer: PPO | Admitting: Pharmacist

## 2019-12-15 DIAGNOSIS — E1122 Type 2 diabetes mellitus with diabetic chronic kidney disease: Secondary | ICD-10-CM | POA: Diagnosis not present

## 2019-12-15 DIAGNOSIS — N183 Chronic kidney disease, stage 3 unspecified: Secondary | ICD-10-CM

## 2019-12-15 DIAGNOSIS — I10 Essential (primary) hypertension: Secondary | ICD-10-CM | POA: Diagnosis not present

## 2019-12-15 NOTE — Chronic Care Management (AMB) (Signed)
Chronic Care Management   Follow Up Note   12/15/2019 Name: Julia Nielsen MRN: [id number] DOB: 08/20/1941  Referred by: Valerie Roys, DO Reason for referral : Chronic Care Management (Medication Management)   Julia Nielsen is a 79 y.o. year old female who is a primary care patient of Valerie Roys, DO. The CCM team was consulted for assistance with chronic disease management and care coordination needs.    Contacted patient for medication management review.   Review of patient status, including review of consultants reports, relevant laboratory and other test results, and collaboration with appropriate care team members and the patient's provider was performed as part of comprehensive patient evaluation and provision of chronic care management services.    SDOH (Social Determinants of Health) assessments performed: No See Care Plan activities for detailed interventions related to [facility])     Outpatient Encounter Medications as of 12/15/2019  Medication Sig Note  . amLODipine (NORVASC) 10 MG tablet Take 1 tablet (10 mg total) by mouth daily. 07/13/2019: QPM  . benazepril (LOTENSIN) 40 MG tablet Take 1 tablet (40 mg total) by mouth daily. 07/13/2019: QAM  . BIOTIN PO Take 1 capsule by mouth daily.   . brimonidine (ALPHAGAN) 0.2 % ophthalmic solution    . carvedilol (COREG) 25 MG tablet Take 1 tablet (25 mg total) by mouth 2 (two) times daily with a meal.   . cholecalciferol (VITAMIN D3) 25 MCG (1000 UNIT) tablet Take 1,000 Units by mouth daily.   . cloNIDine (CATAPRES) 0.1 MG tablet Take 1 tablet (0.1 mg total) by mouth 2 (two) times daily.   . dorzolamide-timolol (COSOPT) 22.3-6.8 MG/ML ophthalmic solution dorzolamide 22.3 mg-timolol 6.8 mg/mL eye drops   . Dulaglutide (TRULICITY) 4.13 KG/4.0NU SOPN Inject 0.75 mg into the skin once a week.   . fexofenadine (ALLEGRA) 180 MG tablet Take 180 mg by mouth daily.   . fluticasone (FLONASE) 50 MCG/ACT nasal spray Use 2 spray(s) in each  nostril once daily   . furosemide (LASIX) 20 MG tablet Take 20 mg by mouth daily.   Marland Kitchen glipiZIDE (GLUCOTROL) 5 MG tablet Take 1 tablet (5 mg total) by mouth 2 (two) times daily before a meal. 12/15/2019: Taking 2 QAM  . glucose blood (ONETOUCH ULTRA) test strip 1 each by Other route as needed for other. Use as instructed   . latanoprost (XALATAN) 0.005 % ophthalmic solution    . levothyroxine (SYNTHROID) 150 MCG tablet Take 1 tablet (150 mcg total) by mouth daily before breakfast.   . lovastatin (MEVACOR) 40 MG tablet Take 1 tablet (40 mg total) by mouth at bedtime.   [name] DELICA LANCETS 27O MISC USE AS DIRECTED ONCE DAILY WITH TEST STRIPS E11.22    No facility-administered encounter medications on file as of 12/15/2019.     Objective:   Goals Addressed            This Visit's Progress     Patient Stated   . "I want to improve my diabetes" (pt-stated)       CARE PLAN ENTRY (see longtitudinal plan of care for additional care plan information)  Current Barriers:  . Diabetes: uncontrolled; most recent A1c 6.6%. . Current antihyperglycemic regimen: Trulicity 5.36 mg, glipizide 10 mg QPM o APPROVED for Trulicity assistance through [place] through 10/13/20 . Most recent eGFR: ~35 mL/min (per nephrology Dr. Juleen China) . Current blood glucose readings:  o Fasting: 150-170s  o Not checking 2 hour post prandial . Denies any episodes of hypoglycemia .  Cardiovascular risk reduction (follows w/ Dr. Clayborn Bigness [facility] Cardiology) o Current hypertensive regimen: carvedilol 25 mg BID, clonidine 0.1 mg BID, benazepril 40 mg QAM, amlodipine 10 mg daily, furosemide 20 mg daily; BP: reports BP 116/50; does report readings generally 110-120s/50-60s. Does report occasional episodes of dizziness upon standing, where she is afraid she is going to fall o Current hyperlipidemia regimen: lovastatin 40 mg; last LDL well controlled at 56  Pharmacist Clinical Goal(s):  Marland Kitchen Over the next 90 days, patient with work with  PharmD and primary care provider to address optimized medication management  Interventions: . Comprehensive medication review performed, medication list updated in electronic medical record . Reviewed risk of hypoglycemia w/ sulfonylureas. Encouraged to discuss d/c glipizide w/ Dr. Wynetta Emery at next appointment, especially since taking at night. If needed, can increase Trulitiy to 1.5 mg weekly to compensate . Discussed risk of hypotension, and risk of clonidine use in geriatric patients. Could be that with recent weight loss w/ Trulicity use that we need to reduce hypertensive regimen. Recommend d/c clonidine, and have patient continue to monitor BP regularly. Encouraged her to discuss w/ Dr. Wynetta Emery at upcoming appointment.  Patient Self Care Activities:  . Patient will check blood glucose BID, document, and provide at future appointments . Patient will take medications as prescribed . Patient will contact provider with any episodes of hypoglycemia . Patient will report any questions or concerns to provider   Please see past updates related to this goal by clicking on the "Past Updates" button in the selected goal          Plan:  - Scheduled f/u call 02/02/20  [name], PharmD, [place] [phone]

## 2019-12-15 NOTE — Patient Instructions (Signed)
Visit Information  Goals Addressed            This Visit's Progress     Patient Stated   . "I want to improve my diabetes" (pt-stated)       CARE PLAN ENTRY (see longtitudinal plan of care for additional care plan information)  Current Barriers:  . Diabetes: uncontrolled; most recent A1c 6.6%. . Current antihyperglycemic regimen: Trulicity 8.02 mg, glipizide 10 mg QPM o APPROVED for Trulicity assistance through [place] through 10/13/20 . Most recent eGFR: ~35 mL/min (per nephrology Dr. Juleen China) . Current blood glucose readings:  o Fasting: 150-170s  o Not checking 2 hour post prandial . Denies any episodes of hypoglycemia . Cardiovascular risk reduction (follows w/ Dr. Clayborn Bigness [facility] Cardiology) o Current hypertensive regimen: carvedilol 25 mg BID, clonidine 0.1 mg BID, benazepril 40 mg QAM, amlodipine 10 mg daily, furosemide 20 mg daily; BP: reports BP 116/50; does report readings generally 110-120s/50-60s. Does report occasional episodes of dizziness upon standing, where she is afraid she is going to fall o Current hyperlipidemia regimen: lovastatin 40 mg; last LDL well controlled at 56  Pharmacist Clinical Goal(s):  Marland Kitchen Over the next 90 days, patient with work with PharmD and primary care provider to address optimized medication management  Interventions: . Comprehensive medication review performed, medication list updated in electronic medical record . Reviewed risk of hypoglycemia w/ sulfonylureas. Encouraged to discuss d/c glipizide w/ Dr. Wynetta Emery at next appointment, especially since taking at night. If needed, can increase Trulitiy to 1.5 mg weekly to compensate . Discussed risk of hypotension, and risk of clonidine use in geriatric patients. Could be that with recent weight loss w/ Trulicity use that we need to reduce hypertensive regimen. Recommend d/c clonidine, and have patient continue to monitor BP regularly. Encouraged her to discuss w/ Dr. Wynetta Emery at upcoming  appointment.  Patient Self Care Activities:  . Patient will check blood glucose BID, document, and provide at future appointments . Patient will take medications as prescribed . Patient will contact provider with any episodes of hypoglycemia . Patient will report any questions or concerns to provider   Please see past updates related to this goal by clicking on the "Past Updates" button in the selected goal         Patient verbalizes understanding of instructions provided today.   Plan:  - Scheduled f/u call 02/02/20  [name], PharmD, [place] [phone]

## 2019-12-17 ENCOUNTER — Other Ambulatory Visit: Payer: Self-pay | Admitting: Nurse Practitioner

## 2019-12-17 DIAGNOSIS — J309 Allergic rhinitis, unspecified: Secondary | ICD-10-CM

## 2019-12-17 NOTE — Telephone Encounter (Signed)
Requested Prescriptions  Pending Prescriptions Disp Refills  . fluticasone (FLONASE) 50 MCG/ACT nasal spray [Pharmacy Med Name: Fluticasone Propionate 50 MCG/ACT Nasal Suspension] 48 g 0    Sig: Use 2 spray(s) in each nostril once daily     Ear, Nose, and Throat: Nasal Preparations - Corticosteroids Passed - 12/17/2019  8:11 AM      Passed - Valid encounter within last 12 months    Recent Outpatient Visits          3 months ago Type 2 diabetes mellitus with stage 3 chronic kidney disease, without long-term current use of insulin, unspecified whether stage 3a or 3b CKD ([place])   [place], Megan P, DO   5 months ago Encounter for annual physical exam   [place] Finger, Henrine Screws T, NP   7 months ago Essential hypertension   [place] Crissman, Jeannette How, MD   1 year ago Essential hypertension   [place], Jeannette How, MD   1 year ago Essential hypertension   [place], Jeannette How, MD      Future Appointments            In 3 days Johnson, Barb Merino, DO [employer]

## 2019-12-20 ENCOUNTER — Ambulatory Visit (INDEPENDENT_AMBULATORY_CARE_PROVIDER_SITE_OTHER): Payer: PPO | Admitting: Family Medicine

## 2019-12-20 ENCOUNTER — Ambulatory Visit: Payer: PPO | Admitting: Family Medicine

## 2019-12-20 ENCOUNTER — Encounter: Payer: Self-pay | Admitting: Family Medicine

## 2019-12-20 ENCOUNTER — Ambulatory Visit: Payer: Self-pay | Admitting: Pharmacist

## 2019-12-20 ENCOUNTER — Other Ambulatory Visit: Payer: Self-pay

## 2019-12-20 VITALS — BP 132/60 | HR 64 | Temp 97.9°F

## 2019-12-20 DIAGNOSIS — E1169 Type 2 diabetes mellitus with other specified complication: Secondary | ICD-10-CM | POA: Diagnosis not present

## 2019-12-20 DIAGNOSIS — E785 Hyperlipidemia, unspecified: Secondary | ICD-10-CM

## 2019-12-20 DIAGNOSIS — E782 Mixed hyperlipidemia: Secondary | ICD-10-CM | POA: Diagnosis not present

## 2019-12-20 DIAGNOSIS — I129 Hypertensive chronic kidney disease with stage 1 through stage 4 chronic kidney disease, or unspecified chronic kidney disease: Secondary | ICD-10-CM | POA: Diagnosis not present

## 2019-12-20 DIAGNOSIS — N183 Chronic kidney disease, stage 3 unspecified: Secondary | ICD-10-CM | POA: Diagnosis not present

## 2019-12-20 DIAGNOSIS — I10 Essential (primary) hypertension: Secondary | ICD-10-CM | POA: Diagnosis not present

## 2019-12-20 DIAGNOSIS — E1122 Type 2 diabetes mellitus with diabetic chronic kidney disease: Secondary | ICD-10-CM | POA: Diagnosis not present

## 2019-12-20 LAB — BAYER DCA HB A1C WAIVED: HB A1C (BAYER DCA - WAIVED): 6.8 % (ref <7.0)

## 2019-12-20 MED ORDER — TRULICITY 1.5 MG/0.5ML ~~LOC~~ SOAJ: 1.5000 mg | SUBCUTANEOUS | 6 refills | Status: DC

## 2019-12-20 NOTE — Progress Notes (Signed)
BP 132/60 (BP Location: Left Arm, Cuff Size: Normal)   Pulse 64   Temp 97.9 F (36.6 C)   SpO2 99%    Subjective:    Patient ID: [name], female    DOB: 03-30-1941, 79 y.o.   MRN: [id number]  HPI: NIDHI MECCIA is a 79 y.o. female  Chief Complaint  Patient presents with  . Diabetes  . Hyperlipidemia  . Hypertension   HYPERTENSION / [place] Satisfied with current treatment? yes Duration of hypertension: chronic BP monitoring frequency: weekly BP medication side effects: no Duration of hyperlipidemia: chronic Cholesterol medication side effects: no Cholesterol supplements: none Past cholesterol medications: lovastatin Medication compliance: excellent compliance Aspirin: no Recent stressors: no Recurrent headaches: no Visual changes: no Palpitations: no Dyspnea: no Chest pain: no Lower extremity edema: no Dizzy/lightheaded: yes- occasionally, rarely, in the AM  DIABETES Hypoglycemic episodes:no Polydipsia/polyuria: no Visual disturbance: no Chest pain: no Paresthesias: no Glucose Monitoring: yes  Accucheck frequency: occasionally  Fasting glucose: 170s Taking Insulin?: no Blood Pressure Monitoring: weekly Retinal Examination: Up to Date Foot Exam: Up to Date Diabetic Education: Completed Pneumovax: Up to Date Influenza: Up to Date Aspirin: no  Relevant past medical, surgical, family and social history reviewed and updated as indicated. Interim medical history since our last visit reviewed. Allergies and medications reviewed and updated.  Review of Systems  Constitutional: Negative.   Respiratory: Negative.   Cardiovascular: Negative.   Musculoskeletal: Negative.   Neurological: Negative.   Psychiatric/Behavioral: Negative.     Per HPI unless specifically indicated above     Objective:    BP 132/60 (BP Location: Left Arm, Cuff Size: Normal)   Pulse 64   Temp 97.9 F (36.6 C)   SpO2 99%   Wt Readings from Last 3 Encounters:    09/07/19 182 lb (82.6 kg)  08/06/19 182 lb (82.6 kg)  06/22/19 184 lb (83.5 kg)    Physical Exam Vitals and nursing note reviewed.  Constitutional:      General: She is not in acute distress.    Appearance: Normal appearance. She is not ill-appearing, toxic-appearing or diaphoretic.  HENT:     Head: Normocephalic and atraumatic.     Right Ear: External ear normal.     Left Ear: External ear normal.     Nose: Nose normal.     Mouth/Throat:     Mouth: Mucous membranes are moist.     Pharynx: Oropharynx is clear.  Eyes:     General: No scleral icterus.       Right eye: No discharge.        Left eye: No discharge.     Extraocular Movements: Extraocular movements intact.     Conjunctiva/sclera: Conjunctivae normal.     Pupils: Pupils are equal, round, and reactive to light.  Cardiovascular:     Rate and Rhythm: Normal rate and regular rhythm.     Pulses: Normal pulses.     Heart sounds: Normal heart sounds. No murmur. No friction rub. No gallop.   Pulmonary:     Effort: Pulmonary effort is normal. No respiratory distress.     Breath sounds: Normal breath sounds. No stridor. No wheezing, rhonchi or rales.  Chest:     Chest wall: No tenderness.  Musculoskeletal:        General: Normal range of motion.     Cervical back: Normal range of motion and neck supple.  Skin:    General: Skin is warm and dry.  Capillary Refill: Capillary refill takes less than 2 seconds.     Coloration: Skin is not jaundiced or pale.     Findings: No bruising, erythema, lesion or rash.  Neurological:     General: No focal deficit present.     Mental Status: She is alert and oriented to person, place, and time. Mental status is at baseline.  Psychiatric:        Mood and Affect: Mood normal.        Behavior: Behavior normal.        Thought Content: Thought content normal.        Judgment: Judgment normal.     Results for orders placed or performed in visit on 09/16/19  Bayer DCA Hb A1c Waived   Result Value Ref Range   HB A1C (BAYER DCA - WAIVED) 6.6 <7.0 %      Assessment & Plan:   Problem List Items Addressed This Visit      Cardiovascular and Mediastinum   Hypertension   Benign essential hypertension - Primary   Relevant Orders   Comprehensive metabolic panel     Endocrine   Hyperlipidemia associated with type 2 diabetes mellitus (HCC)   Relevant Medications   Dulaglutide (TRULICITY) 1.5 0000000 SOPN   Other Relevant Orders   Comprehensive metabolic panel   Lipid Panel w/o Chol/HDL Ratio   Type 2 diabetes mellitus with stage 3 chronic kidney disease (HCC)    Stable with A1c of 6.8- will stop glipizide and increase trulicity to 1.5mg  and recheck 3 months.       Relevant Medications   Dulaglutide (TRULICITY) 1.5 0000000 SOPN   Other Relevant Orders   Bayer DCA Hb A1c Waived   Comprehensive metabolic panel     Genitourinary   Benign hypertensive renal disease    Under good control on current regimen. Continue current regimen. Continue to monitor. Call with any concerns. Refills up to date. Dizziness very rarely and only in the AM- increase fluids. Let us know if happening more often and we will consider cutting down on medicine.          Other   Hyperlipemia, mixed    Under good control on current regimen. Continue current regimen. Continue to monitor. Call with any concerns. Refills up to date. Labs drawn today.           Follow up plan: Return in about 3 months (around 03/21/2020).

## 2019-12-20 NOTE — Chronic Care Management (AMB) (Signed)
Chronic Care Management   Follow Up Note   12/20/2019 Name: REIKO VINJE MRN: [id number] DOB: 11/05/1940  Referred by: Valerie Roys, DO Reason for referral : Chronic Care Management (Medication Management)   MARABETH MELLAND is a 79 y.o. year old female who is a primary care patient of Valerie Roys, DO. The CCM team was consulted for assistance with chronic disease management and care coordination needs.    Care coordination completed today.   Review of patient status, including review of consultants reports, relevant laboratory and other test results, and collaboration with appropriate care team members and the patient's provider was performed as part of comprehensive patient evaluation and provision of chronic care management services.    SDOH (Social Determinants of Health) assessments performed: Yes See Care Plan activities for detailed interventions related to [facility])     Outpatient Encounter Medications as of 12/20/2019  Medication Sig Note  . amLODipine (NORVASC) 10 MG tablet Take 1 tablet (10 mg total) by mouth daily. 07/13/2019: QPM  . benazepril (LOTENSIN) 40 MG tablet Take 1 tablet (40 mg total) by mouth daily. 07/13/2019: QAM  . BIOTIN PO Take 1 capsule by mouth daily.   . brimonidine (ALPHAGAN) 0.2 % ophthalmic solution    . carvedilol (COREG) 25 MG tablet Take 1 tablet (25 mg total) by mouth 2 (two) times daily with a meal.   . cholecalciferol (VITAMIN D3) 25 MCG (1000 UNIT) tablet Take 1,000 Units by mouth daily.   . cloNIDine (CATAPRES) 0.1 MG tablet Take 1 tablet (0.1 mg total) by mouth 2 (two) times daily.   . dorzolamide-timolol (COSOPT) 22.3-6.8 MG/ML ophthalmic solution dorzolamide 22.3 mg-timolol 6.8 mg/mL eye drops   . Dulaglutide (TRULICITY) 4.82 NO/0.3BC SOPN Inject 0.75 mg into the skin once a week.   . Dulaglutide (TRULICITY) 1.5 WU/8.8BV SOPN Inject 1.5 mg into the skin once a week.   . fexofenadine (ALLEGRA) 180 MG tablet Take 180 mg by mouth daily.   .  fluticasone (FLONASE) 50 MCG/ACT nasal spray Use 2 spray(s) in each nostril once daily   . furosemide (LASIX) 20 MG tablet Take 20 mg by mouth daily.   Marland Kitchen glucose blood (ONETOUCH ULTRA) test strip 1 each by Other route as needed for other. Use as instructed   . latanoprost (XALATAN) 0.005 % ophthalmic solution    . levothyroxine (SYNTHROID) 150 MCG tablet Take 1 tablet (150 mcg total) by mouth daily before breakfast.   . lovastatin (MEVACOR) 40 MG tablet Take 1 tablet (40 mg total) by mouth at bedtime.   [name] DELICA LANCETS 69I MISC USE AS DIRECTED ONCE DAILY WITH TEST STRIPS E11.22    No facility-administered encounter medications on file as of 12/20/2019.     Objective:   Goals Addressed            This Visit's Progress     Patient Stated   . "I want to improve my diabetes" (pt-stated)       CARE PLAN ENTRY (see longtitudinal plan of care for additional care plan information)  Current Barriers:  . Diabetes: uncontrolled; most recent A1c 6.8%. . Current antihyperglycemic regimen: Trulicity 5.03 mg- Increased to Trulicity 1.5 mg weekly today, glipizide d/c o APPROVED for Trulicity assistance through [place] through 10/13/20 . Most recent eGFR: ~35 mL/min (per nephrology Dr. Juleen China) . Current blood glucose readings:  o Fasting: 150-170s  o Not checking 2 hour post prandial . Denies any episodes of hypoglycemia . Cardiovascular risk reduction (follows w/  Dr. [facility]) o Current hypertensive regimen: carvedilol 25 mg BID, clonidine 0.1 mg BID, benazepril 40 mg QAM, amlodipine 10 mg daily, furosemide 20 mg daily; BP: reports BP 116/50; BP good in office today so deferring any medication adjustments o Current hyperlipidemia regimen: lovastatin 40 mg; last LDL well controlled at 56  Pharmacist Clinical Goal(s):  Marland Kitchen Over the next 90 days, patient with work with PharmD and primary care provider to address optimized medication management  Interventions: . Contacted  Dealer, left verbal for Trulicity 1.5 mg weekly, 4 month supply w/ 2 refills under Dr. Wynetta Emery.   Patient Self Care Activities:  . Patient will check blood glucose BID, document, and provide at future appointments . Patient will take medications as prescribed . Patient will contact provider with any episodes of hypoglycemia . Patient will report any questions or concerns to provider   Please see past updates related to this goal by clicking on the "Past Updates" button in the selected goal          Plan:  - Will outreach patient as previously scheduled  [name], PharmD, [place] [phone]

## 2019-12-20 NOTE — Assessment & Plan Note (Signed)
Under good control on current regimen. Continue current regimen. Continue to monitor. Call with any concerns. Refills up to date. Dizziness very rarely and only in the AM- increase fluids. Let us know if happening more often and we will consider cutting down on medicine.

## 2019-12-20 NOTE — Assessment & Plan Note (Signed)
Stable with A1c of 6.8- will stop glipizide and increase trulicity to 1.5mg  and recheck 3 months.

## 2019-12-20 NOTE — Assessment & Plan Note (Signed)
Under good control on current regimen. Continue current regimen. Continue to monitor. Call with any concerns. Refills up to date. Labs drawn today.  

## 2019-12-20 NOTE — Patient Instructions (Signed)
Visit Information  Goals Addressed            This Visit's Progress     Patient Stated   . "I want to improve my diabetes" (pt-stated)       CARE PLAN ENTRY (see longtitudinal plan of care for additional care plan information)  Current Barriers:  . Diabetes: uncontrolled; most recent A1c 6.8%. . Current antihyperglycemic regimen: Trulicity 0.75 mg- Increased to Trulicity 1.5 mg weekly today, glipizide d/c o APPROVED for Trulicity assistance through [employer] through 10/13/20 . Most recent eGFR: ~35 mL/min (per nephrology Dr. Kolluru) . Current blood glucose readings:  o Fasting: 150-170s  o Not checking 2 hour post prandial . Denies any episodes of hypoglycemia . Cardiovascular risk reduction (follows w/ Dr. [facility]) o Current hypertensive regimen: carvedilol 25 mg BID, clonidine 0.1 mg BID, benazepril 40 mg QAM, amlodipine 10 mg daily, furosemide 20 mg daily; BP: reports BP 116/50; BP good in office today so deferring any medication adjustments o Current hyperlipidemia regimen: lovastatin 40 mg; last LDL well controlled at 56  Pharmacist Clinical Goal(s):  . Over the next 90 days, patient with work with PharmD and primary care provider to address optimized medication management  Interventions: . Contacted [facility] pharmacist voicemail, left verbal for Trulicity 1.5 mg weekly, 4 month supply w/ 2 refills under Dr. Johnson.   Patient Self Care Activities:  . Patient will check blood glucose BID, document, and provide at future appointments . Patient will take medications as prescribed . Patient will contact provider with any episodes of hypoglycemia . Patient will report any questions or concerns to provider   Please see past updates related to this goal by clicking on the "Past Updates" button in the selected goal         Patient verbalizes understanding of instructions provided today.   Plan:  - Will outreach patient as previously scheduled  Catie  Travis, PharmD, BCACP Clinical Pharmacist [facility] [phone]  

## 2019-12-21 ENCOUNTER — Encounter: Payer: Self-pay | Admitting: Family Medicine

## 2019-12-21 LAB — COMPREHENSIVE METABOLIC PANEL
ALT: 34 IU/L — ABNORMAL HIGH (ref 0–32)
AST: 28 IU/L (ref 0–40)
Albumin/Globulin Ratio: 1.6 (ref 1.2–2.2)
Albumin: 4.1 g/dL (ref 3.7–4.7)
Alkaline Phosphatase: 53 IU/L (ref 39–117)
BUN/Creatinine Ratio: 15 (ref 12–28)
BUN: 19 mg/dL (ref 8–27)
Bilirubin Total: 0.6 mg/dL (ref 0.0–1.2)
CO2: 20 mmol/L (ref 20–29)
Calcium: 8.9 mg/dL (ref 8.7–10.3)
Chloride: 106 mmol/L (ref 96–106)
Creatinine, Ser: 1.31 mg/dL — ABNORMAL HIGH (ref 0.57–1.00)
GFR calc Af Amer: 44 mL/min/{1.73_m2} — ABNORMAL LOW (ref >59)
GFR calc non Af Amer: 38 mL/min/{1.73_m2} — ABNORMAL LOW (ref >59)
Globulin, Total: 2.5 g/dL (ref 1.5–4.5)
Glucose: 208 mg/dL — ABNORMAL HIGH (ref 65–99)
Potassium: 4.1 mmol/L (ref 3.5–5.2)
Sodium: 141 mmol/L (ref 134–144)
Total Protein: 6.6 g/dL (ref 6.0–8.5)

## 2019-12-21 LAB — LIPID PANEL W/O CHOL/HDL RATIO
Cholesterol, Total: 142 mg/dL (ref 100–199)
HDL: 47 mg/dL (ref >39)
LDL Chol Calc (NIH): 62 mg/dL (ref 0–99)
Triglycerides: 204 mg/dL — ABNORMAL HIGH (ref 0–149)
VLDL Cholesterol Cal: 33 mg/dL (ref 5–40)

## 2019-12-28 DIAGNOSIS — H401132 Primary open-angle glaucoma, bilateral, moderate stage: Secondary | ICD-10-CM | POA: Diagnosis not present

## 2020-01-26 ENCOUNTER — Telehealth: Payer: Self-pay

## 2020-01-26 DIAGNOSIS — D631 Anemia in chronic kidney disease: Secondary | ICD-10-CM | POA: Diagnosis not present

## 2020-01-26 DIAGNOSIS — E876 Hypokalemia: Secondary | ICD-10-CM | POA: Diagnosis not present

## 2020-01-26 DIAGNOSIS — I129 Hypertensive chronic kidney disease with stage 1 through stage 4 chronic kidney disease, or unspecified chronic kidney disease: Secondary | ICD-10-CM | POA: Diagnosis not present

## 2020-01-26 DIAGNOSIS — N2889 Other specified disorders of kidney and ureter: Secondary | ICD-10-CM | POA: Diagnosis not present

## 2020-01-26 DIAGNOSIS — N1832 Chronic kidney disease, stage 3b: Secondary | ICD-10-CM | POA: Diagnosis not present

## 2020-01-26 DIAGNOSIS — N2581 Secondary hyperparathyroidism of renal origin: Secondary | ICD-10-CM | POA: Diagnosis not present

## 2020-01-26 DIAGNOSIS — R809 Proteinuria, unspecified: Secondary | ICD-10-CM | POA: Diagnosis not present

## 2020-01-26 DIAGNOSIS — E1122 Type 2 diabetes mellitus with diabetic chronic kidney disease: Secondary | ICD-10-CM | POA: Diagnosis not present

## 2020-02-01 ENCOUNTER — Other Ambulatory Visit: Payer: Self-pay | Admitting: Nephrology

## 2020-02-01 DIAGNOSIS — E1122 Type 2 diabetes mellitus with diabetic chronic kidney disease: Secondary | ICD-10-CM

## 2020-02-01 DIAGNOSIS — R809 Proteinuria, unspecified: Secondary | ICD-10-CM

## 2020-02-01 DIAGNOSIS — N1832 Chronic kidney disease, stage 3b: Secondary | ICD-10-CM

## 2020-02-01 DIAGNOSIS — N2581 Secondary hyperparathyroidism of renal origin: Secondary | ICD-10-CM

## 2020-02-01 DIAGNOSIS — N2889 Other specified disorders of kidney and ureter: Secondary | ICD-10-CM

## 2020-02-01 DIAGNOSIS — E876 Hypokalemia: Secondary | ICD-10-CM

## 2020-02-01 DIAGNOSIS — I129 Hypertensive chronic kidney disease with stage 1 through stage 4 chronic kidney disease, or unspecified chronic kidney disease: Secondary | ICD-10-CM

## 2020-02-02 ENCOUNTER — Ambulatory Visit (INDEPENDENT_AMBULATORY_CARE_PROVIDER_SITE_OTHER): Payer: PPO | Admitting: Pharmacist

## 2020-02-02 DIAGNOSIS — E1122 Type 2 diabetes mellitus with diabetic chronic kidney disease: Secondary | ICD-10-CM

## 2020-02-02 DIAGNOSIS — I129 Hypertensive chronic kidney disease with stage 1 through stage 4 chronic kidney disease, or unspecified chronic kidney disease: Secondary | ICD-10-CM | POA: Diagnosis not present

## 2020-02-02 DIAGNOSIS — N183 Chronic kidney disease, stage 3 unspecified: Secondary | ICD-10-CM | POA: Diagnosis not present

## 2020-02-02 NOTE — Patient Instructions (Signed)
Visit Information  Goals Addressed            This Visit's Progress     Patient Stated   . PharmD "I want to improve my diabetes" (pt-stated)       CARE PLAN ENTRY (see longtitudinal plan of care for additional care plan information)  Current Barriers:  . Diabetes: uncontrolled; most recent A1c 6.8%. . Current antihyperglycemic regimen: Trulicity 1.5 mg weekly (recently increased from 0.75mg daily),  o Glipizide was STOPPED at last visit however patient reports still taking as needed for high blood glucose readings. It took about 2 weeks for Trulicity 0.75 mg dose to come in the mail o APPROVED for Trulicity assistance through [employer] through 10/13/20 . Most recent eGFR: ~35 mL/min (per nephrology Dr. Kolluru) . Current blood glucose readings:  o Fasting: 150-170s (note: patient reports her glucometer is 4-5 years old and wonders if her readings are accurate) o Not checking 2 hour post prandial . Denies any episodes of hypoglycemia . Cardiovascular risk reduction (follows w/ [facility]) o Current hypertensive regimen: carvedilol 25 mg BID, clonidine 0.1 mg BID, benazepril 40 mg QAM, amlodipine 10 mg daily, furosemide 20 mg daily; BP: reports BP 133/65 though is using a wrist cuff o Patient previously reported symptoms of hypotension (systolic in the 50's at night) but reports this has not occurred for months. Also reports a "feeling in my back and base of my neck" that comes on with these episodes.  o Current hyperlipidemia regimen: lovastatin 40 mg; last LDL well controlled at 56  Pharmacist Clinical Goal(s):  . Over the next 90 days, patient with work with PharmD and primary care provider to address optimized medication management  Interventions: . Comprehensive medication review performed, medication list updated in electronic medical record . Inter-disciplinary care team collaboration (see longitudinal plan of care) . Strongly advised patient to STOP taking glipizide as needed  for high blood glucose as we are likely to see her BG decrease with higher dose of Trulicity . Recommended patient get a new glucometer as hers is 4-5 years old and her fastings are not correlating with her A1c. Will collaborate with with PCP Dr. Johnson to send new Rx . Recommended patient taper off of clonidine. Patient had previously discussed with nephrology and will be seeing cardiology next month to discuss if risk of side effects outweigh benefit at this time. Encouraged her to switch to an arm cuff that she has at home, and to continue to record home readings to take to cardiology f/u.   Patient Self Care Activities:  . Patient will check blood glucose BID, document, and provide at future appointments . Patient will take medications as prescribed . Patient will contact provider with any episodes of hypoglycemia . Patient will report any questions or concerns to provider   Please see past updates related to this goal by clicking on the "Past Updates" button in the selected goal         Patient verbalizes understanding of instructions provided today.    Plan:  - Scheduled f/u call 03/29/20  Catie Travis, PharmD, BCACP Clinical Pharmacist [facility] [phone] 

## 2020-02-02 NOTE — Chronic Care Management (AMB) (Signed)
Chronic Care Management   Follow Up Note   02/02/2020 Name: Julia Nielsen MRN: [id number] DOB: 01-19-1941  Referred by: Valerie Roys, DO Reason for referral : Chronic Care Management (Medication Management)   Julia Nielsen is a 79 y.o. year old female who is a primary care patient of Valerie Roys, DO. The CCM team was consulted for assistance with chronic disease management and care coordination needs.    Contacted patient for medication management follow up.   Review of patient status, including review of consultants reports, relevant laboratory and other test results, and collaboration with appropriate care team members and the patient's provider was performed as part of comprehensive patient evaluation and provision of chronic care management services.    SDOH (Social Determinants of Health) assessments performed: No See Care Plan activities for detailed interventions related to [facility])     Outpatient Encounter Medications as of 02/02/2020  Medication Sig Note  . amLODipine (NORVASC) 10 MG tablet Take 1 tablet (10 mg total) by mouth daily. 07/13/2019: QPM  . benazepril (LOTENSIN) 40 MG tablet Take 1 tablet (40 mg total) by mouth daily. 07/13/2019: QAM  . BIOTIN PO Take 1 capsule by mouth daily.   . brimonidine (ALPHAGAN) 0.2 % ophthalmic solution Place 1 drop into both eyes in the morning and at bedtime.    . carvedilol (COREG) 25 MG tablet Take 1 tablet (25 mg total) by mouth 2 (two) times daily with a meal.   . cholecalciferol (VITAMIN D3) 25 MCG (1000 UNIT) tablet Take 1,000 Units by mouth daily.   . cloNIDine (CATAPRES) 0.1 MG tablet Take 1 tablet (0.1 mg total) by mouth 2 (two) times daily.   . dorzolamide-timolol (COSOPT) 22.3-6.8 MG/ML ophthalmic solution Place 1 drop into both eyes 2 (two) times daily.    . Dulaglutide (TRULICITY) 1.5 JK/0.9FG SOPN Inject 1.5 mg into the skin once a week.   . fexofenadine (ALLEGRA) 180 MG tablet Take 180 mg by mouth daily.   .  fluticasone (FLONASE) 50 MCG/ACT nasal spray Use 2 spray(s) in each nostril once daily   . furosemide (LASIX) 20 MG tablet Take 20 mg by mouth daily.   Marland Kitchen glipiZIDE (GLUCOTROL) 5 MG tablet Take 5 mg by mouth 2 (two) times daily. 02/02/2020: This medication was STOPPED, however patient reports still taking AS NEEDED for high blood sugar  . latanoprost (XALATAN) 0.005 % ophthalmic solution Place 1 drop into both eyes at bedtime.    Marland Kitchen levothyroxine (SYNTHROID) 150 MCG tablet Take 1 tablet (150 mcg total) by mouth daily before breakfast.   . lovastatin (MEVACOR) 40 MG tablet Take 1 tablet (40 mg total) by mouth at bedtime.   Marland Kitchen glucose blood (ONETOUCH ULTRA) test strip 1 each by Other route as needed for other. Use as instructed   . ONETOUCH DELICA LANCETS 18E MISC USE AS DIRECTED ONCE DAILY WITH TEST STRIPS E11.22   . [DISCONTINUED] Dulaglutide (TRULICITY) 9.93 ZJ/6.9CV SOPN Inject 0.75 mg into the skin once a week. (Patient not taking: Reported on 02/02/2020)    No facility-administered encounter medications on file as of 02/02/2020.     Objective:   Goals Addressed            This Visit's Progress     Patient Stated   . PharmD "I want to improve my diabetes" (pt-stated)       CARE PLAN ENTRY (see longtitudinal plan of care for additional care plan information)  Current Barriers:  . Diabetes: uncontrolled; most  recent A1c 6.8%. . Current antihyperglycemic regimen: Trulicity 1.5 mg weekly (recently increased from 0.92m daily),  o Glipizide was STOPPED at last visit however patient reports still taking as needed for high blood glucose readings. It took about 2 weeks for Trulicity 05.36mg dose to come in the mail o [place] . Most recent eGFR: ~35 mL/min (per nephrology Dr. KJuleen China . Current blood glucose readings:  o Fasting: 150-170s (note: patient reports her glucometer is 432570years old and wonders if her readings are  accurate) o Not checking 2 hour post prandial . Denies any episodes of hypoglycemia . [facility] o Current hypertensive regimen: carvedilol 25 mg BID, clonidine 0.1 mg BID, benazepril 40 mg QAM, amlodipine 10 mg daily, furosemide 20 mg daily; BP: reports BP 133/65 though is using a wrist cuff o Patient previously reported symptoms of hypotension (systolic in the 564'Qat night) but reports this has not occurred for months. Also reports a "feeling in my back and base of my neck" that comes on with these episodes.  o Current hyperlipidemia regimen: lovastatin 40 mg; last LDL well controlled at 56  Pharmacist Clinical Goal(s):  .Marland KitchenOver the next 90 days, patient with work with PharmD and primary care provider to address optimized medication management  Interventions: . Comprehensive medication review performed, medication list updated in electronic medical record . Inter-disciplinary care team collaboration (see longitudinal plan of care) . Strongly advised patient to STOP taking glipizide as needed for high blood glucose as we are likely to see her BG decrease with higher dose of Trulicity . Recommended patient get a new glucometer as hers is 452558years old and her fastings are not correlating with her A1c. Will collaborate with with PCP Dr. JWynetta Emeryto send new Rx . Recommended patient taper off of clonidine. Patient had previously discussed with nephrology and will be seeing cardiology next month to discuss if risk of side effects outweigh benefit at this time. Encouraged her to switch to an arm cuff that she has at home, and to continue to record home readings to take to cardiology f/u.   Patient Self Care Activities:  . Patient will check blood glucose BID, document, and provide at future appointments . Patient will take medications as prescribed . Patient will contact provider with any episodes of hypoglycemia . Patient will report any questions or  concerns to provider   Please see past updates related to this goal by clicking on the "Past Updates" button in the selected goal          Plan:  - Scheduled f/u call 03/29/20  Catie TDarnelle Maffucci PharmD, [phone]

## 2020-02-09 ENCOUNTER — Other Ambulatory Visit: Payer: Self-pay

## 2020-02-09 ENCOUNTER — Ambulatory Visit
Admission: RE | Admit: 2020-02-09 | Discharge: 2020-02-09 | Disposition: A | Discharge status: Home / Self Care | Payer: PPO | Source: Ambulatory Visit | Attending: Nephrology | Admitting: Nephrology

## 2020-02-09 DIAGNOSIS — E1121 Type 2 diabetes mellitus with diabetic nephropathy: Secondary | ICD-10-CM | POA: Diagnosis not present

## 2020-02-09 DIAGNOSIS — N2581 Secondary hyperparathyroidism of renal origin: Secondary | ICD-10-CM | POA: Insufficient documentation

## 2020-02-09 DIAGNOSIS — E1122 Type 2 diabetes mellitus with diabetic chronic kidney disease: Secondary | ICD-10-CM

## 2020-02-09 DIAGNOSIS — N2889 Other specified disorders of kidney and ureter: Secondary | ICD-10-CM | POA: Insufficient documentation

## 2020-02-09 DIAGNOSIS — N1832 Chronic kidney disease, stage 3b: Secondary | ICD-10-CM | POA: Diagnosis not present

## 2020-02-09 DIAGNOSIS — R809 Proteinuria, unspecified: Secondary | ICD-10-CM | POA: Insufficient documentation

## 2020-02-09 DIAGNOSIS — E876 Hypokalemia: Secondary | ICD-10-CM | POA: Diagnosis not present

## 2020-02-09 DIAGNOSIS — I129 Hypertensive chronic kidney disease with stage 1 through stage 4 chronic kidney disease, or unspecified chronic kidney disease: Secondary | ICD-10-CM | POA: Insufficient documentation

## 2020-02-09 DIAGNOSIS — N183 Chronic kidney disease, stage 3 unspecified: Secondary | ICD-10-CM | POA: Diagnosis not present

## 2020-02-22 DIAGNOSIS — R600 Localized edema: Secondary | ICD-10-CM | POA: Diagnosis not present

## 2020-02-22 DIAGNOSIS — N1832 Chronic kidney disease, stage 3b: Secondary | ICD-10-CM | POA: Diagnosis not present

## 2020-02-22 DIAGNOSIS — I10 Essential (primary) hypertension: Secondary | ICD-10-CM | POA: Diagnosis not present

## 2020-02-22 DIAGNOSIS — I2510 Atherosclerotic heart disease of native coronary artery without angina pectoris: Secondary | ICD-10-CM | POA: Diagnosis not present

## 2020-02-22 DIAGNOSIS — E782 Mixed hyperlipidemia: Secondary | ICD-10-CM | POA: Diagnosis not present

## 2020-03-08 IMAGING — CT CT ABDOMEN WO/W CM
3 of 12 series · 10 of 46 positions shown, 16 images · IV contrast (omnipaque)
Comparison: Renal sonogram dated 07/01/2019

CLINICAL DATA: Question of solid renal mass discovered on
ultrasound. History of kidney stones and left breast cancer

EXAM:
CT ABDOMEN WITHOUT AND WITH CONTRAST
TECHNIQUE: Multidetector CT imaging of the abdomen was performed following the
standard protocol before and following the bolus administration of
intravenous contrast.
CONTRAST:  75mL OMNIPAQUE IOHEXOL 300 MG/ML  SOLN

[Series 4: coronal without renal without 2.00 cor · coronal · non-contrast · 0.53mm/px · 2 of 148 slices shown, 3 images]
[im 50/148  soft-tissue]
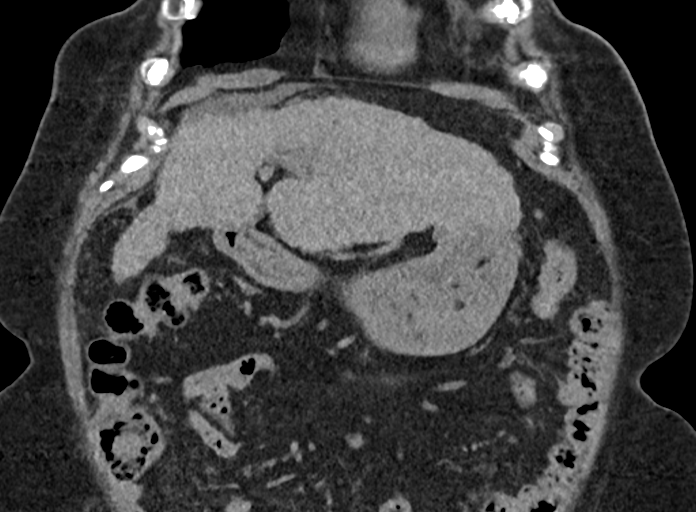
[im 50/148  bone]
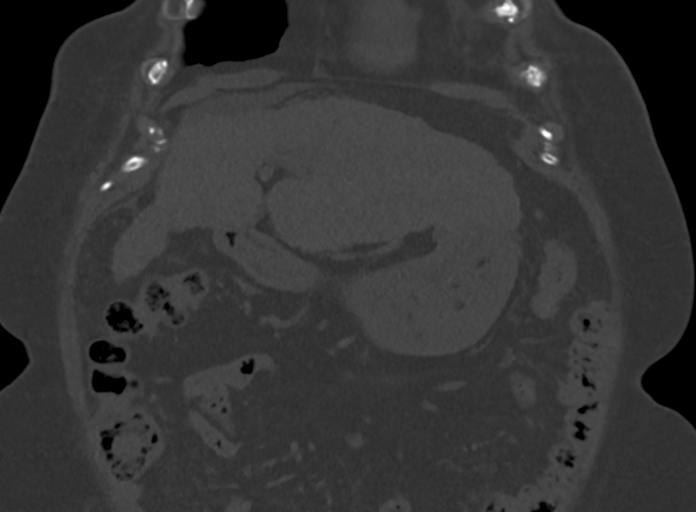
[im 99/148  soft-tissue]
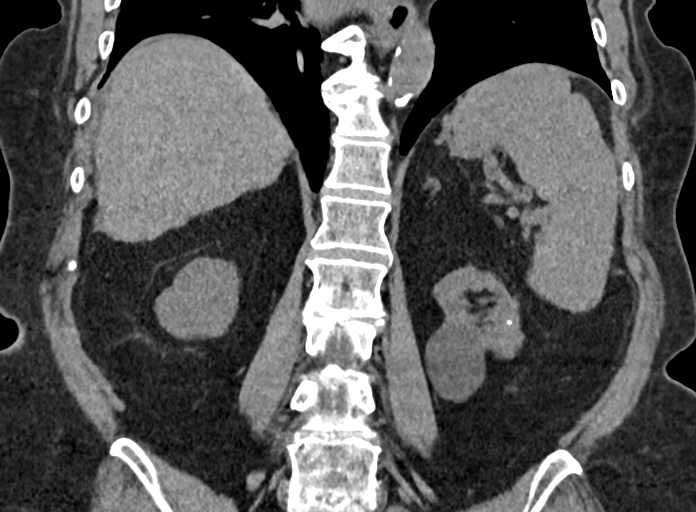

[Series 8: axial arterial renal arterial 2.00 · axial · arterial · 0.71mm/px · z∈[-1274,-1098]mm · 5 of 134 slices shown, 10 images]
[im 23/134  soft-tissue]
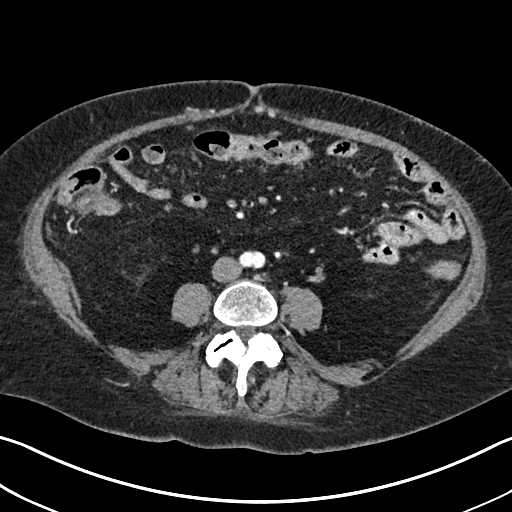
[im 23/134  bone]
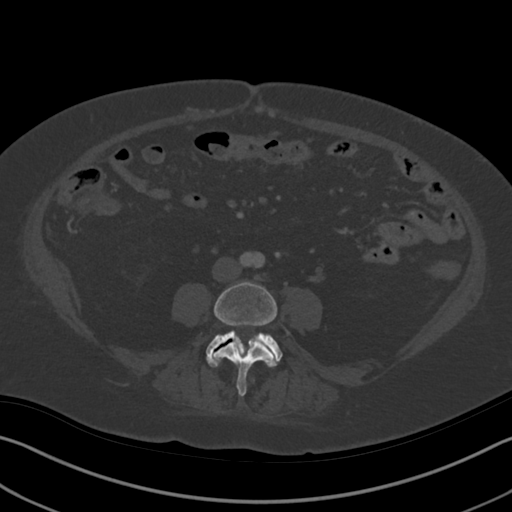
[im 45/134  soft-tissue]
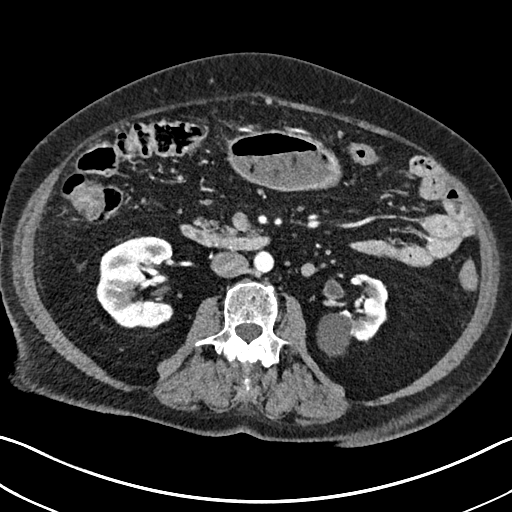
[im 45/134  lung]
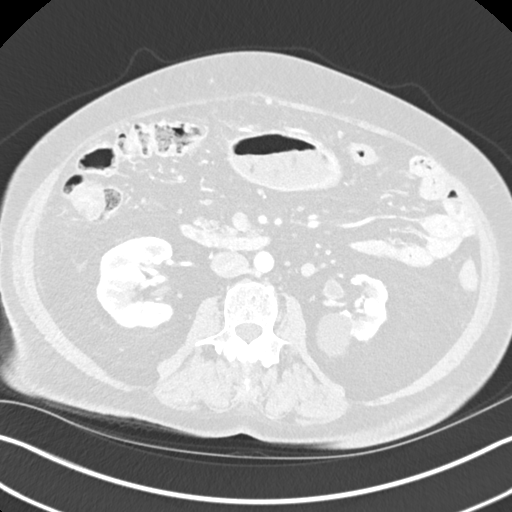
[im 67/134  soft-tissue]
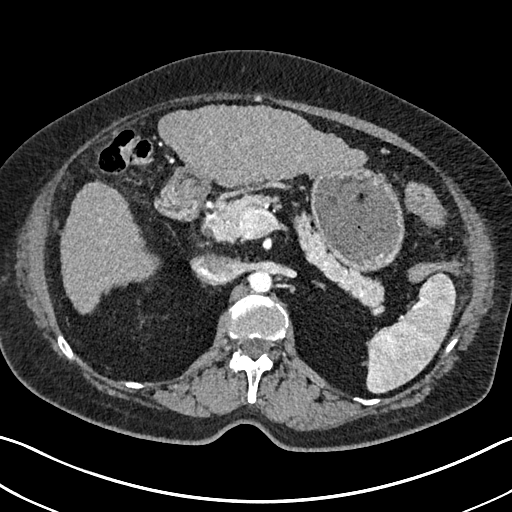
[im 67/134  lung]
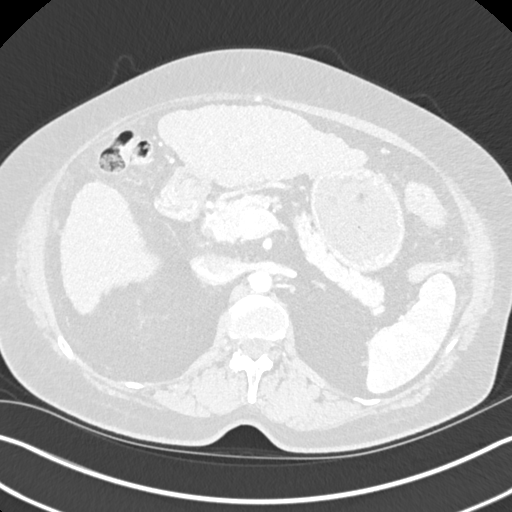
[im 89/134  soft-tissue]
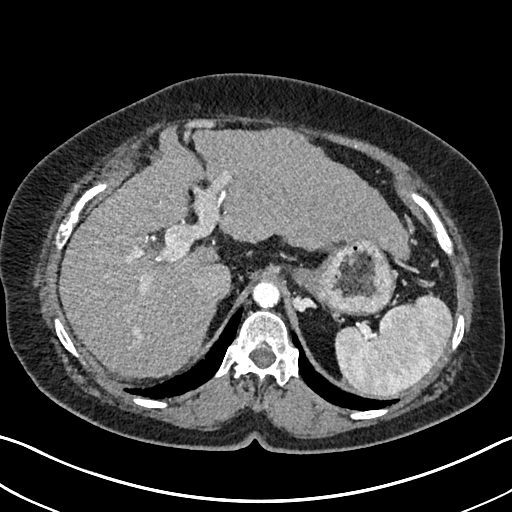
[im 89/134  lung]
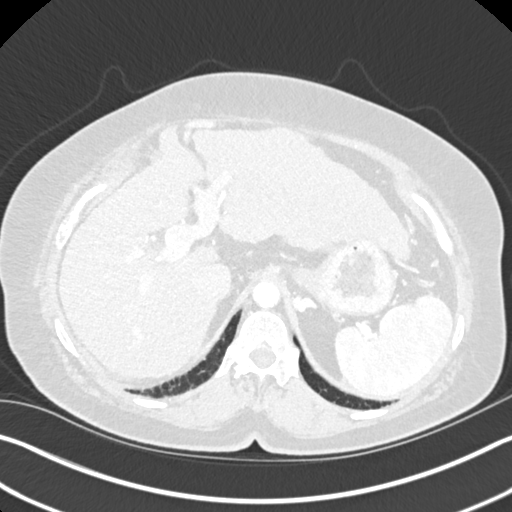
[im 111/134  soft-tissue]
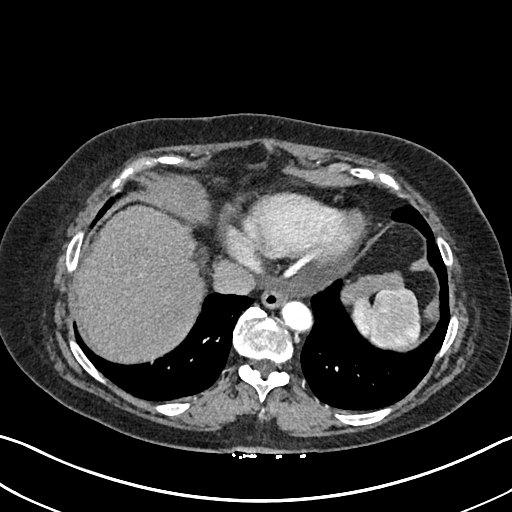
[im 111/134  lung]
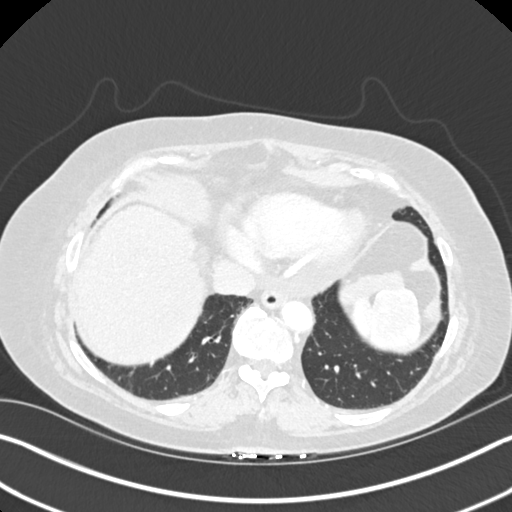

[Series 15: axial nephrographic renal nephrographic 2.00 · axial · 0.71mm/px · z∈[-1274,-1186]mm · 3 of 134 slices shown]
[im 23/134  soft-tissue]
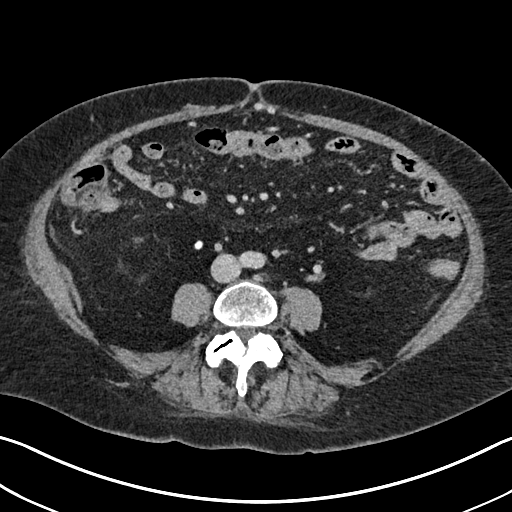
[im 45/134  soft-tissue]
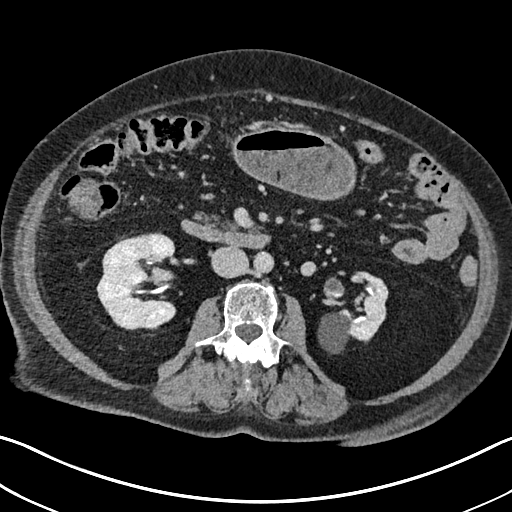
[im 67/134  soft-tissue]
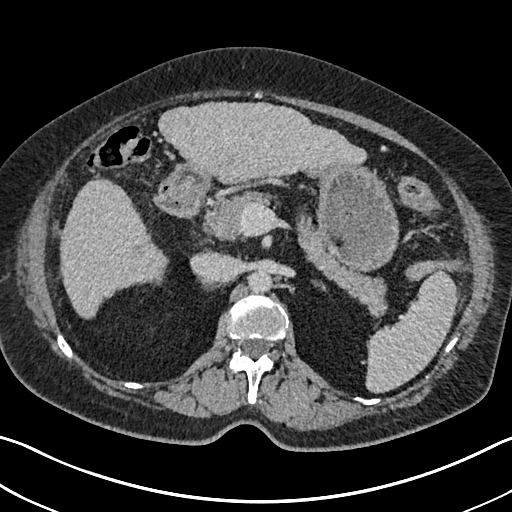

[10 of 46 positions shown; findings below may reference images not displayed]

FINDINGS: Lower chest: Basilar atelectasis, no signs of consolidation or
evidence of pleural effusion.

Hepatobiliary: Marked hepatic nodularity with signs of portal
hypertension including recanalized umbilical vein. No signs of
focal, suspicious hepatic lesion. Portal vein is patent.

Hepatic arterial supply arising from the celiac axis and classic
fashion. No signs of splenic artery aneurysm. Spleen is mildly
enlarged at 13 cm greatest craniocaudal dimension.

Post cholecystectomy without signs of biliary ductal dilation.

Pancreas: Unremarkable. No pancreatic ductal dilatation or
surrounding inflammatory changes.

Spleen: Mild splenomegaly 13 cm greatest craniocaudal dimension.
Perisplenic varices with small splenorenal shunt extending along the
posterior margin the stomach.

Adrenals/Urinary Tract: Adrenal glands are normal.

Marked left renal cortical scarring. Large cyst arising from
posterior left kidney measures 3.6 by 3.6 cm. Smaller low-density
foci in the upper pole are also likely small cysts, the lateral of
the 2, too small to characterize. The largest in the upper pole
measures approximately 1 cm.

Small cyst in the upper pole the right kidney. Interpolar right
kidney with a crisply marginated low-density lesion measuring 1.6 x
1.5 cm. Post-contrast density 22 Hounsfield units, precontrast
density 14 Hounsfield units. Suggestion of very thin, less than 2 mm
internal septations. Additional small low-density foci in the lower
pole the right kidney also too small for definitive characterization
but likely cysts as well.

No signs of hydronephrosis with patulous left ureter showing diffuse
nonnodular urothelial enhancement

Stomach/Bowel: No acute findings related to the gastrointestinal
tract. Pelvic bowel loops are not imaged.

Vascular/Lymphatic: Vascular structures in the abdomen are patent
with scattered atherosclerotic changes. Specifically portal vein and
SMV are patent in the setting of portal hypertension. No signs of
adenopathy in the retroperitoneum or in the upper abdomen.

Other: Hazy changes in the mesentery may relate to low-level edema
in the setting of portal hypertension. Trace ascites about the liver
and along the left pericolic gutter.

Musculoskeletal: Spinal degenerative changes without acute or
destructive bone process.
IMPRESSION: 1. There is a 1.6 x 1.5 cm crisply marginated low-density lesion in
the upper pole of the right kidney with a thin, less than 2 mm
internal septations. Findings favor hemorrhagic or proteinaceous
cyst benign appearance.
2. Marked left renal cortical scarring.
3. Cirrhosis with signs of portal hypertension as described. Note
that the liver is markedly nodular there is frank recanalization of
the umbilical vein with mild splenomegaly.
4. Hazy changes in the mesentery may relate to low-level edema in
the setting of portal hypertension.
5. Atherosclerosis.
6. These results will be called to the ordering clinician or
representative by the Radiologist Assistant, and communication
documented in the PACS or zVision Dashboard.

Aortic Atherosclerosis ([id number]-[id number].J).

## 2020-03-09 ENCOUNTER — Other Ambulatory Visit: Payer: Self-pay | Admitting: Nurse Practitioner

## 2020-03-09 DIAGNOSIS — J309 Allergic rhinitis, unspecified: Secondary | ICD-10-CM

## 2020-03-09 NOTE — Telephone Encounter (Signed)
Requested Prescriptions  Pending Prescriptions Disp Refills  . fluticasone (FLONASE) 50 MCG/ACT nasal spray [Pharmacy Med Name: Fluticasone Propionate 50 MCG/ACT Nasal Suspension] 48 g 0    Sig: Use 2 spray(s) in each nostril once daily     Ear, Nose, and Throat: Nasal Preparations - Corticosteroids Passed - 03/09/2020  7:43 AM      Passed - Valid encounter within last 12 months    Recent Outpatient Visits          2 months ago Benign essential hypertension   [facility] [place], Megan P, DO   5 months ago Type 2 diabetes mellitus with stage 3 chronic kidney disease, without long-term current use of insulin, unspecified whether stage 3a or 3b CKD ([place])   [place], Megan P, DO   8 months ago Encounter for annual physical exam   [employer], Barbaraann Faster, NP   10 months ago Essential hypertension   [place], Jeannette How, MD   1 year ago Essential hypertension   [place], Jeannette How, MD      Future Appointments            In 3 weeks Johnson, Barb Merino, DO [place], [facility]

## 2020-03-29 ENCOUNTER — Ambulatory Visit (INDEPENDENT_AMBULATORY_CARE_PROVIDER_SITE_OTHER): Payer: PPO | Admitting: Pharmacist

## 2020-03-29 DIAGNOSIS — N183 Chronic kidney disease, stage 3 unspecified: Secondary | ICD-10-CM

## 2020-03-29 DIAGNOSIS — I10 Essential (primary) hypertension: Secondary | ICD-10-CM | POA: Diagnosis not present

## 2020-03-29 DIAGNOSIS — E1122 Type 2 diabetes mellitus with diabetic chronic kidney disease: Secondary | ICD-10-CM | POA: Diagnosis not present

## 2020-03-29 NOTE — Chronic Care Management (AMB) (Signed)
Chronic Care Management   Follow Up Note   03/29/2020 Name: Julia Nielsen MRN: [id number] DOB: 28-Jul-1941  Referred by: Dorcas Carrow, DO Reason for referral : Chronic Care Management (Medication Management)   Julia Nielsen is a 79 y.o. year old female who is a primary care patient of Dorcas Carrow, DO. The CCM team was consulted for assistance with chronic disease management and care coordination needs.    Contacted patient for medication management review.   Review of patient status, including review of consultants reports, relevant laboratory and other test results, and collaboration with appropriate care team members and the patient's provider was performed as part of comprehensive patient evaluation and provision of chronic care management services.    SDOH (Social Determinants of Health) assessments performed: Yes See Care Plan activities for detailed interventions related to SDOH)  SDOH Interventions     Most Recent Value  SDOH Interventions  Financial Strain Interventions Other (Comment)  [patient assistance program]  Stress Interventions Provide Counseling       Outpatient Encounter Medications as of 03/29/2020  Medication Sig Note   cloNIDine (CATAPRES) 0.1 MG tablet Take 1 tablet (0.1 mg total) by mouth 2 (two) times daily.    Dulaglutide (TRULICITY) 1.5 MG/0.5ML SOPN Inject 1.5 mg into the skin once a week.    amLODipine (NORVASC) 10 MG tablet Take 1 tablet (10 mg total) by mouth daily. 07/13/2019: QPM   benazepril (LOTENSIN) 40 MG tablet Take 1 tablet (40 mg total) by mouth daily. 07/13/2019: QAM   BIOTIN PO Take 1 capsule by mouth daily.    brimonidine (ALPHAGAN) 0.2 % ophthalmic solution Place 1 drop into both eyes in the morning and at bedtime.     carvedilol (COREG) 25 MG tablet Take 1 tablet (25 mg total) by mouth 2 (two) times daily with a meal.    cholecalciferol (VITAMIN D3) 25 MCG (1000 UNIT) tablet Take 1,000 Units by mouth daily.     dorzolamide-timolol (COSOPT) 22.3-6.8 MG/ML ophthalmic solution Place 1 drop into both eyes 2 (two) times daily.     fexofenadine (ALLEGRA) 180 MG tablet Take 180 mg by mouth daily.    fluticasone (FLONASE) 50 MCG/ACT nasal spray Use 2 spray(s) in each nostril once daily    furosemide (LASIX) 20 MG tablet Take 20 mg by mouth daily.    glucose blood (ONETOUCH ULTRA) test strip 1 each by Other route as needed for other. Use as instructed    latanoprost (XALATAN) 0.005 % ophthalmic solution Place 1 drop into both eyes at bedtime.     levothyroxine (SYNTHROID) 150 MCG tablet Take 1 tablet (150 mcg total) by mouth daily before breakfast.    lovastatin (MEVACOR) 40 MG tablet Take 1 tablet (40 mg total) by mouth at bedtime.    ONETOUCH DELICA LANCETS 33G MISC USE AS DIRECTED ONCE DAILY WITH TEST STRIPS E11.22    [DISCONTINUED] glipiZIDE (GLUCOTROL) 5 MG tablet Take 5 mg by mouth 2 (two) times daily. 02/02/2020: This medication was STOPPED, however patient reports still taking AS NEEDED for high blood sugar   No facility-administered encounter medications on file as of 03/29/2020.     Objective:   Goals Addressed              This Visit's Progress     Patient Stated     PharmD "I want to improve my diabetes" (pt-stated)        CARE PLAN ENTRY (see longtitudinal plan of care for additional care  plan information)  Current Barriers:   Diabetes: uncontrolled; most recent A1c 6.8% o Reports concern today regarding her daughter who has extensive metastatic cancer. Hospice may be coming in soon.  Current antihyperglycemic regimen: Trulicity 1.5 mg weekly o APPROVED for Trulicity assistance through [employer] through 10/13/20  Most recent eGFR: ~35 mL/min (per nephrology Dr. Juleen China)  Current blood glucose readings:  o Fasting: 150-170s, but this was consistent from the time of her previous A1c  Denies any episodes of hypoglycemia  Cardiovascular risk reduction (follows w/ [facility]  Cardiology Dr. Nehemiah Massed) o Current hypertensive regimen: carvedilol 25 mg BID, clonidine 0.1 mg BID, benazepril 40 mg QAM, amlodipine 10 mg daily, furosemide 20 mg daily; BP 130s/60s at home; reports they discussed s/sx hypotension, but that Dr. Nehemiah Massed attributed to adjusting to higher dose of Trulicity. Patient has not had any of these symptoms in ~1.5 months o Current hyperlipidemia regimen: lovastatin 40 mg; last LDL well controlled at 54  Pharmacist Clinical Goal(s):   Over the next 90 days, patient with work with PharmD and primary care provider to address optimized medication management  Interventions:  Comprehensive medication review performed, medication list updated in electronic medical record  Inter-disciplinary care team collaboration (see longitudinal plan of care)  Provided empathetic listening as patient discussed her daughter's health. Discussed appropriate storage for Trulicity if she had to quickly travel to her daugher in [place]. Reviewed refill procedure from [facility].   Reviewed goal A1c, usual goal fasting and goal 2 hour post prandial glucose. A1c next week w/ PCP.  Reviewed goal BP, and goal of no hypotensive episodes to reduce risk of falls. Patient will contact the office, Dr. Alveria Apley office, or myself if she starts to have these episodes again  Patient Self Care Activities:   Patient will check blood glucose BID, document, and provide at future appointments  Patient will take medications as prescribed  Patient will contact provider with any episodes of hypoglycemia  Patient will report any questions or concerns to provider   Please see past updates related to this goal by clicking on the "Past Updates" button in the selected goal          Plan:  - Scheduled f/u call in ~ 12 weeks  [name], PharmD, [place] [phone]

## 2020-03-29 NOTE — Patient Instructions (Signed)
Visit Information  Goals Addressed              This Visit's Progress     Patient Stated   .  PharmD "I want to improve my diabetes" (pt-stated)        CARE PLAN ENTRY (see longtitudinal plan of care for additional care plan information)  Current Barriers:  . Diabetes: uncontrolled; most recent A1c 6.8% o Reports concern today regarding her daughter who has extensive metastatic cancer. Hospice may be coming in soon. . Current antihyperglycemic regimen: Trulicity 1.5 mg weekly o APPROVED for Trulicity assistance through [place] through 10/13/20 . Most recent eGFR: ~35 mL/min (per nephrology Dr. Juleen China) . Current blood glucose readings:  o Fasting: 150-170s, but this was consistent from the time of her previous A1c . Denies any episodes of hypoglycemia . Cardiovascular risk reduction (follows w/ [facility] Cardiology Dr. Nehemiah Massed) o Current hypertensive regimen: carvedilol 25 mg BID, clonidine 0.1 mg BID, benazepril 40 mg QAM, amlodipine 10 mg daily, furosemide 20 mg daily; BP 130s/60s at home; reports they discussed s/sx hypotension, but that Dr. Nehemiah Massed attributed to adjusting to higher dose of Trulicity. Patient has not had any of these symptoms in ~1.5 months o Current hyperlipidemia regimen: lovastatin 40 mg; last LDL well controlled at 54  Pharmacist Clinical Goal(s):  Marland Kitchen Over the next 90 days, patient with work with PharmD and primary care provider to address optimized medication management  Interventions: . Comprehensive medication review performed, medication list updated in electronic medical record . Inter-disciplinary care team collaboration (see longitudinal plan of care) . Provided empathetic listening as patient discussed her daughter's health. Discussed appropriate storage for Trulicity if she had to quickly travel to her daugher in [place]. Reviewed refill procedure from [facility].  . Reviewed goal A1c, usual goal fasting and goal 2 hour post prandial glucose. A1c next week w/  PCP. Marland Kitchen Reviewed goal BP, and goal of no hypotensive episodes to reduce risk of falls. Patient will contact the office, Dr. Alveria Apley office, or myself if she starts to have these episodes again  Patient Self Care Activities:  . Patient will check blood glucose BID, document, and provide at future appointments . Patient will take medications as prescribed . Patient will contact provider with any episodes of hypoglycemia . Patient will report any questions or concerns to provider   Please see past updates related to this goal by clicking on the "Past Updates" button in the selected goal         Patient verbalizes understanding of instructions provided today.   Plan:  - Scheduled f/u call in ~ 12 weeks  [name], PharmD, [place] [phone]

## 2020-04-03 ENCOUNTER — Other Ambulatory Visit: Payer: Self-pay

## 2020-04-03 ENCOUNTER — Encounter: Payer: Self-pay | Admitting: Family Medicine

## 2020-04-03 ENCOUNTER — Ambulatory Visit (INDEPENDENT_AMBULATORY_CARE_PROVIDER_SITE_OTHER): Payer: PPO | Admitting: Family Medicine

## 2020-04-03 VITALS — BP 136/73 | HR 81 | Temp 97.9°F | Ht 60.63 in | Wt 178.6 lb

## 2020-04-03 DIAGNOSIS — I10 Essential (primary) hypertension: Secondary | ICD-10-CM | POA: Diagnosis not present

## 2020-04-03 DIAGNOSIS — J309 Allergic rhinitis, unspecified: Secondary | ICD-10-CM | POA: Diagnosis not present

## 2020-04-03 DIAGNOSIS — I2510 Atherosclerotic heart disease of native coronary artery without angina pectoris: Secondary | ICD-10-CM

## 2020-04-03 DIAGNOSIS — E785 Hyperlipidemia, unspecified: Secondary | ICD-10-CM | POA: Diagnosis not present

## 2020-04-03 DIAGNOSIS — N183 Chronic kidney disease, stage 3 unspecified: Secondary | ICD-10-CM | POA: Diagnosis not present

## 2020-04-03 DIAGNOSIS — E039 Hypothyroidism, unspecified: Secondary | ICD-10-CM

## 2020-04-03 DIAGNOSIS — E1122 Type 2 diabetes mellitus with diabetic chronic kidney disease: Secondary | ICD-10-CM

## 2020-04-03 LAB — BAYER DCA HB A1C WAIVED: HB A1C (BAYER DCA - WAIVED): 6.3 % (ref <7.0)

## 2020-04-03 MED ORDER — BENAZEPRIL HCL 40 MG PO TABS: 40.0000 mg | ORAL_TABLET | Freq: Every day | ORAL | 1 refills | Status: DC

## 2020-04-03 MED ORDER — LEVOTHYROXINE SODIUM 150 MCG PO TABS: 150.0000 ug | ORAL_TABLET | Freq: Every day | ORAL | 3 refills | Status: DC

## 2020-04-03 MED ORDER — TRULICITY 1.5 MG/0.5ML ~~LOC~~ SOAJ: 1.5000 mg | SUBCUTANEOUS | 1 refills | Status: DC

## 2020-04-03 MED ORDER — FLUTICASONE PROPIONATE 50 MCG/ACT NA SUSP: 2.0000 | Freq: Every day | NASAL | 12 refills | Status: DC

## 2020-04-03 MED ORDER — AMLODIPINE BESYLATE 10 MG PO TABS: 10.0000 mg | ORAL_TABLET | Freq: Every day | ORAL | 1 refills | Status: DC

## 2020-04-03 MED ORDER — CARVEDILOL 25 MG PO TABS: 25.0000 mg | ORAL_TABLET | Freq: Two times a day (BID) | ORAL | 1 refills | Status: DC

## 2020-04-03 MED ORDER — CLONIDINE HCL 0.1 MG PO TABS: 0.1000 mg | ORAL_TABLET | Freq: Two times a day (BID) | ORAL | 1 refills | Status: DC

## 2020-04-03 MED ORDER — LOVASTATIN 40 MG PO TABS: 40.0000 mg | ORAL_TABLET | Freq: Every day | ORAL | 1 refills | Status: DC

## 2020-04-03 NOTE — Assessment & Plan Note (Signed)
Doing great with A1c of 6.3. Continue current regimen. Continue to monitor. Recheck 6 months.

## 2020-04-03 NOTE — Progress Notes (Signed)
BP 136/73 (BP Location: Left Arm, Patient Position: Sitting, Cuff Size: Normal)   Pulse 81   Temp 97.9 F (36.6 C) (Oral)   Ht 5' 0.63" (1.54 m)   Wt 178 lb 9.6 oz (81 kg)   SpO2 97%   BMI 34.16 kg/m    Subjective:    Patient ID: Julia Nielsen, female    DOB: May 03, 1941, 79 y.o.   MRN: [id number]  HPI: Julia Nielsen is a 79 y.o. female  Chief Complaint  Patient presents with  . Diabetes   Daughter has end-stage lung cancer. Has been having a huge amount of stress with that. Has been really worried about her.   DIABETES Hypoglycemic episodes:no Polydipsia/polyuria: no Visual disturbance: no Chest pain: no Paresthesias: no Glucose Monitoring: yes  Accucheck frequency: occasionally  Fasting glucose: Taking Insulin?: no Blood Pressure Monitoring: not checking Retinal Examination: Up to Date Foot Exam: Up to Date Diabetic Education: Completed Pneumovax: Up to Date Influenza: Up to Date  Relevant past medical, surgical, family and social history reviewed and updated as indicated. Interim medical history since our last visit reviewed. Allergies and medications reviewed and updated.  Review of Systems  Constitutional: Negative.   Respiratory: Negative.   Cardiovascular: Negative.   Musculoskeletal: Negative.   Skin: Negative.   Neurological: Negative.   Psychiatric/Behavioral: Negative for agitation, behavioral problems, confusion, decreased concentration, dysphoric mood, hallucinations, self-injury, sleep disturbance and suicidal ideas. The patient is nervous/anxious. The patient is not hyperactive.     Per HPI unless specifically indicated above     Objective:    BP 136/73 (BP Location: Left Arm, Patient Position: Sitting, Cuff Size: Normal)   Pulse 81   Temp 97.9 F (36.6 C) (Oral)   Ht 5' 0.63" (1.54 m)   Wt 178 lb 9.6 oz (81 kg)   SpO2 97%   BMI 34.16 kg/m   Wt Readings from Last 3 Encounters:  04/03/20 178 lb 9.6 oz (81 kg)  09/07/19 182 lb (82.6  kg)  08/06/19 182 lb (82.6 kg)    Physical Exam Vitals and nursing note reviewed.  Constitutional:      General: She is not in acute distress.    Appearance: Normal appearance. She is not ill-appearing, toxic-appearing or diaphoretic.  HENT:     Head: Normocephalic and atraumatic.     Right Ear: External ear normal.     Left Ear: External ear normal.     Nose: Nose normal.     Mouth/Throat:     Mouth: Mucous membranes are moist.     Pharynx: Oropharynx is clear.  Eyes:     General: No scleral icterus.       Right eye: No discharge.        Left eye: No discharge.     Extraocular Movements: Extraocular movements intact.     Conjunctiva/sclera: Conjunctivae normal.     Pupils: Pupils are equal, round, and reactive to light.  Cardiovascular:     Rate and Rhythm: Normal rate and regular rhythm.     Pulses: Normal pulses.     Heart sounds: Normal heart sounds. No murmur heard.  No friction rub. No gallop.   Pulmonary:     Effort: Pulmonary effort is normal. No respiratory distress.     Breath sounds: Normal breath sounds. No stridor. No wheezing, rhonchi or rales.  Chest:     Chest wall: No tenderness.  Musculoskeletal:        General: Normal range of motion.  Cervical back: Normal range of motion and neck supple.  Skin:    General: Skin is warm and dry.     Capillary Refill: Capillary refill takes less than 2 seconds.     Coloration: Skin is not jaundiced or pale.     Findings: No bruising, erythema, lesion or rash.  Neurological:     General: No focal deficit present.     Mental Status: She is alert and oriented to person, place, and time. Mental status is at baseline.  Psychiatric:        Mood and Affect: Mood normal.        Behavior: Behavior normal.        Thought Content: Thought content normal.        Judgment: Judgment normal.     Results for orders placed or performed in visit on 12/20/19  Bayer DCA Hb A1c Waived  Result Value Ref Range   HB A1C (BAYER DCA  - WAIVED) 6.8 <7.0 %  Comprehensive metabolic panel  Result Value Ref Range   Glucose 208 (H) 65 - 99 mg/dL   BUN 19 8 - 27 mg/dL   Creatinine, Ser 1.31 (H) 0.57 - 1.00 mg/dL   GFR calc non Af Amer 38 (L) >59 mL/min/1.73   GFR calc Af Amer 44 (L) >59 mL/min/1.73   BUN/Creatinine Ratio 15 12 - 28   Sodium 141 134 - 144 mmol/L   Potassium 4.1 3.5 - 5.2 mmol/L   Chloride 106 96 - 106 mmol/L   CO2 20 20 - 29 mmol/L   Calcium 8.9 8.7 - 10.3 mg/dL   Total Protein 6.6 6.0 - 8.5 g/dL   Albumin 4.1 3.7 - 4.7 g/dL   Globulin, Total 2.5 1.5 - 4.5 g/dL   Albumin/Globulin Ratio 1.6 1.2 - 2.2   Bilirubin Total 0.6 0.0 - 1.2 mg/dL   Alkaline Phosphatase 53 39 - 117 IU/L   AST 28 0 - 40 IU/L   ALT 34 (H) 0 - 32 IU/L  Lipid Panel w/o Chol/HDL Ratio  Result Value Ref Range   Cholesterol, Total 142 100 - 199 mg/dL   Triglycerides 204 (H) 0 - 149 mg/dL   HDL 47 >39 mg/dL   VLDL Cholesterol Cal 33 5 - 40 mg/dL   LDL Chol Calc (NIH) 62 0 - 99 mg/dL      Assessment & Plan:   Problem List Items Addressed This Visit      Cardiovascular and Mediastinum   Hypertension   Relevant Medications   amLODipine (NORVASC) 10 MG tablet   benazepril (LOTENSIN) 40 MG tablet   carvedilol (COREG) 25 MG tablet   cloNIDine (CATAPRES) 0.1 MG tablet   lovastatin (MEVACOR) 40 MG tablet   CAD (coronary artery disease)   Relevant Medications   amLODipine (NORVASC) 10 MG tablet   benazepril (LOTENSIN) 40 MG tablet   carvedilol (COREG) 25 MG tablet   cloNIDine (CATAPRES) 0.1 MG tablet   lovastatin (MEVACOR) 40 MG tablet     Respiratory   Multiple respiratory allergies   Relevant Medications   fluticasone (FLONASE) 50 MCG/ACT nasal spray     Endocrine   Type 2 diabetes mellitus with stage 3 chronic kidney disease ([place]) - Primary    Doing great with A1c of 6.3. Continue current regimen. Continue to monitor. Recheck 6 months.       Relevant Medications   benazepril (LOTENSIN) 40 MG tablet   Dulaglutide  (TRULICITY) 1.5 GL/8.7FI SOPN   lovastatin (MEVACOR) 40 MG  tablet   Other Relevant Orders   Bayer DCA Hb A1c Waived   Hypothyroidism   Relevant Medications   carvedilol (COREG) 25 MG tablet   levothyroxine (SYNTHROID) 150 MCG tablet    Other Visit Diagnoses    Hyperlipidemia, unspecified hyperlipidemia type       Relevant Medications   amLODipine (NORVASC) 10 MG tablet   benazepril (LOTENSIN) 40 MG tablet   carvedilol (COREG) 25 MG tablet   cloNIDine (CATAPRES) 0.1 MG tablet   lovastatin (MEVACOR) 40 MG tablet       Follow up plan: Return in about 6 months (around 10/03/2020).

## 2020-05-01 ENCOUNTER — Ambulatory Visit (INDEPENDENT_AMBULATORY_CARE_PROVIDER_SITE_OTHER): Payer: PPO

## 2020-05-01 VITALS — Ht 62.0 in | Wt 176.0 lb

## 2020-05-01 DIAGNOSIS — Z0000 Encounter for general adult medical examination without abnormal findings: Secondary | ICD-10-CM | POA: Diagnosis not present

## 2020-05-01 NOTE — Patient Instructions (Signed)
Julia Nielsen , Thank you for taking time to come for your Medicare Wellness Visit. I appreciate your ongoing commitment to your health goals. Please review the following plan we discussed and let me know if I can assist you in the future.   Screening recommendations/referrals: Colonoscopy: not required Recommended yearly ophthalmology/optometry visit for glaucoma screening and checkup Recommended yearly dental visit for hygiene and checkup  Vaccinations: Influenza vaccine: completed 06/22/2019, due 05/14/2020 Pneumococcal vaccine: completed 12/08/2014 Tdap vaccine: due Shingles vaccine: discussed   Covid-19: 03/28/2020, 02/29/2020  Advanced directives: Advance directive discussed with you today.    Conditions/risks identified: none  Next appointment: Follow up in one year for your annual wellness visit.   Preventive Care 81 Years and Older, Female Preventive care refers to lifestyle choices and visits with your health care provider that can promote health and wellness. What does preventive care include?  A yearly physical exam. This is also called an annual well check.  Dental exams once or twice a year.  Routine eye exams. Ask your health care provider how often you should have your eyes checked.  Personal lifestyle choices, including:  Daily care of your teeth and gums.  Regular physical activity.  Eating a healthy diet.  Avoiding tobacco and drug use.  Limiting alcohol use.  Practicing safe sex.  Taking low doses of aspirin every day.  Taking vitamin and mineral supplements as recommended by your health care provider. What happens during an annual well check? The services and screenings done by your health care provider during your annual well check will depend on your age, overall health, lifestyle risk factors, and family history of disease. Counseling  Your health care provider may ask you questions about your:  Alcohol use.  Tobacco use.  Drug use.  Emotional  well-being.  Home and relationship well-being.  Sexual activity.  Eating habits.  History of falls.  Memory and ability to understand (cognition).  Work and work Statistician. Screening  You may have the following tests or measurements:  Height, weight, and BMI.  Blood pressure.  Lipid and cholesterol levels. These may be checked every 5 years, or more frequently if you are over 67 years old.  Skin check.  Lung cancer screening. You may have this screening every year starting at age 63 if you have a 30-pack-year history of smoking and currently smoke or have quit within the past 15 years.  Fecal occult blood test (FOBT) of the stool. You may have this test every year starting at age 78.  Flexible sigmoidoscopy or colonoscopy. You may have a sigmoidoscopy every 5 years or a colonoscopy every 10 years starting at age 58.  Prostate cancer screening. Recommendations will vary depending on your family history and other risks.  Hepatitis C blood test.  Hepatitis B blood test.  Sexually transmitted disease (STD) testing.  Diabetes screening. This is done by checking your blood sugar (glucose) after you have not eaten for a while (fasting). You may have this done every 1-3 years.  Abdominal aortic aneurysm (AAA) screening. You may need this if you are a current or former smoker.  Osteoporosis. You may be screened starting at age 50 if you are at high risk. Talk with your health care provider about your test results, treatment options, and if necessary, the need for more tests. Vaccines  Your health care provider may recommend certain vaccines, such as:  Influenza vaccine. This is recommended every year.  Tetanus, diphtheria, and acellular pertussis (Tdap, Td) vaccine. You may  need a Td booster every 10 years.  Zoster vaccine. You may need this after age 16.  Pneumococcal 13-valent conjugate (PCV13) vaccine. One dose is recommended after age 29.  Pneumococcal  polysaccharide (PPSV23) vaccine. One dose is recommended after age 15. Talk to your health care provider about which screenings and vaccines you need and how often you need them. This information is not intended to replace advice given to you by your health care provider. Make sure you discuss any questions you have with your health care provider. Document Released: 10/27/2015 Document Revised: 06/19/2016 Document Reviewed: 08/01/2015 Elsevier Interactive Patient Education  2017 [place] Prevention in the Home Falls can cause injuries. They can happen to people of all ages. There are many things you can do to make your home safe and to help prevent falls. What can I do on the outside of my home?  Regularly fix the edges of walkways and driveways and fix any cracks.  Remove anything that might make you trip as you walk through a door, such as a raised step or threshold.  Trim any bushes or trees on the path to your home.  Use bright outdoor lighting.  Clear any walking paths of anything that might make someone trip, such as rocks or tools.  Regularly check to see if handrails are loose or broken. Make sure that both sides of any steps have handrails.  Any raised decks and porches should have guardrails on the edges.  Have any leaves, snow, or ice cleared regularly.  Use sand or salt on walking paths during winter.  Clean up any spills in your garage right away. This includes oil or grease spills. What can I do in the bathroom?  Use night lights.  Install grab bars by the toilet and in the tub and shower. Do not use towel bars as grab bars.  Use non-skid mats or decals in the tub or shower.  If you need to sit down in the shower, use a plastic, non-slip stool.  Keep the floor dry. Clean up any water that spills on the floor as soon as it happens.  Remove soap buildup in the tub or shower regularly.  Attach bath mats securely with double-sided non-slip rug  tape.  Do not have throw rugs and other things on the floor that can make you trip. What can I do in the bedroom?  Use night lights.  Make sure that you have a light by your bed that is easy to reach.  Do not use any sheets or blankets that are too big for your bed. They should not hang down onto the floor.  Have a firm chair that has side arms. You can use this for support while you get dressed.  Do not have throw rugs and other things on the floor that can make you trip. What can I do in the kitchen?  Clean up any spills right away.  Avoid walking on wet floors.  Keep items that you use a lot in easy-to-reach places.  If you need to reach something above you, use a strong step stool that has a grab bar.  Keep electrical cords out of the way.  Do not use floor polish or wax that makes floors slippery. If you must use wax, use non-skid floor wax.  Do not have throw rugs and other things on the floor that can make you trip. What can I do with my stairs?  Do not leave any items on  the stairs.  Make sure that there are handrails on both sides of the stairs and use them. Fix handrails that are broken or loose. Make sure that handrails are as long as the stairways.  Check any carpeting to make sure that it is firmly attached to the stairs. Fix any carpet that is loose or worn.  Avoid having throw rugs at the top or bottom of the stairs. If you do have throw rugs, attach them to the floor with carpet tape.  Make sure that you have a light switch at the top of the stairs and the bottom of the stairs. If you do not have them, ask someone to add them for you. What else can I do to help prevent falls?  Wear shoes that:  Do not have high heels.  Have rubber bottoms.  Are comfortable and fit you well.  Are closed at the toe. Do not wear sandals.  If you use a stepladder:  Make sure that it is fully opened. Do not climb a closed stepladder.  Make sure that both sides of the  stepladder are locked into place.  Ask someone to hold it for you, if possible.  Clearly mark and make sure that you can see:  Any grab bars or handrails.  First and last steps.  Where the edge of each step is.  Use tools that help you move around (mobility aids) if they are needed. These include:  Canes.  Walkers.  Scooters.  Crutches.  Turn on the lights when you go into a dark area. Replace any light bulbs as soon as they burn out.  Set up your furniture so you have a clear path. Avoid moving your furniture around.  If any of your floors are uneven, fix them.  If there are any pets around you, be aware of where they are.  Review your medicines with your doctor. Some medicines can make you feel dizzy. This can increase your chance of falling. Ask your doctor what other things that you can do to help prevent falls. This information is not intended to replace advice given to you by your health care provider. Make sure you discuss any questions you have with your health care provider. Document Released: 07/27/2009 Document Revised: 03/07/2016 Document Reviewed: 11/04/2014 Elsevier Interactive Patient Education  2017 [employer].

## 2020-05-01 NOTE — Progress Notes (Signed)
I connected with Julia Nielsen today by telephone and verified that I am speaking with the correct person using two identifiers. Location patient: home Location provider: work Persons participating in the virtual visit: Julia Nielsen, Julia Durand LPN.   I discussed the limitations, risks, security and privacy concerns of performing an evaluation and management service by telephone and the availability of in person appointments. I also discussed with the patient that there may be a patient responsible charge related to this service. The patient expressed understanding and verbally consented to this telephonic visit.    Interactive audio and video telecommunications were attempted between this provider and patient, however failed, due to patient having technical difficulties OR patient did not have access to video capability.  We continued and completed visit with audio only.    Vital signs may be patient reported or missing.   Subjective:   Julia Nielsen is a 79 y.o. female who presents for Medicare Annual (Subsequent) preventive examination.  Review of Systems     Cardiac Risk Factors include: advanced age (>87mn, >>12women);diabetes mellitus;dyslipidemia;hypertension;obesity (BMI >30kg/m2)     Objective:    Today's Vitals   05/01/20 1026  Weight: 176 lb (79.8 kg)  Height: 5' 2" (1.575 m)   Body mass index is 32.19 kg/m.  Advanced Directives 05/01/2020 04/19/2019 08/12/2018 04/10/2018 03/21/2017 02/28/2017 02/28/2017  Does Patient Have a Medical Advance Directive? _0  No No  Would patient like information on creating a medical advance directive? - - No - Patient declined Yes (MAU/Ambulatory/Procedural Areas - Information given) Yes (MAU/Ambulatory/Procedural Areas - Information given) No - Patient declined -    Current Medications (verified) Outpatient Encounter Medications as of 05/01/2020  Medication Sig  . amLODipine (NORVASC) 10 MG tablet Take 1 tablet (10 mg total)  by mouth daily.  . benazepril (LOTENSIN) 40 MG tablet Take 1 tablet (40 mg total) by mouth daily.  .Marland KitchenBIOTIN PO Take 1 capsule by mouth daily.  . brimonidine (ALPHAGAN) 0.2 % ophthalmic solution Place 1 drop into both eyes in the morning and at bedtime.   . carvedilol (COREG) 25 MG tablet Take 1 tablet (25 mg total) by mouth 2 (two) times daily with a meal.  . cholecalciferol (VITAMIN D3) 25 MCG (1000 UNIT) tablet Take 1,000 Units by mouth daily.  . cloNIDine (CATAPRES) 0.1 MG tablet Take 1 tablet (0.1 mg total) by mouth 2 (two) times daily.  . dorzolamide-timolol (COSOPT) 22.3-6.8 MG/ML ophthalmic solution Place 1 drop into both eyes 2 (two) times daily.   . Dulaglutide (TRULICITY) 1.5 MCZ/6.6AYSOPN Inject 0.5 mLs (1.5 mg total) into the skin once a week.  . fexofenadine (ALLEGRA) 180 MG tablet Take 180 mg by mouth daily.  . fluticasone (FLONASE) 50 MCG/ACT nasal spray Place 2 sprays into both nostrils daily.  . furosemide (LASIX) 20 MG tablet Take 20 mg by mouth daily.  .Marland Kitchenglucose blood (ONETOUCH ULTRA) test strip 1 each by Other route as needed for other. Use as instructed  . latanoprost (XALATAN) 0.005 % ophthalmic solution Place 1 drop into both eyes at bedtime.   .Marland Kitchenlevothyroxine (SYNTHROID) 150 MCG tablet Take 1 tablet (150 mcg total) by mouth daily before breakfast.  . lovastatin (MEVACOR) 40 MG tablet Take 1 tablet (40 mg total) by mouth at bedtime.  .Glory RosebushDELICA LANCETS 330ZMISC USE AS DIRECTED ONCE DAILY WITH TEST STRIPS E11.22   No facility-administered encounter medications on file as of 05/01/2020.    Allergies (verified) Patient  has no known allergies.   History: Past Medical History:  Diagnosis Date  . Arthritis    OSTEO OF KNEE  . Breast cancer ([place]) 2000   left breast cancer, radiation tx's.  Marland Kitchen CAD (coronary artery disease)   . Carotid artery stenosis    bilateral  . Cataract   . Chronic kidney disease    Chronic stage 3  . Coronary heart disease   . Diabetes  mellitus without complication ([place])   . Edema of both legs   . GERD (gastroesophageal reflux disease)   . Glaucoma   . Hyperlipemia    mixed  . Hyperlipidemia   . Hypertension   . Hypothyroidism   . Mitral insufficiency    moderate  . Osteopenia   . Osteopenia   . Stenosis of carotid artery   . Thrombocytopenia ([place])   . Tricuspid insufficiency    moderate   Past Surgical History:  Procedure Laterality Date  . APPENDECTOMY    . BLADDER SURGERY    . BREAST EXCISIONAL BIOPSY Left 2000   breast ca  . BREAST LUMPECTOMY    . BREAST SURGERY    . CARDIAC CATHETERIZATION    . CARPAL TUNNEL RELEASE Bilateral   . CHOLECYSTECTOMY    . COLONOSCOPY    . COLONOSCOPY WITH PROPOFOL N/A 08/12/2018   Procedure: COLONOSCOPY WITH PROPOFOL;  Surgeon: Manya Silvas, MD;  Location[facility] END[facility]COPY;  Service: Endoscopy;  Laterality: N/A;  . JOINT REPLACEMENT Right 2015   knee  . parathyroid surgery    . SHOULDER SURGERY Right   . TUBAL LIGATION     Family History  Problem Relation Age of Onset  . Gallbladder disease Mother   . Aneurysm Mother   . COPD Father   . Congestive Heart Failure Father   . Emphysema Father   . Leukemia Sister   . Stroke Brother   . Dementia Brother   . Alzheimer's disease Brother   . Congestive Heart Failure Sister   . Emphysema Sister   . Breast cancer Neg Hx    Social History   Socioeconomic History  . Marital status: Married    Spouse name: Not on file  . Number of children: Not on file  . Years of education: Not on file  . Highest education level: High school graduate  Occupational History  . Occupation: retired  Tobacco Use  . Smoking status: Never Smoker  . Smokeless tobacco: Never Used  Vaping Use  . Vaping Use: Never used  Substance and Sexual Activity  . Alcohol use: No  . Drug use: No  . Sexual activity: Not on file  Other Topics Concern  . Not on file  Social History Narrative  . Not on file   Social Determinants of Health    Financial Resource Strain: Low Risk   . Difficulty of Paying Living Expenses: Not hard at all  Food Insecurity: No Food Insecurity  . Worried About Charity fundraiser in the Last Year: Never true  . Ran Out of Food in the Last Year: Never true  Transportation Needs: No Transportation Needs  . Lack of Transportation (Medical): No  . Lack of Transportation (Non-Medical): No  Physical Activity: Sufficiently Active  . Days of Exercise per Week: 7 days  . Minutes of Exercise per Session: 30 min  Stress: No Stress Concern Present  . Feeling of Stress : Not at all  Social Connections:   . Frequency of Communication with Friends and Family:   .  Frequency of Social Gatherings with Friends and Family:   . Attends Religious Services:   . Active Member of Clubs or Organizations:   . Attends Archivist Meetings:   Marland Kitchen Marital Status:     Tobacco Counseling Counseling given: Not Answered   Clinical Intake:  Pre-visit preparation completed: Yes  Pain : No/denies pain     Nutritional Status: BMI > 30  Obese Nutritional Risks: None Diabetes: Yes  How often do you need to have someone help you when you read instructions, pamphlets, or other written materials from your doctor or pharmacy?: 1 - Never What is the last grade level you completed in school?: 12th grade  Diabetic? Yes Nutrition Risk Assessment:  Has the patient had any N/V/D within the last 2 months?  No  Does the patient have any non-healing wounds?  No  Has the patient had any unintentional weight loss or weight gain?  No   Diabetes:  Is the patient diabetic?  Yes  If diabetic, was a CBG obtained today?  No  Did the patient bring in their glucometer from home?  No  How often do you monitor your CBG's? daily.   Financial Strains and Diabetes Management:  Are you having any financial strains with the device, your supplies or your medication? No .  Does the patient want to be seen by Chronic Care  Management for management of their diabetes?  No  Would the patient like to be referred to a Nutritionist or for Diabetic Management?  No   Diabetic Exams:  Diabetic Eye Exam: Completed 06/28/2019 Diabetic Foot Exam: Completed 09/16/2019   Interpreter Needed?: No  Information entered by :: NAllen LPN   Activities of Daily Living In your present state of health, do you have any difficulty performing the following activities: 05/01/2020 09/16/2019  Hearing? N N  Vision? N N  Difficulty concentrating or making decisions? N N  Walking or climbing stairs? N N  Dressing or bathing? N N  Doing errands, shopping? N N  Preparing Food and eating ? N -  Using the Toilet? N -  In the past six months, have you accidently leaked urine? N -  Do you have problems with loss of bowel control? N -  Managing your Medications? N -  Managing your Finances? N -  Housekeeping or managing your Housekeeping? N -  Some recent data might be hidden    Patient Care Team: Valerie Roys, DO as PCP - General (Family Medicine) De Hollingshead, [facility] as Pharmacist (Pharmacist)  Indicate any recent Medical Services you may have received from other than [facility] providers in the past year (date may be approximate).     Assessment:   This is a routine wellness examination for Julia Nielsen.  Hearing/Vision screen  Hearing Screening   125Hz 250Hz 500Hz 1000Hz 2000Hz 3000Hz 4000Hz 6000Hz 8000Hz  Right ear:           Left ear:           Vision Screening Comments: Regular eye exams, [facility]  Dietary issues and exercise activities discussed: Current Exercise Habits: Home exercise routine, Type of exercise: Other - see comments (pedal machine), Time (Minutes): 30, Frequency (Times/Week): 7, Weekly Exercise (Minutes/Week): 210  Goals    .  DIET - INCREASE WATER INTAKE      Recommend continue drinking at least 6-8 glasses of water a day     .  Patient Stated      05/01/2020, keep  A1C and weight down     .  PharmD "I want to improve my diabetes" (pt-stated)      CARE PLAN ENTRY (see longtitudinal plan of care for additional care plan information)  Current Barriers:  . Diabetes: uncontrolled; most recent A1c 6.8% o Reports concern today regarding her daughter who has extensive metastatic cancer. Hospice may be coming in soon. . Current antihyperglycemic regimen: Trulicity 1.5 mg weekly o APPROVED for Trulicity assistance through [place] through 10/13/20 . Most recent eGFR: ~35 mL/min (per nephrology Dr. Juleen China) . Current blood glucose readings:  o Fasting: 150-170s, but this was consistent from the time of her previous A1c . Denies any episodes of hypoglycemia . Cardiovascular risk reduction (follows w/ [facility] Dr. Nehemiah Massed) o Current hypertensive regimen: carvedilol 25 mg BID, clonidine 0.1 mg BID, benazepril 40 mg QAM, amlodipine 10 mg daily, furosemide 20 mg daily; BP 130s/60s at home; reports they discussed s/sx hypotension, but that Dr. Nehemiah Massed attributed to adjusting to higher dose of Trulicity. Patient has not had any of these symptoms in ~1.5 months o Current hyperlipidemia regimen: lovastatin 40 mg; last LDL well controlled at 54  Pharmacist Clinical Goal(s):  Marland Kitchen Over the next 90 days, patient with work with PharmD and primary care provider to address optimized medication management  Interventions: . Comprehensive medication review performed, medication list updated in electronic medical record . Inter-disciplinary care team collaboration (see longitudinal plan of care) . Provided empathetic listening as patient discussed her daughter's health. Discussed appropriate storage for Trulicity if she had to quickly travel to her [place] in [place]. Reviewed refill procedure from [facility].  . Reviewed goal A1c, usual goal fasting and goal 2 hour post prandial glucose. A1c next week w/ PCP. Marland Kitchen Reviewed goal BP, and goal of no hypotensive episodes to reduce risk of falls. Patient will  contact the office, Dr. Alveria Apley office, or myself if she starts to have these episodes again  Patient Self Care Activities:  . Patient will check blood glucose BID, document, and provide at future appointments . Patient will take medications as prescribed . Patient will contact provider with any episodes of hypoglycemia . Patient will report any questions or concerns to provider   Please see past updates related to this goal by clicking on the "Past Updates" button in the selected goal        Depression Screen PHQ 2/9 Scores 05/01/2020 09/16/2019 06/22/2019 04/19/2019 04/10/2018 11/10/2017 08/27/2017  PHQ - 2 Score 0 0 0 0 0 0 0  PHQ- 9 Score 0 - 0 - - - -    Fall Risk Fall Risk  05/01/2020 09/16/2019 06/22/2019 04/19/2019 04/10/2018  Falls in the past year? 0 0 0 0 No  Number falls in past yr: - 0 0 - -  Injury with Fall? - 0 0 - -  Risk for fall due to : Medication side effect - - - -  Follow up Falls evaluation completed;Education provided;Falls prevention discussed - Education provided - -    Any stairs in or around the home? Yes  If so, are there any without handrails? Yes  Home free of loose throw rugs in walkways, pet beds, electrical cords, etc? Yes  Adequate lighting in your home to reduce risk of falls? Yes   ASSISTIVE DEVICES UTILIZED TO PREVENT FALLS:  Life alert? No  Use of a cane, walker or w/c? Yes  Grab bars in the bathroom? Yes  Shower chair or bench in shower? Yes  Elevated toilet seat  or a handicapped toilet? No   TIMED UP AND GO:  Was the test performed? No .      Cognitive Function:     6CIT Screen 05/01/2020 06/22/2019 04/19/2019 04/10/2018  What Year? 0 points 0 points 0 points 0 points  What month? 0 points 0 points 0 points 0 points  What time? 0 points 0 points 0 points 0 points  Count back from 20 0 points 0 points 0 points 0 points  Months in reverse 0 points 0 points 0 points 0 points  Repeat phrase 0 points 0 points 0 points 0 points  Total Score 0  0 0 0    Immunizations Immunization History  Administered Date(s) Administered  . Fluad Quad(high Dose 65+) 06/22/2019  . Influenza, High Dose Seasonal PF 06/18/2016, 08/27/2017, 08/06/2018  . Influenza,inj,Quad PF,6+ Mos 07/05/2015  . Moderna SARS-COVID-2 Vaccination 02/29/2020, 03/28/2020  . Pneumococcal Conjugate-13 12/08/2014  . Pneumococcal-Unspecified 08/15/1999, 06/18/2005  . Td 01/23/2009    TDAP status: Due, Education has been provided regarding the importance of this vaccine. Advised may receive this vaccine at local pharmacy or Health Dept. Aware to provide a copy of the vaccination record if obtained from local pharmacy or Health Dept. Verbalized acceptance and understanding. Flu Vaccine status: Up to date Pneumococcal vaccine status: Up to date Covid-19 vaccine status: Completed vaccines  Qualifies for Shingles Vaccine? Yes   Zostavax completed No   Shingrix Completed?: No.    Education has been provided regarding the importance of this vaccine. Patient has been advised to call insurance company to determine out of pocket expense if they have not yet received this vaccine. Advised may also receive vaccine at local pharmacy or Health Dept. Verbalized acceptance and understanding.  Screening Tests Health Maintenance  Topic Date Due  . TETANUS/TDAP  01/24/2019  . INFLUENZA VACCINE  05/14/2020  . MAMMOGRAM  06/22/2020  . OPHTHALMOLOGY EXAM  06/27/2020  . FOOT EXAM  09/15/2020  . HEMOGLOBIN A1C  10/03/2020  . COLONOSCOPY  08/12/2021  . DEXA SCAN  Completed  . COVID-19 Vaccine  Completed  . PNA vac Low Risk Adult  Completed    Health Maintenance  Health Maintenance Due  Topic Date Due  . TETANUS/TDAP  01/24/2019    Colorectal cancer screening: No longer required.  Mammogram status: Completed 06/23/2019. Repeat every year Bone Density status: Completed 03/15/2014.   Lung Cancer Screening: (Low Dose CT Chest recommended if Age 67-80 years, 30 pack-year currently  smoking OR have quit w/in 15years.) does not qualify.   Lung Cancer Screening Referral: no   Additional Screening:  Hepatitis C Screening: does not qualify  Vision Screening: Recommended annual ophthalmology exams for early detection of glaucoma and other disorders of the eye. Is the patient up to date with their annual eye exam?  Yes  Who is the provider or what is the name of the office in which the patient attends annual eye exams? [facility] If pt is not established with a provider, would they like to be referred to a provider to establish care? No .   Dental Screening: Recommended annual dental exams for proper oral hygiene  Community Resource Referral / Chronic Care Management: CRR required this visit?  No   CCM required this visit?  No      Plan:     I have personally reviewed and noted the following in the patient's chart:   . Medical and social history . Use of alcohol, tobacco or illicit drugs  .  Current medications and supplements . Functional ability and status . Nutritional status . Physical activity . Advanced directives . List of other physicians . Hospitalizations, surgeries, and ER visits in previous 12 months . Vitals . Screenings to include cognitive, depression, and falls . Referrals and appointments  In addition, I have reviewed and discussed with patient certain preventive protocols, quality metrics, and best practice recommendations. A written personalized care plan for preventive services as well as general preventive health recommendations were provided to patient.   Due to this being a telephonic visit, the after visit summary with patients personalized plan was offered to patient via mail  Patient preferred to pick up at office at next visit.   Kellie Simmering, LPN   [id number]   Nurse Notes: Patient is due for a TDAP.

## 2020-05-09 ENCOUNTER — Other Ambulatory Visit: Payer: Self-pay

## 2020-06-14 ENCOUNTER — Other Ambulatory Visit: Payer: Self-pay | Admitting: Family Medicine

## 2020-06-14 DIAGNOSIS — Z1231 Encounter for screening mammogram for malignant neoplasm of breast: Secondary | ICD-10-CM

## 2020-06-20 ENCOUNTER — Other Ambulatory Visit: Payer: Self-pay | Admitting: Family Medicine

## 2020-06-20 DIAGNOSIS — J309 Allergic rhinitis, unspecified: Secondary | ICD-10-CM

## 2020-06-21 ENCOUNTER — Ambulatory Visit: Payer: Self-pay | Admitting: Pharmacist

## 2020-06-21 DIAGNOSIS — I10 Essential (primary) hypertension: Secondary | ICD-10-CM

## 2020-06-21 DIAGNOSIS — N183 Chronic kidney disease, stage 3 unspecified: Secondary | ICD-10-CM

## 2020-06-21 NOTE — Patient Instructions (Signed)
Visit Information  It was a pleasure speaking with you today. Thank you for letting me be part of your clinical team. Please call with any questions or concerns.   Goals Addressed              This Visit's Progress   .  PharmD "I want to improve my diabetes" (pt-stated)        CARE PLAN ENTRY (see longtitudinal plan of care for additional care plan information)  Current Barriers:  . Diabetes: uncontrolled; most recent A1c 6.3% o Reports concern today regarding her daughter, Andrey Campanile, who has extensive metastatic cancer. Patient and her spouse last visited about 2-3 weeks ago. They had not been to [place] in three years. Stays in contact via phone and friend, Gaynelle Adu, who takes daughter to doctor appointments and get groceries.Hospice services not in the home yet.  . Current antihyperglycemic regimen: Trulicity 1.5 mg weekly o APPROVED for Trulicity assistance through [employer] through 10/13/20 . Most recent eGFR: ~38 mL/min . Current blood glucose readings:  o Fasting: 120- 150s  . Denies any episodes of hypoglycemia . Osteopenia on last Dexa scan (2015) o Overdue for repeat Dexa per records available in chart (recommended to repeat in 2017) o Patient states she was taken off Calcium supplements due to kidney stones o She is no longer taking Vitamin D supplementation . Cardiovascular risk reduction (follows w/ [facility] Dr. Gwen Pounds) o Current hypertensive regimen: carvedilol 25 mg BID, clonidine 0.1 mg BID, benazepril 40 mg QAM, amlodipine 10 mg daily, furosemide 20 mg daily; BP 136/70 at home ~ three weeks ago. Reports not checking since returning from [place];Patient has not had any hypotensive symptoms in several months o Current hyperlipidemia regimen: lovastatin 40 mg; last LDL well controlled at 56  Pharmacist Clinical Goal(s):  Marland Kitchen Over the next 90 days, patient with work with PharmD and primary care provider to address optimized medication management  Interventions: . Comprehensive  medication review performed, medication list updated in electronic medical record . Inter-disciplinary care team collaboration (see longitudinal plan of care) . Provided empathetic listening as patient discussed her daughter's health. Discussed appropriate storage for Trulicity if she had to quickly travel to her daugher in [place]. Reviewed refill procedure from [facility].  . Reviewed goal A1c, usual goal fasting and goal 2 hour post prandial glucose.  . Reviewed goal BP, and goal of no hypotensive episodes to reduce risk of falls. Encouraged patient to resume checking BP 1-2 times weekly, documenting and bring to future appointments. . Reviewed NOF recommendations of 400- 1000 iu vitamin d for persons over 50 . Recommend Vit D level at next appointment in December and follow up DEXA scan  Patient Self Care Activities:  . Patient will check blood glucose BID, document, and provide at future appointments . Patient will take medications as prescribed . Patient will contact provider with any episodes of hypoglycemia . Patient will report any questions or concerns to provider   Please see past updates related to this goal by clicking on the "Past Updates" button in the selected goal         The patient verbalized understanding of instructions provided today and agreed to receive a mailed copy of patient instruction and/or educational materials.  Telephone follow up appointment with pharmacy team member scheduled for:  Mercer Pod. Tiburcio Pea PharmD, BCPS Clinical Pharmacist [phone]  Diabetes Mellitus and Sick Day Management Blood sugar (glucose) can be difficult to control when you are sick. Common illnesses that can cause  problems for people with diabetes (diabetes mellitus) include colds, fever, flu (influenza), nausea, vomiting, and diarrhea. These illnesses can cause stress and loss of body fluids (dehydration), and those issues can cause blood glucose levels to increase. Because of this, it  is very important to take your insulin and diabetes medicines and eat some form of carbohydrate when you are sick. You should make a plan for days when you are sick (sick day plan) as part of your diabetes management plan. You and your health care provider should make this plan in advance. The following guidelines are intended to help you manage an illness that lasts for about 24 hours or less. Your health care provider may also give you more specific instructions. What do I need to do to manage my blood glucose?   Check your blood glucose every 2-4 hours, or as often as told by your health care provider.  Know your sick day treatment goals. Your target blood glucose levels may be different when you are sick.  If you use insulin, take your usual dose. ? If your blood glucose continues to be too high, you may need to take an additional insulin dose as told by your health care provider.  If you use oral diabetes medicine, you may need to stop taking it if you are not able to eat or drink normally. Ask your health care provider about whether you need to stop taking these medicines while you are sick.  If you use injectable hormone medicines other than insulin to control your diabetes, ask your health care provider about whether you need to stop taking these medicines while you are sick. What else can I do to manage my diabetes when I am sick? Check your ketones  If you have type 1 diabetes, check your urine ketones every 4 hours.  If you have type 2 diabetes, check your urine ketones as often as told by your health care provider. Drink fluids  Drink enough fluid to keep your urine clear or pale yellow. This is especially important if you have a fever, vomiting, or diarrhea. Those symptoms can lead to dehydration.  Follow any instructions from your health care provider about beverages to avoid. ? Do not drink alcohol, caffeine, or drinks that contain a lot of sugar. Take medicines as  directed  Take-over-the-counter and prescription medicines only as told by your health care provider.  Check medicine labels for added sugars. Some medicines may contain sugar or types of sugars that can raise your blood glucose level. What foods can I eat when I am sick?  You need to eat some form of carbohydrates when you are sick. You should eat 45-50 grams (45-50 g) of carbohydrates every 3-4 hours until you feel better. All of the food choices below contain about 15 g of carbohydrates. Plan ahead and keep some of these foods around so you have them if you get sick.  4-6 oz (120-177 mL) carbonated beverage that contains sugar, such as regular (not diet) soda. You may be able to drink carbonated beverages more easily if you open the beverage and let it sit at room temperature for a few minutes before drinking.   of a twin frozen ice pop.  4 oz (120 g) regular gelatin.  4 oz (120 mL) fruit juice.  4 oz (120 g) ice cream or frozen yogurt.  2 oz (60 g) sherbet.  8 oz (240 mL) clear broth or soup.  4 oz (120 g) regular custard.  4  oz (120 g) regular pudding.  8 oz (240 g) plain yogurt.  1 slice bread or toast.  6 saltine crackers.  5 vanilla wafers. Questions to ask your health care provider Consider asking the following questions so you know what to do on days when you are sick:  Should I adjust my diabetes medicines?  How often do I need to check my blood glucose?  What supplies do I need to manage my diabetes at home when I am sick?  What number can I call if I have questions?  What foods and drinks should I avoid? Contact a health care provider if:  You develop symptoms of diabetic ketoacidosis, such as: ? Fatigue. ? Weight loss. ? Excessive thirst. ? Light-headedness. ? Fruity or sweet-smelling breath. ? Excessive urination. ? Vision changes. ? Confusion or irritability. ? Nausea. ? Vomiting. ? Rapid breathing. ? Pain in the abdomen. ? Feeling  flushed.  You are unable to drink fluids without vomiting.  You have any of the following for more than 6 hours: ? Nausea. ? Vomiting. ? Diarrhea.  Your blood glucose is at or above 240 mg/dL (13.3 mmol/L), even after you take an additional insulin dose.  You have a change in how you think, feel, or act (mental status).  You develop another serious illness.  You have been sick or have had a fever for 2 days or longer and you are not getting better. Get help right away if:  Your blood glucose is lower than 54 mg/dL (3.0 mmol/L).  You have difficulty breathing.  You have moderate or high ketone levels in your urine.  You used emergency glucagon to treat low blood glucose. Summary  Blood sugar (glucose) can be difficult to control when you are sick. Common illnesses that can cause problems for people with diabetes (diabetes mellitus) include colds, fever, flu (influenza), nausea, vomiting, and diarrhea.  Illnesses can cause stress and loss of body fluids (dehydration), and those issues can cause blood glucose levels to increase.  Make a plan for days when you are sick (sick day plan) as part of your diabetes management plan. You and your health care provider should make this plan in advance.  It is very important to take your insulin and diabetes medicines and to eat some form of carbohydrate when you are sick.  Contact your health care provider if have problems managing your blood glucose levels when you are sick, or if you have been sick or had a fever for 2 days or longer and are not getting better. This information is not intended to replace advice given to you by your health care provider. Make sure you discuss any questions you have with your health care provider. Document Revised: 06/28/2016 Document Reviewed: 06/28/2016 Elsevier Patient Education  [employer].

## 2020-06-21 NOTE — Progress Notes (Signed)
Chronic Care Management   Follow Up Note   06/21/2020 Name: Julia Nielsen MRN: [id number] DOB: Feb 24, 1941  Referred by: Valerie Roys, DO Reason for referral : Chronic Care Management (follow-up (DM. htn))   Julia Nielsen is a 79 y.o. year old female who is a primary care patient of Valerie Roys, DO. The CCM team was consulted for assistance with chronic disease management and care coordination needs.    Review of patient status, including review of consultants reports, relevant laboratory and other test results, and collaboration with appropriate care team members and the patient's provider was performed as part of comprehensive patient evaluation and provision of chronic care management services.    SDOH (Social Determinants of Health) assessments performed: No See Care Plan activities for detailed interventions related to [facility])     Outpatient Encounter Medications as of 06/21/2020  Medication Sig   amLODipine (NORVASC) 10 MG tablet Take 1 tablet (10 mg total) by mouth daily. (Patient taking differently: Take 10 mg by mouth every evening. )   benazepril (LOTENSIN) 40 MG tablet Take 1 tablet (40 mg total) by mouth daily.   BIOTIN PO Take 1 capsule by mouth daily.   brimonidine (ALPHAGAN) 0.2 % ophthalmic solution Place 1 drop into both eyes in the morning and at bedtime.    carvedilol (COREG) 25 MG tablet Take 1 tablet (25 mg total) by mouth 2 (two) times daily with a meal.   cloNIDine (CATAPRES) 0.1 MG tablet Take 1 tablet (0.1 mg total) by mouth 2 (two) times daily.   dorzolamide-timolol (COSOPT) 22.3-6.8 MG/ML ophthalmic solution Place 1 drop into both eyes 2 (two) times daily.    Dulaglutide (TRULICITY) 1.5 KG/2.5KY SOPN Inject 0.5 mLs (1.5 mg total) into the skin once a week.   fexofenadine (ALLEGRA) 180 MG tablet Take 180 mg by mouth daily.   fluticasone (FLONASE) 50 MCG/ACT nasal spray Place 2 sprays into both nostrils daily.   furosemide (LASIX) 20 MG tablet  Take 20 mg by mouth daily.   glucose blood (ONETOUCH ULTRA) test strip 1 each by Other route as needed for other. Use as instructed   latanoprost (XALATAN) 0.005 % ophthalmic solution Place 1 drop into both eyes at bedtime.    levothyroxine (SYNTHROID) 150 MCG tablet Take 1 tablet (150 mcg total) by mouth daily before breakfast.   lovastatin (MEVACOR) 40 MG tablet Take 1 tablet (40 mg total) by mouth at bedtime.   ONETOUCH DELICA LANCETS 70W MISC USE AS DIRECTED ONCE DAILY WITH TEST STRIPS E11.22   cholecalciferol (VITAMIN D3) 25 MCG (1000 UNIT) tablet Take 1,000 Units by mouth daily. (Patient not taking: Reported on 06/21/2020)   No facility-administered encounter medications on file as of 06/21/2020.     Objective:   Goals Addressed              This Visit's Progress     PharmD "I want to improve my diabetes" (pt-stated)        CARE PLAN ENTRY (see longtitudinal plan of care for additional care plan information)  Current Barriers:   Diabetes: uncontrolled; most recent A1c 6.3% o Reports concern today regarding her daughter, Lovey Newcomer, who has extensive metastatic cancer. Patient and her spouse last visited about 2-3 weeks ago. They had not been to [place] in three years. Stays in contact via phone and friend, Heath Lark, who takes daughter to doctor appointments and get groceries.Hospice services not in the home yet.   Current antihyperglycemic regimen: Trulicity 1.5 mg weekly  o APPROVED for Trulicity assistance through [employer] through 10/13/20  Most recent eGFR: ~38 mL/min  Current blood glucose readings:  o Fasting: 120- 150s   Denies any episodes of hypoglycemia  Osteopenia on last Dexa scan (2015) o Overdue for repeat Dexa per records available in chart (recommended to repeat in 2017) o Patient states she was taken off Calcium supplements due to kidney stones o She is no longer taking Vitamin D supplementation  Cardiovascular risk reduction (follows w/ [facility] Dr.  Nehemiah Massed) o Current hypertensive regimen: carvedilol 25 mg BID, clonidine 0.1 mg BID, benazepril 40 mg QAM, amlodipine 10 mg daily, furosemide 20 mg daily; BP 136/70 at home ~ three weeks ago. Reports not checking since returning from [place];Patient has not had any hypotensive symptoms in several months o Current hyperlipidemia regimen: lovastatin 40 mg; last LDL well controlled at 56  Pharmacist Clinical Goal(s):   Over the next 90 days, patient with work with PharmD and primary care provider to address optimized medication management  Interventions:  Comprehensive medication review performed, medication list updated in electronic medical record  Inter-disciplinary care team collaboration (see longitudinal plan of care)  Provided empathetic listening as patient discussed her daughter's health. Discussed appropriate storage for Trulicity if she had to quickly travel to her daugher in [place]. Reviewed refill procedure from [facility].   Reviewed goal A1c, usual goal fasting and goal 2 hour post prandial glucose.   Reviewed goal BP, and goal of no hypotensive episodes to reduce risk of falls. Encouraged patient to resume checking BP 1-2 times weekly, documenting and bring to future appointments.  Reviewed NOF recommendations of 400- 1000 iu vitamin d for persons over 98  Recommend Vit D level at next appointment in December and follow up DEXA scan  Patient Self Care Activities:   Patient will check blood glucose BID, document, and provide at future appointments  Patient will take medications as prescribed  Patient will contact provider with any episodes of hypoglycemia  Patient will report any questions or concerns to provider   Please see past updates related to this goal by clicking on the "Past Updates" button in the selected goal          Plan:   Telephone follow up appointment with care management team member scheduled for: December   Callyn Severtson S. Kenton Kingfisher PharmD, [facility] [phone]

## 2020-06-26 DIAGNOSIS — H401132 Primary open-angle glaucoma, bilateral, moderate stage: Secondary | ICD-10-CM | POA: Diagnosis not present

## 2020-07-03 DIAGNOSIS — H401132 Primary open-angle glaucoma, bilateral, moderate stage: Secondary | ICD-10-CM | POA: Diagnosis not present

## 2020-07-03 LAB — HM DIABETES EYE EXAM

## 2020-07-05 ENCOUNTER — Other Ambulatory Visit: Payer: Self-pay

## 2020-07-05 ENCOUNTER — Ambulatory Visit
Admission: RE | Admit: 2020-07-05 | Discharge: 2020-07-05 | Disposition: A | Discharge status: Home / Self Care | Payer: PPO | Source: Ambulatory Visit | Attending: Family Medicine | Admitting: Family Medicine

## 2020-07-05 DIAGNOSIS — Z1231 Encounter for screening mammogram for malignant neoplasm of breast: Secondary | ICD-10-CM

## 2020-07-09 ENCOUNTER — Encounter: Payer: Self-pay | Admitting: Family Medicine

## 2020-07-13 ENCOUNTER — Encounter: Payer: Self-pay | Admitting: Unknown Physician Specialty

## 2020-07-13 ENCOUNTER — Ambulatory Visit (INDEPENDENT_AMBULATORY_CARE_PROVIDER_SITE_OTHER): Payer: PPO | Admitting: Unknown Physician Specialty

## 2020-07-13 ENCOUNTER — Other Ambulatory Visit: Payer: Self-pay

## 2020-07-13 VITALS — BP 120/69 | HR 84 | Temp 98.1°F | Ht 62.0 in | Wt 180.0 lb

## 2020-07-13 DIAGNOSIS — Z23 Encounter for immunization: Secondary | ICD-10-CM

## 2020-07-13 DIAGNOSIS — R519 Headache, unspecified: Secondary | ICD-10-CM | POA: Diagnosis not present

## 2020-07-13 DIAGNOSIS — H538 Other visual disturbances: Secondary | ICD-10-CM | POA: Diagnosis not present

## 2020-07-13 NOTE — Progress Notes (Signed)
BP 120/69 (BP Location: Left Arm, Patient Position: Sitting, Cuff Size: Large)   Pulse 84   Temp 98.1 F (36.7 C) (Oral)   Ht _0  (1.575 m)   Wt 180 lb (81.6 kg)   SpO2 98%   BMI 32.92 kg/m    Subjective:    Patient ID: Julia Nielsen, female    DOB: [birth year], 79 y.o.   MRN: [id number]  HPI: Julia Nielsen is a 79 y.o. female who presents with right eye discomfort in outer canthus that has been intermittent over the past year. She describes the discomfort as a throbbing, burning sensation and feels like something is in the corner of her eye. Denies flashes or light or zig-zags. These episodes occurs every 3-4 weeks usually last 3-4 days but this episode has been ongoing since Saturday. Occasionally she experiences a right throbbing temporal headache that does not radiate with this eye discomfort  that she rates 10/10 that is not relieved by Tylenol nor Excedrin Migraine. Reports this is a new headache and the worse she has ever experienced.      Chief Complaint  Patient presents with  . Diabetes  . Hypertension  . Hypothyroidism    Relevant past medical, surgical, family and social history reviewed and updated as indicated. Interim medical history since our last visit reviewed. Allergies and medications reviewed and updated.  Review of Systems  Constitutional: Negative.   HENT: Positive for rhinorrhea (occasionally in AM, Hx of seasonal allergies).   Eyes: Positive for pain (and tearing ).  Respiratory: Negative.   Cardiovascular: Negative.   Gastrointestinal: Negative.   Genitourinary: Negative.   Skin: Negative.   Allergic/Immunologic: Positive for environmental allergies.  Neurological: Positive for headaches.  Hematological: Negative.   Psychiatric/Behavioral: Negative.     Per HPI unless specifically indicated above     Objective:    BP 120/69 (BP Location: Left Arm, Patient Position: Sitting, Cuff Size: Large)   Pulse 84   Temp 98.1 F (36.7 C) (Oral)    Ht _1  (1.575 m)   Wt 180 lb (81.6 kg)   SpO2 98%   BMI 32.92 kg/m   Wt Readings from Last 3 Encounters:  07/13/20 180 lb (81.6 kg)  05/01/20 176 lb (79.8 kg)  04/03/20 178 lb 9.6 oz (81 kg)    Physical Exam Vitals reviewed.  Constitutional:      Appearance: Normal appearance.  HENT:     Head: Normocephalic and atraumatic.     Nose: Nose normal.  Eyes:     Extraocular Movements: Extraocular movements intact.     Conjunctiva/sclera: Conjunctivae normal.     Pupils: Pupils are equal, round, and reactive to light.  Cardiovascular:     Rate and Rhythm: Normal rate and regular rhythm.     Pulses: Normal pulses.     Heart sounds: Normal heart sounds.  Pulmonary:     Effort: Pulmonary effort is normal.     Breath sounds: Normal breath sounds.  Abdominal:     General: Bowel sounds are normal.     Palpations: Abdomen is soft.  Musculoskeletal:     Cervical back: Normal range of motion.     Comments: Use cane for stability   Skin:    General: Skin is warm and dry.  Neurological:     General: No focal deficit present.     Mental Status: She is alert and oriented to person, place, and time.  Psychiatric:  Behavior: Behavior normal.        Thought Content: Thought content normal.        Judgment: Judgment normal.     Comments: Some stress over a child that's dealing with cancer.      Results for orders placed or performed in visit on 07/05/20  HM DIABETES EYE EXAM  Result Value Ref Range   HM Diabetic Eye Exam No Retinopathy No Retinopathy      Assessment & Plan:   Problem List Items Addressed This Visit    None    Visit Diagnoses    Flu vaccine need    -  Primary   Relevant Orders   Flu Vaccine QUAD High Dose(Fluad) (Completed)   Right sided temporal headache       New problem. No photophobia or phonophobia. No tenderness to tapping over temporal artery. ESR & CRP labs ordered. CT w/o contrast. RTC in 1 week.   Relevant Orders   C-reactive protein    Sed Rate (ESR)   CT Head Wo Contrast   Blurred vision, right eye       Seems to be associated with headache.  Opthamologist found no problems.  Ordered CT scan.  CRP/Sed rate       Follow up plan: Return in about 1 week (around 07/20/2020) for Lab results & stress management.

## 2020-07-14 ENCOUNTER — Ambulatory Visit
Admission: RE | Admit: 2020-07-14 | Discharge: 2020-07-14 | Disposition: A | Discharge status: Home / Self Care | Payer: PPO | Source: Ambulatory Visit | Attending: Unknown Physician Specialty | Admitting: Unknown Physician Specialty

## 2020-07-14 ENCOUNTER — Other Ambulatory Visit: Payer: Self-pay

## 2020-07-14 DIAGNOSIS — R519 Headache, unspecified: Secondary | ICD-10-CM | POA: Insufficient documentation

## 2020-07-14 LAB — SEDIMENTATION RATE: Sed Rate: 14 mm/hr (ref 0–40)

## 2020-07-14 LAB — C-REACTIVE PROTEIN: CRP: <1 mg/L (ref 0–10)

## 2020-07-24 DIAGNOSIS — N1832 Chronic kidney disease, stage 3b: Secondary | ICD-10-CM | POA: Diagnosis not present

## 2020-07-24 DIAGNOSIS — R809 Proteinuria, unspecified: Secondary | ICD-10-CM | POA: Diagnosis not present

## 2020-07-24 DIAGNOSIS — I129 Hypertensive chronic kidney disease with stage 1 through stage 4 chronic kidney disease, or unspecified chronic kidney disease: Secondary | ICD-10-CM | POA: Diagnosis not present

## 2020-07-24 DIAGNOSIS — E876 Hypokalemia: Secondary | ICD-10-CM | POA: Diagnosis not present

## 2020-07-24 DIAGNOSIS — E1122 Type 2 diabetes mellitus with diabetic chronic kidney disease: Secondary | ICD-10-CM | POA: Diagnosis not present

## 2020-07-24 DIAGNOSIS — N2889 Other specified disorders of kidney and ureter: Secondary | ICD-10-CM | POA: Diagnosis not present

## 2020-07-24 DIAGNOSIS — D631 Anemia in chronic kidney disease: Secondary | ICD-10-CM | POA: Diagnosis not present

## 2020-07-24 DIAGNOSIS — N2581 Secondary hyperparathyroidism of renal origin: Secondary | ICD-10-CM | POA: Diagnosis not present

## 2020-07-27 ENCOUNTER — Encounter: Payer: Self-pay | Admitting: Unknown Physician Specialty

## 2020-07-27 ENCOUNTER — Other Ambulatory Visit: Payer: Self-pay

## 2020-07-27 ENCOUNTER — Ambulatory Visit (INDEPENDENT_AMBULATORY_CARE_PROVIDER_SITE_OTHER): Payer: PPO | Admitting: Unknown Physician Specialty

## 2020-07-27 VITALS — BP 127/75 | HR 84 | Temp 98.7°F | Ht 62.0 in | Wt 180.0 lb

## 2020-07-27 DIAGNOSIS — H1089 Other conjunctivitis: Secondary | ICD-10-CM | POA: Diagnosis not present

## 2020-07-27 NOTE — Progress Notes (Signed)
BP 127/75 (BP Location: Left Arm, Patient Position: Sitting, Cuff Size: Large)   Pulse 84   Temp 98.7 F (37.1 C) (Oral)   Ht 5' 2" (1.575 m)   Wt 180 lb (81.6 kg)   SpO2 97%   BMI 32.92 kg/m    Subjective:    Patient ID: Julia Nielsen, female    DOB: 01/15/1941, 79 y.o.   MRN: [id number]  HPI: Julia Nielsen is a 79 y.o. female  Chief Complaint  Patient presents with  . Follow-up    lab results  . Stress   Pt had symptoms concerning temporal arteritis and right eye irritation.  Her daughter suggested to stop a treatment she was using for her eye.  Symptoms have resolved.    Relevant past medical, surgical, family and social history reviewed and updated as indicated. Interim medical history since our last visit reviewed. Allergies and medications reviewed and updated.  Review of Systems  Per HPI unless specifically indicated above     Objective:    BP 127/75 (BP Location: Left Arm, Patient Position: Sitting, Cuff Size: Large)   Pulse 84   Temp 98.7 F (37.1 C) (Oral)   Ht 5' 2" (1.575 m)   Wt 180 lb (81.6 kg)   SpO2 97%   BMI 32.92 kg/m   Wt Readings from Last 3 Encounters:  07/27/20 180 lb (81.6 kg)  07/13/20 180 lb (81.6 kg)  05/01/20 176 lb (79.8 kg)    Physical Exam Constitutional:      General: She is not in acute distress.    Appearance: Normal appearance. She is well-developed.  HENT:     Head: Normocephalic and atraumatic.  Eyes:     General: Lids are normal. No scleral icterus.       Right eye: No discharge.        Left eye: No discharge.     Comments: Mild right lateral cunjunctivitis  Cardiovascular:     Rate and Rhythm: Normal rate.  Pulmonary:     Effort: Pulmonary effort is normal.  Abdominal:     Palpations: There is no hepatomegaly or splenomegaly.  Musculoskeletal:        General: Normal range of motion.  Skin:    Coloration: Skin is not pale.     Findings: No rash.  Neurological:     Mental Status: She is alert and oriented  to person, place, and time.  Psychiatric:        Behavior: Behavior normal.        Thought Content: Thought content normal.        Judgment: Judgment normal.    ESR and CRP were normal.    Results for orders placed or performed in visit on 07/13/20  C-reactive protein  Result Value Ref Range   CRP <1 0 - 10 mg/L  Sed Rate (ESR)  Result Value Ref Range   Sed Rate 14 0 - 40 mm/hr      Assessment & Plan:   Problem List Items Addressed This Visit    None    Visit Diagnoses    Other conjunctivitis of right eye    -  Primary   This seems to be improving with decreasing an OTC treatment she was using.          Follow up plan: Return if symptoms worsen or fail to improve.      

## 2020-07-28 ENCOUNTER — Other Ambulatory Visit: Payer: Self-pay | Admitting: Family Medicine

## 2020-07-28 DIAGNOSIS — I10 Essential (primary) hypertension: Secondary | ICD-10-CM

## 2020-07-28 MED ORDER — BENAZEPRIL HCL 40 MG PO TABS: 40.0000 mg | ORAL_TABLET | Freq: Every day | ORAL | 1 refills | Status: DC

## 2020-07-28 NOTE — Telephone Encounter (Signed)
Medication Refill - Medication: Benazepril 40 mg  Has the patient contacted their pharmacy? Yes.   (Agent: If no, request that the patient contact the pharmacy for the refill.) (Agent: If yes, when and what did the pharmacy advise?)  Preferred Pharmacy (with phone number or street name):  [facility] [place] [address]  [place] [address]  Phone: [phone] Fax: [phone]  Hours: Not open 24 hours     Agent: Please be advised that RX refills may take up to 3 business days. We ask that you follow-up with your pharmacy.

## 2020-08-02 DIAGNOSIS — I129 Hypertensive chronic kidney disease with stage 1 through stage 4 chronic kidney disease, or unspecified chronic kidney disease: Secondary | ICD-10-CM | POA: Diagnosis not present

## 2020-08-02 DIAGNOSIS — N1832 Chronic kidney disease, stage 3b: Secondary | ICD-10-CM | POA: Diagnosis not present

## 2020-08-02 DIAGNOSIS — D631 Anemia in chronic kidney disease: Secondary | ICD-10-CM | POA: Diagnosis not present

## 2020-08-02 DIAGNOSIS — E876 Hypokalemia: Secondary | ICD-10-CM | POA: Diagnosis not present

## 2020-08-02 DIAGNOSIS — N2581 Secondary hyperparathyroidism of renal origin: Secondary | ICD-10-CM | POA: Diagnosis not present

## 2020-08-02 DIAGNOSIS — R809 Proteinuria, unspecified: Secondary | ICD-10-CM | POA: Diagnosis not present

## 2020-08-02 DIAGNOSIS — E1122 Type 2 diabetes mellitus with diabetic chronic kidney disease: Secondary | ICD-10-CM | POA: Diagnosis not present

## 2020-08-02 DIAGNOSIS — N2889 Other specified disorders of kidney and ureter: Secondary | ICD-10-CM | POA: Diagnosis not present

## 2020-08-15 DIAGNOSIS — I340 Nonrheumatic mitral (valve) insufficiency: Secondary | ICD-10-CM | POA: Diagnosis not present

## 2020-08-15 DIAGNOSIS — I6523 Occlusion and stenosis of bilateral carotid arteries: Secondary | ICD-10-CM | POA: Diagnosis not present

## 2020-08-15 DIAGNOSIS — I071 Rheumatic tricuspid insufficiency: Secondary | ICD-10-CM | POA: Diagnosis not present

## 2020-08-15 DIAGNOSIS — I10 Essential (primary) hypertension: Secondary | ICD-10-CM | POA: Diagnosis not present

## 2020-08-15 DIAGNOSIS — E782 Mixed hyperlipidemia: Secondary | ICD-10-CM | POA: Diagnosis not present

## 2020-08-15 DIAGNOSIS — R0989 Other specified symptoms and signs involving the circulatory and respiratory systems: Secondary | ICD-10-CM | POA: Diagnosis not present

## 2020-08-15 DIAGNOSIS — I2510 Atherosclerotic heart disease of native coronary artery without angina pectoris: Secondary | ICD-10-CM | POA: Diagnosis not present

## 2020-08-19 ENCOUNTER — Other Ambulatory Visit: Payer: Self-pay | Admitting: Nurse Practitioner

## 2020-08-19 DIAGNOSIS — E785 Hyperlipidemia, unspecified: Secondary | ICD-10-CM

## 2020-08-19 NOTE — Telephone Encounter (Signed)
Requested Prescriptions  Pending Prescriptions Disp Refills   lovastatin (MEVACOR) 40 MG tablet [Pharmacy Med Name: Lovastatin 40 MG Oral Tablet] 90 tablet 0    Sig: TAKE 1 TABLET BY MOUTH AT BEDTIME     Cardiovascular:  Antilipid - Statins Failed - 08/19/2020  8:33 AM      Failed - LDL in normal range and within 360 days    LDL Chol Calc (NIH)  Date Value Ref Range Status  12/20/2019 62 0 - 99 mg/dL Final         Failed - Triglycerides in normal range and within 360 days    Triglycerides  Date Value Ref Range Status  12/20/2019 204 (H) 0 - 149 mg/dL Final   Triglycerides Piccolo,Waived  Date Value Ref Range Status  10/29/2018 146 <150 mg/dL Final    Comment:                            Normal                   <150                         Borderline High     150 - 199                         High                200 - 499                         Very High                >499          Passed - Total Cholesterol in normal range and within 360 days    Cholesterol, Total  Date Value Ref Range Status  12/20/2019 142 100 - 199 mg/dL Final   Cholesterol Piccolo, Waived  Date Value Ref Range Status  10/29/2018 144 <200 mg/dL Final    Comment:                            Desirable                <200                         Borderline High      200- 239                         High                     >239          Passed - HDL in normal range and within 360 days    HDL  Date Value Ref Range Status  12/20/2019 47 >39 mg/dL Final         Passed - Patient is not pregnant      Passed - Valid encounter within last 12 months    Recent Outpatient Visits          3 weeks ago Other conjunctivitis of right eye   [place], NP   1 month ago Flu vaccine need   [facility] [place], [place],  NP   4 months ago Type 2 diabetes mellitus with stage 3 chronic kidney disease, without long-term current use of insulin, unspecified whether stage 3a  or 3b CKD ([place])   [place], Megan P, DO   8 months ago Benign essential hypertension   [facility] [place], Megan P, DO   11 months ago Type 2 diabetes mellitus with stage 3 chronic kidney disease, without long-term current use of insulin, unspecified whether stage 3a or 3b CKD ([place])   [place], Megan P, DO      Future Appointments            In 1 month Johnson, Barb Merino, DO [place]   In 8 months  [employer], [facility]

## 2020-09-01 DIAGNOSIS — I6523 Occlusion and stenosis of bilateral carotid arteries: Secondary | ICD-10-CM | POA: Diagnosis not present

## 2020-09-01 DIAGNOSIS — R0989 Other specified symptoms and signs involving the circulatory and respiratory systems: Secondary | ICD-10-CM | POA: Diagnosis not present

## 2020-10-03 ENCOUNTER — Telehealth: Payer: Self-pay | Admitting: Pharmacist

## 2020-10-03 NOTE — Progress Notes (Signed)
    Chronic Care Management Pharmacy Assistant   Name: Julia Nielsen  MRN: [id number] DOB: Jun 01, 1941  Reason for Encounter: Patient Assistance Application   PCP : Valerie Roys, DO  Allergies:  No Known Allergies  Medications: Outpatient Encounter Medications as of 10/03/2020  Medication Sig  . amLODipine (NORVASC) 10 MG tablet Take 1 tablet (10 mg total) by mouth daily. (Patient taking differently: Take 10 mg by mouth every evening. )  . benazepril (LOTENSIN) 40 MG tablet Take 1 tablet (40 mg total) by mouth daily.  . carvedilol (COREG) 25 MG tablet Take 1 tablet (25 mg total) by mouth 2 (two) times daily with a meal.  . cloNIDine (CATAPRES) 0.1 MG tablet Take 1 tablet (0.1 mg total) by mouth 2 (two) times daily.  . dorzolamide-timolol (COSOPT) 22.3-6.8 MG/ML ophthalmic solution Place 1 drop into both eyes 2 (two) times daily.   . Dulaglutide (TRULICITY) 1.5 PO/2.4MP SOPN Inject 0.5 mLs (1.5 mg total) into the skin once a week.  . fexofenadine (ALLEGRA) 180 MG tablet Take 180 mg by mouth daily.  . fluticasone (FLONASE) 50 MCG/ACT nasal spray Place 2 sprays into both nostrils daily.  . furosemide (LASIX) 20 MG tablet Take 20 mg by mouth daily.  Marland Kitchen glucose blood (ONETOUCH ULTRA) test strip 1 each by Other route as needed for other. Use as instructed  . latanoprost (XALATAN) 0.005 % ophthalmic solution Place 1 drop into both eyes at bedtime.   Marland Kitchen levothyroxine (SYNTHROID) 150 MCG tablet Take 1 tablet (150 mcg total) by mouth daily before breakfast.  . lovastatin (MEVACOR) 40 MG tablet TAKE 1 TABLET BY MOUTH AT BEDTIME  . ONETOUCH DELICA LANCETS 53I MISC USE AS DIRECTED ONCE DAILY WITH TEST STRIPS E11.22   No facility-administered encounter medications on file as of 10/03/2020.    Current Diagnosis: Patient Active Problem List   Diagnosis Date Noted  . Atherosclerosis of aorta ([place]) 09/16/2019  . Cirrhosis of liver without ascites ([place]) 09/16/2019  . Renal cyst 09/16/2019  .  Hyperlipemia, mixed 08/03/2019  . Osteoarthritis of knee 05/27/2018  . Advanced care planning/counseling discussion 12/02/2017  . Idiopathic thrombocytopenia ([place]) 02/10/2017  . Bilateral leg edema 06/06/2016  . Hypothyroidism 12/11/2015  . Osteopenia 12/11/2015  . Multiple respiratory allergies 12/11/2015  . CAD (coronary artery disease) 07/05/2015  . Type 2 diabetes mellitus with stage 3 chronic kidney disease ([place]) 07/05/2015  . Benign hypertensive renal disease 07/05/2015  . Secondary hyperparathyroidism of renal origin ([place]) 07/05/2015  . Anemia of chronic renal failure 07/05/2015  . Hypertension 03/27/2015  . Hyperlipidemia associated with type 2 diabetes mellitus ([place]) 03/27/2015  . Bilateral carotid artery stenosis 02/09/2015  . Benign essential hypertension 02/01/2015  . CKD (chronic kidney disease) stage 3, GFR 30-59 ml/min (HCC) 10/11/2014  . Moderate mitral insufficiency 10/11/2014  . Moderate tricuspid insufficiency 10/11/2014    10-03-2020: Called the patient to explain her patient assistance application was needing her signature and proof of income before the provider could sign the application for completion. The patient reported she would be at the office on 10-04-20 at 8 am due to having a scheduled MD appointment. Made PharmD aware of conversation .   Cloretta Ned, LPN Clinical Pharmacist Assistant  [phone]   Follow-Up:  Patient Assistance Coordination

## 2020-10-04 ENCOUNTER — Encounter: Payer: Self-pay | Admitting: Family Medicine

## 2020-10-04 ENCOUNTER — Other Ambulatory Visit: Payer: Self-pay

## 2020-10-04 ENCOUNTER — Ambulatory Visit (INDEPENDENT_AMBULATORY_CARE_PROVIDER_SITE_OTHER): Payer: PPO | Admitting: Family Medicine

## 2020-10-04 VITALS — BP 130/70 | HR 88 | Temp 97.6°F | Ht 61.0 in | Wt 180.2 lb

## 2020-10-04 DIAGNOSIS — D693 Immune thrombocytopenic purpura: Secondary | ICD-10-CM | POA: Diagnosis not present

## 2020-10-04 DIAGNOSIS — I700 Atherosclerosis of aorta: Secondary | ICD-10-CM | POA: Diagnosis not present

## 2020-10-04 DIAGNOSIS — S41101A Unspecified open wound of right upper arm, initial encounter: Secondary | ICD-10-CM | POA: Diagnosis not present

## 2020-10-04 DIAGNOSIS — I2510 Atherosclerotic heart disease of native coronary artery without angina pectoris: Secondary | ICD-10-CM | POA: Diagnosis not present

## 2020-10-04 DIAGNOSIS — Z23 Encounter for immunization: Secondary | ICD-10-CM

## 2020-10-04 DIAGNOSIS — R8281 Pyuria: Secondary | ICD-10-CM

## 2020-10-04 DIAGNOSIS — N183 Chronic kidney disease, stage 3 unspecified: Secondary | ICD-10-CM

## 2020-10-04 DIAGNOSIS — Z0000 Encounter for general adult medical examination without abnormal findings: Secondary | ICD-10-CM | POA: Diagnosis not present

## 2020-10-04 DIAGNOSIS — K7460 Unspecified cirrhosis of liver: Secondary | ICD-10-CM

## 2020-10-04 DIAGNOSIS — E039 Hypothyroidism, unspecified: Secondary | ICD-10-CM

## 2020-10-04 DIAGNOSIS — E785 Hyperlipidemia, unspecified: Secondary | ICD-10-CM | POA: Diagnosis not present

## 2020-10-04 DIAGNOSIS — I129 Hypertensive chronic kidney disease with stage 1 through stage 4 chronic kidney disease, or unspecified chronic kidney disease: Secondary | ICD-10-CM

## 2020-10-04 DIAGNOSIS — E1169 Type 2 diabetes mellitus with other specified complication: Secondary | ICD-10-CM | POA: Diagnosis not present

## 2020-10-04 DIAGNOSIS — E1122 Type 2 diabetes mellitus with diabetic chronic kidney disease: Secondary | ICD-10-CM | POA: Diagnosis not present

## 2020-10-04 LAB — MICROSCOPIC EXAMINATION: WBC, UA: >30 /hpf — AB (ref 0–5)

## 2020-10-04 LAB — URINALYSIS, ROUTINE W REFLEX MICROSCOPIC
Bilirubin, UA: NEGATIVE
Glucose, UA: NEGATIVE
Ketones, UA: NEGATIVE
Nitrite, UA: NEGATIVE
Specific Gravity, UA: 1.01 (ref 1.005–1.030)
Urobilinogen, Ur: 0.2 mg/dL (ref 0.2–1.0)
pH, UA: 5 (ref 5.0–7.5)

## 2020-10-04 LAB — BAYER DCA HB A1C WAIVED: HB A1C (BAYER DCA - WAIVED): 6.3 % (ref <7.0)

## 2020-10-04 LAB — MICROALBUMIN, URINE WAIVED
Creatinine, Urine Waived: 200 mg/dL (ref 10–300)
Microalb, Ur Waived: 80 mg/L — ABNORMAL HIGH (ref 0–19)

## 2020-10-04 MED ORDER — CARVEDILOL 25 MG PO TABS: 25.0000 mg | ORAL_TABLET | Freq: Two times a day (BID) | ORAL | 1 refills | Status: DC

## 2020-10-04 MED ORDER — LOVASTATIN 40 MG PO TABS
40.0000 mg | ORAL_TABLET | Freq: Every day | ORAL | 1 refills | Status: DC
Start: 2020-10-04 — End: 2021-03-14

## 2020-10-04 MED ORDER — CLONIDINE HCL 0.1 MG PO TABS
0.1000 mg | ORAL_TABLET | Freq: Two times a day (BID) | ORAL | 1 refills | Status: DC
Start: 2020-10-04 — End: 2021-03-19

## 2020-10-04 MED ORDER — AMLODIPINE BESYLATE 10 MG PO TABS
10.0000 mg | ORAL_TABLET | Freq: Every day | ORAL | 1 refills | Status: DC
Start: 2020-10-04 — End: 2021-03-19

## 2020-10-04 MED ORDER — BENAZEPRIL HCL 40 MG PO TABS
40.0000 mg | ORAL_TABLET | Freq: Every day | ORAL | 1 refills | Status: DC
Start: 2020-10-04 — End: 2021-04-03

## 2020-10-04 NOTE — Assessment & Plan Note (Signed)
Rechecking labs today. Await results. Treat as needed.  °

## 2020-10-04 NOTE — Assessment & Plan Note (Signed)
Under good control on current regimen. Continue current regimen. Continue to monitor. Call with any concerns. Refills given. Labs drawn today.   

## 2020-10-04 NOTE — Progress Notes (Signed)
BP 130/70 (BP Location: Left Arm)    Pulse 88    Temp 97.6 F (36.4 C)    Ht 5\' 1"  (1.549 m)    Wt 180 lb 3.2 oz (81.7 kg)    SpO2 97%    BMI 34.05 kg/m    Subjective:    Patient ID: Julia Nielsen, female    DOB: 1941-01-05, 79 y.o.   MRN: [id number]  HPI: Julia Nielsen is a 79 y.o. female presenting on 10/04/2020 for comprehensive medical examination. Current medical complaints include:  DIABETES Hypoglycemic episodes:no Polydipsia/polyuria: no Visual disturbance: no Chest pain: no Paresthesias: no Glucose Monitoring: yes  Accucheck frequency: Daily  Fasting glucose: 150s-160s Taking Insulin?: no Blood Pressure Monitoring: not checking Retinal Examination: Up to Date Foot Exam: Up to Date Diabetic Education: Completed Pneumovax: Up to Date Influenza: Up to Date Aspirin: no  HYPERTENSION / [place] Satisfied with current treatment? yes Duration of hypertension: chronic BP monitoring frequency: not checking BP medication side effects: no Past BP meds: amlodipine, benazepril, clonidine,carvedilol, lasix Duration of hyperlipidemia: chronic Cholesterol medication side effects: no Cholesterol supplements: none Past cholesterol medications: lovastatin Medication compliance: excellent compliance Aspirin: no Recent stressors: no Recurrent headaches: no Visual changes: no Palpitations: no Dyspnea: no Chest pain: no Lower extremity edema: no Dizzy/lightheaded: no  HYPOTHYROIDISM Thyroid control status:controlled Satisfied with current treatment? yes Medication side effects: no Medication compliance: excellent compliance Etiology of hypothyroidism:  Recent dose adjustment:no Fatigue: no Cold intolerance: no Heat intolerance: no Weight gain: no Weight loss: no Constipation: no Diarrhea/loose stools: no Palpitations: no Lower extremity edema: no Anxiety/depressed mood: no  Menopausal Symptoms: no  Depression Screen done today and results listed  below:  Depression screen [facility] 2/9 10/04/2020 07/27/2020 05/01/2020 09/16/2019 06/22/2019  Decreased Interest 0 0 0 0 0  Down, Depressed, Hopeless 0 0 0 0 0  PHQ - 2 Score 0 0 0 0 0  Altered sleeping - - 0 - 0  Tired, decreased energy - - 0 - 0  Change in appetite - - 0 - 0  Feeling bad or failure about yourself  - - 0 - 0  Trouble concentrating - - 0 - 0  Moving slowly or fidgety/restless - - 0 - 0  Suicidal thoughts - - 0 - 0  PHQ-9 Score - - 0 - 0  Difficult doing work/chores - - Not difficult at all - Not difficult at all    Past Medical History:  Past Medical History:  Diagnosis Date   Arthritis    OSTEO OF KNEE   Breast cancer ([place]) 2000   left breast cancer, radiation tx's.   CAD (coronary artery disease)    Carotid artery stenosis    bilateral   Cataract    Chronic kidney disease    Chronic stage 3   Coronary heart disease    Diabetes mellitus without complication (HCC)    Edema of both legs    GERD (gastroesophageal reflux disease)    Glaucoma    Hyperlipemia    mixed   Hyperlipidemia    Hypertension    Hypothyroidism    Mitral insufficiency    moderate   Osteopenia    Osteopenia    Personal history of radiation therapy 2000   LEFT lumpectomy   Stenosis of carotid artery    Thrombocytopenia (HCC)    Tricuspid insufficiency    moderate    Surgical History:  Past Surgical History:  Procedure Laterality Date   APPENDECTOMY  BLADDER SURGERY     BREAST EXCISIONAL BIOPSY Left 2000   breast ca   BREAST LUMPECTOMY Left    BREAST SURGERY     CARDIAC CATHETERIZATION     CARPAL TUNNEL RELEASE Bilateral    CHOLECYSTECTOMY     COLONOSCOPY     COLONOSCOPY WITH PROPOFOL N/A 08/12/2018   Procedure: COLONOSCOPY WITH PROPOFOL;  Surgeon: Manya Silvas, MD;  [facility] ENDOSCOPY;  Service: Endoscopy;  Laterality: N/A;   JOINT REPLACEMENT Right 2015   knee   parathyroid surgery     SHOULDER SURGERY Right    TUBAL  LIGATION      Medications:  Current Outpatient Medications on File Prior to Visit  Medication Sig   dorzolamide-timolol (COSOPT) 22.3-6.8 MG/ML ophthalmic solution Place 1 drop into both eyes 2 (two) times daily.    Dulaglutide (TRULICITY) 1.5 0000000 SOPN Inject 0.5 mLs (1.5 mg total) into the skin once a week.   fexofenadine (ALLEGRA) 180 MG tablet Take 180 mg by mouth daily.   fluticasone (FLONASE) 50 MCG/ACT nasal spray Place 2 sprays into both nostrils daily.   furosemide (LASIX) 20 MG tablet Take 20 mg by mouth daily.   glucose blood (ONETOUCH ULTRA) test strip 1 each by Other route as needed for other. Use as instructed   latanoprost (XALATAN) 0.005 % ophthalmic solution Place 1 drop into both eyes at bedtime.    levothyroxine (SYNTHROID) 150 MCG tablet Take 1 tablet (150 mcg total) by mouth daily before breakfast.   ONETOUCH DELICA LANCETS 99991111 MISC USE AS DIRECTED ONCE DAILY WITH TEST STRIPS E11.22   No current facility-administered medications on file prior to visit.    Allergies:  No Known Allergies  Social History:  Social History   Socioeconomic History   Marital status: Married    Spouse name: Not on file   Number of children: Not on file   Years of education: Not on file   Highest education level: High school graduate  Occupational History   Occupation: retired  Tobacco Use   Smoking status: Never Smoker   Smokeless tobacco: Never Used  Scientific laboratory technician Use: Never used  Substance and Sexual Activity   Alcohol use: No   Drug use: No   Sexual activity: Not on file  Other Topics Concern   Not on file  Social History Narrative   Not on file   Social Determinants of Health   Financial Resource Strain: Low Risk    Difficulty of Paying Living Expenses: Not hard at all  Food Insecurity: No Food Insecurity   Worried About Charity fundraiser in the Last Year: Never true   Arboriculturist in the Last Year: Never true   Transportation Needs: No Transportation Needs   Lack of Transportation (Medical): No   Lack of Transportation (Non-Medical): No  Physical Activity: Sufficiently Active   Days of Exercise per Week: 7 days   Minutes of Exercise per Session: 30 min  Stress: No Stress Concern Present   Feeling of Stress : Not at all  Social Connections: Not on file  Intimate Partner Violence: Not on file   Social History   Tobacco Use  Smoking Status Never Smoker  Smokeless Tobacco Never Used   Social History   Substance and Sexual Activity  Alcohol Use No    Family History:  Family History  Problem Relation Age of Onset   Gallbladder disease Mother    Aneurysm Mother    COPD Father  Congestive Heart Failure Father    Emphysema Father    Leukemia Sister    Stroke Brother    Dementia Brother    Alzheimer's disease Brother    Congestive Heart Failure Sister    Emphysema Sister    Breast cancer Neg Hx     Past medical history, surgical history, medications, allergies, family history and social history reviewed with patient today and changes made to appropriate areas of the chart.   Review of Systems  Constitutional: Negative.   HENT: Negative.   Eyes: Negative.   Respiratory: Negative.   Cardiovascular: Negative.   Gastrointestinal: Negative.   Genitourinary: Negative.   Musculoskeletal: Negative.   Skin: Negative.   Neurological: Negative.   Endo/Heme/Allergies: Positive for environmental allergies. Negative for polydipsia. Bruises/bleeds easily.  Psychiatric/Behavioral: Negative.     All other ROS negative except what is listed above and in the HPI.      Objective:    BP 130/70 (BP Location: Left Arm)    Pulse 88    Temp 97.6 F (36.4 C)    Ht 5\' 1"  (1.549 m)    Wt 180 lb 3.2 oz (81.7 kg)    SpO2 97%    BMI 34.05 kg/m   Wt Readings from Last 3 Encounters:  10/04/20 180 lb 3.2 oz (81.7 kg)  07/27/20 180 lb (81.6 kg)  07/13/20 180 lb (81.6 kg)     Physical Exam Vitals and nursing note reviewed.  Constitutional:      General: She is not in acute distress.    Appearance: Normal appearance. She is not ill-appearing, toxic-appearing or diaphoretic.  HENT:     Head: Normocephalic and atraumatic.     Right Ear: Tympanic membrane, ear canal and external ear normal. There is no impacted cerumen.     Left Ear: Tympanic membrane, ear canal and external ear normal. There is no impacted cerumen.     Nose: Nose normal. No congestion or rhinorrhea.     Mouth/Throat:     Mouth: Mucous membranes are moist.     Pharynx: Oropharynx is clear. No oropharyngeal exudate or posterior oropharyngeal erythema.  Eyes:     General: No scleral icterus.       Right eye: No discharge.        Left eye: No discharge.     Extraocular Movements: Extraocular movements intact.     Conjunctiva/sclera: Conjunctivae normal.     Pupils: Pupils are equal, round, and reactive to light.  Neck:     Vascular: No carotid bruit.  Cardiovascular:     Rate and Rhythm: Normal rate and regular rhythm.     Pulses: Normal pulses.     Heart sounds: No murmur heard. No friction rub. No gallop.   Pulmonary:     Effort: Pulmonary effort is normal. No respiratory distress.     Breath sounds: Normal breath sounds. No stridor. No wheezing, rhonchi or rales.  Chest:     Chest wall: No tenderness.  Abdominal:     General: Abdomen is flat. Bowel sounds are normal. There is no distension.     Palpations: Abdomen is soft. There is no mass.     Tenderness: There is no abdominal tenderness. There is no right CVA tenderness, left CVA tenderness, guarding or rebound.     Hernia: No hernia is present.  Genitourinary:    Comments: Breast and pelvic exams deferred with shared decision making Musculoskeletal:        General: No swelling, tenderness, deformity  or signs of injury.     Cervical back: Normal range of motion and neck supple. No rigidity. No muscular tenderness.     Right lower  leg: No edema.     Left lower leg: No edema.  Lymphadenopathy:     Cervical: No cervical adenopathy.  Skin:    General: Skin is warm and dry.     Capillary Refill: Capillary refill takes less than 2 seconds.     Coloration: Skin is not jaundiced or pale.     Findings: No bruising, erythema, lesion or rash.     Comments: Small well healing wound on R arm consistent with scratch  Neurological:     General: No focal deficit present.     Mental Status: She is alert and oriented to person, place, and time. Mental status is at baseline.     Cranial Nerves: No cranial nerve deficit.     Sensory: No sensory deficit.     Motor: No weakness.     Coordination: Coordination normal.     Gait: Gait normal.     Deep Tendon Reflexes: Reflexes normal.  Psychiatric:        Mood and Affect: Mood normal.        Behavior: Behavior normal.        Thought Content: Thought content normal.        Judgment: Judgment normal.     Results for orders placed or performed in visit on 10/04/20  Microscopic Examination   BLD  Result Value Ref Range   WBC, UA >30 (A) 0 - 5 /hpf   RBC 3-10 (A) 0 - 2 /hpf   Epithelial Cells (non renal) 0-10 0 - 10 /hpf   Bacteria, UA Many (A) None seen/Few  Bayer DCA Hb A1c Waived  Result Value Ref Range   HB A1C (BAYER DCA - WAIVED) 6.3 <7.0 %  Microalbumin, Urine Waived  Result Value Ref Range   Microalb, Ur Waived 80 (H) 0 - 19 mg/L   Creatinine, Urine Waived 200 10 - 300 mg/dL   Microalb/Creat Ratio 30-300 (H) <30 mg/g  Urinalysis, Routine w reflex microscopic  Result Value Ref Range   Specific Gravity, UA 1.010 1.005 - 1.030   pH, UA 5.0 5.0 - 7.5   Color, UA Yellow Yellow   Appearance Ur Hazy (A) Clear   Leukocytes,UA 2+ (A) Negative   Protein,UA 1+ (A) Negative/Trace   Glucose, UA Negative Negative   Ketones, UA Negative Negative   RBC, UA Trace (A) Negative   Bilirubin, UA Negative Negative   Urobilinogen, Ur 0.2 0.2 - 1.0 mg/dL   Nitrite, UA Negative  Negative   Microscopic Examination See below:       Assessment & Plan:   Problem List Items Addressed This Visit      Cardiovascular and Mediastinum   CAD (coronary artery disease)    Will keep BP and cholesterol and sugars under good control. Continue to monitor. Call with any concerns.       Relevant Medications   cloNIDine (CATAPRES) 0.1 MG tablet   lovastatin (MEVACOR) 40 MG tablet   carvedilol (COREG) 25 MG tablet   benazepril (LOTENSIN) 40 MG tablet   amLODipine (NORVASC) 10 MG tablet   Atherosclerosis of aorta (HCC)    Will keep BP and cholesterol and sugars under good control. Continue to monitor. Call with any concerns.       Relevant Medications   cloNIDine (CATAPRES) 0.1 MG tablet   lovastatin (MEVACOR)  40 MG tablet   carvedilol (COREG) 25 MG tablet   benazepril (LOTENSIN) 40 MG tablet   amLODipine (NORVASC) 10 MG tablet     Digestive   Cirrhosis of liver without ascites (HCC)    Checking labs today. Await results. Continue to monitor. Call with any concerns.         Endocrine   Hyperlipidemia associated with type 2 diabetes mellitus ([place])    Under good control on current regimen. Continue current regimen. Continue to monitor. Call with any concerns. Refills given. Labs drawn today.       Relevant Medications   lovastatin (MEVACOR) 40 MG tablet   benazepril (LOTENSIN) 40 MG tablet   Other Relevant Orders   CBC with Differential/Platelet   Comprehensive metabolic panel   Lipid Panel w/o Chol/HDL Ratio   Type 2 diabetes mellitus with stage 3 chronic kidney disease ([place])    Under good control with A1c of 6.3- continue current regimen. Continue to monitor. Call with any concerns. Refills up to date.       Relevant Medications   lovastatin (MEVACOR) 40 MG tablet   benazepril (LOTENSIN) 40 MG tablet   Other Relevant Orders   Bayer DCA Hb A1c Waived (Completed)   CBC with Differential/Platelet   Comprehensive metabolic panel   Microalbumin, Urine Waived  (Completed)   Urinalysis, Routine w reflex microscopic (Completed)   Hypothyroidism    Rechecking labs today. Await results. Treat as needed.       Relevant Medications   carvedilol (COREG) 25 MG tablet   Other Relevant Orders   CBC with Differential/Platelet   Comprehensive metabolic panel   TSH     Genitourinary   Benign hypertensive renal disease    Under good control on current regimen. Continue current regimen. Continue to monitor. Call with any concerns. Refills given. Labs drawn today.       Relevant Orders   CBC with Differential/Platelet   Comprehensive metabolic panel   Microalbumin, Urine Waived (Completed)   Urinalysis, Routine w reflex microscopic (Completed)   CKD (chronic kidney disease) stage 3, GFR 30-59 ml/min (HCC)    Rechecking labs today. Await results. Treat as needed.         Hematopoietic and Hemostatic   Idiopathic thrombocytopenia ([place])    Rechecking labs today. Await results. Treat as needed.        Other Visit Diagnoses    Routine general medical examination at a health care facility    -  Primary   Vaccines up to date. Screening labs checked today. DEXA, Mammo and colonoscopy up to date. Continue diet and exercise. Call with any concerns.   Pyuria       No symptoms, +Leuks- will check culture. Await results.    Relevant Orders   Urine Culture   Open wound of right upper arm, initial encounter       Healing well. Due for Td. Given today.       Follow up plan: Return in about 6 months (around 04/04/2021).   LABORATORY TESTING:  - Pap smear: not applicable  IMMUNIZATIONS:   - Tdap: Tetanus vaccination status reviewed: Td vaccination indicated and given today. - Influenza: Up to date - Pneumovax: Up to date - Prevnar: Up to date - COVID: Up to date- will bring in her card  SCREENING: -Mammogram: Up to date  - Colonoscopy: Up to date  - Bone Density: Up to date   PATIENT COUNSELING:   Advised to take 1 mg of  folate supplement  per day if capable of pregnancy.   Sexuality: Discussed sexually transmitted diseases, partner selection, use of condoms, avoidance of unintended pregnancy  and contraceptive alternatives.   Advised to avoid cigarette smoking.  I discussed with the patient that most people either abstain from alcohol or drink within safe limits (<=14/week and <=4 drinks/occasion for males, <=7/weeks and <= 3 drinks/occasion for females) and that the risk for alcohol disorders and other health effects rises proportionally with the number of drinks per week and how often a drinker exceeds daily limits.  Discussed cessation/primary prevention of drug use and availability of treatment for abuse.   Diet: Encouraged to adjust caloric intake to maintain  or achieve ideal body weight, to reduce intake of dietary saturated fat and total fat, to limit sodium intake by avoiding high sodium foods and not adding table salt, and to maintain adequate dietary potassium and calcium preferably from fresh fruits, vegetables, and low-fat dairy products.    stressed the importance of regular exercise  Injury prevention: Discussed safety belts, safety helmets, smoke detector, smoking near bedding or upholstery.   Dental health: Discussed importance of regular tooth brushing, flossing, and dental visits.    NEXT PREVENTATIVE PHYSICAL DUE IN 1 YEAR. Return in about 6 months (around 04/04/2021).

## 2020-10-04 NOTE — Assessment & Plan Note (Signed)
Under good control with A1c of 6.3- continue current regimen. Continue to monitor. Call with any concerns. Refills up to date.

## 2020-10-04 NOTE — Assessment & Plan Note (Signed)
Will keep BP and cholesterol and sugars under good control. Continue to monitor. Call with any concerns.  

## 2020-10-04 NOTE — Patient Instructions (Signed)
Health Maintenance After Age 81 After age 81, you are at a higher risk for certain long-term diseases and infections as well as injuries from falls. Falls are a major cause of broken bones and head injuries in people who are older than age 81. Getting regular preventive care can help to keep you healthy and well. Preventive care includes getting regular testing and making lifestyle changes as recommended by your health care provider. Talk with your health care provider about:  Which screenings and tests you should have. A screening is a test that checks for a disease when you have no symptoms.  A diet and exercise plan that is right for you. What should I know about screenings and tests to prevent falls? Screening and testing are the best ways to find a health problem early. Early diagnosis and treatment give you the best chance of managing medical conditions that are common after age 81. Certain conditions and lifestyle choices may make you more likely to have a fall. Your health care provider may recommend:  Regular vision checks. Poor vision and conditions such as cataracts can make you more likely to have a fall. If you wear glasses, make sure to get your prescription updated if your vision changes.  Medicine review. Work with your health care provider to regularly review all of the medicines you are taking, including over-the-counter medicines. Ask your health care provider about any side effects that may make you more likely to have a fall. Tell your health care provider if any medicines that you take make you feel dizzy or sleepy.  Osteoporosis screening. Osteoporosis is a condition that causes the bones to get weaker. This can make the bones weak and cause them to break more easily.  Blood pressure screening. Blood pressure changes and medicines to control blood pressure can make you feel dizzy.  Strength and balance checks. Your health care provider may recommend certain tests to check your  strength and balance while standing, walking, or changing positions.  Foot health exam. Foot pain and numbness, as well as not wearing proper footwear, can make you more likely to have a fall.  Depression screening. You may be more likely to have a fall if you have a fear of falling, feel emotionally low, or feel unable to do activities that you used to do.  Alcohol use screening. Using too much alcohol can affect your balance and may make you more likely to have a fall. What actions can I take to lower my risk of falls? General instructions  Talk with your health care provider about your risks for falling. Tell your health care provider if: ? You fall. Be sure to tell your health care provider about all falls, even ones that seem minor. ? You feel dizzy, sleepy, or off-balance.  Take over-the-counter and prescription medicines only as told by your health care provider. These include any supplements.  Eat a healthy diet and maintain a healthy weight. A healthy diet includes low-fat dairy products, low-fat (lean) meats, and fiber from whole grains, beans, and lots of fruits and vegetables. Home safety  Remove any tripping hazards, such as rugs, cords, and clutter.  Install safety equipment such as grab bars in bathrooms and safety rails on stairs.  Keep rooms and walkways well-lit. Activity   Follow a regular exercise program to stay fit. This will help you maintain your balance. Ask your health care provider what types of exercise are appropriate for you.  If you need a cane or   walker, use it as recommended by your health care provider.  Wear supportive shoes that have nonskid soles. Lifestyle  Do not drink alcohol if your health care provider tells you not to drink.  If you drink alcohol, limit how much you have: ? 0-1 drink a day for women. ? 0-2 drinks a day for men.  Be aware of how much alcohol is in your drink. In the [place] one drink equals one typical bottle of beer (12  oz), one-half glass of wine (5 oz), or one shot of hard liquor (1 oz).  Do not use any products that contain nicotine or tobacco, such as cigarettes and e-cigarettes. If you need help quitting, ask your health care provider. Summary  Having a healthy lifestyle and getting preventive care can help to protect your health and wellness after age 81.  Screening and testing are the best way to find a health problem early and help you avoid having a fall. Early diagnosis and treatment give you the best chance for managing medical conditions that are more common for people who are older than age 81.  Falls are a major cause of broken bones and head injuries in people who are older than age 81. Take precautions to prevent a fall at home.  Work with your health care provider to learn what changes you can make to improve your health and wellness and to prevent falls. This information is not intended to replace advice given to you by your health care provider. Make sure you discuss any questions you have with your health care provider. Document Revised: 01/21/2019 Document Reviewed: 08/13/2017 Elsevier Patient Education  2020 [employer]  

## 2020-10-04 NOTE — Assessment & Plan Note (Signed)
Checking labs today. Await results. Continue to monitor. Call with any concerns.

## 2020-10-05 LAB — COMPREHENSIVE METABOLIC PANEL
ALT: 28 IU/L (ref 0–32)
AST: 23 IU/L (ref 0–40)
Albumin/Globulin Ratio: 1.8 (ref 1.2–2.2)
Albumin: 4.2 g/dL (ref 3.6–4.6)
Alkaline Phosphatase: 51 IU/L (ref 44–121)
BUN/Creatinine Ratio: 14 (ref 12–28)
BUN: 18 mg/dL (ref 8–27)
Bilirubin Total: 0.8 mg/dL (ref 0.0–1.2)
CO2: 21 mmol/L (ref 20–29)
Calcium: 9.4 mg/dL (ref 8.7–10.3)
Chloride: 104 mmol/L (ref 96–106)
Creatinine, Ser: 1.26 mg/dL — ABNORMAL HIGH (ref 0.57–1.00)
GFR calc Af Amer: 46 mL/min/{1.73_m2} — ABNORMAL LOW (ref >59)
GFR calc non Af Amer: 40 mL/min/{1.73_m2} — ABNORMAL LOW (ref >59)
Globulin, Total: 2.3 g/dL (ref 1.5–4.5)
Glucose: 205 mg/dL — ABNORMAL HIGH (ref 65–99)
Potassium: 4.1 mmol/L (ref 3.5–5.2)
Sodium: 140 mmol/L (ref 134–144)
Total Protein: 6.5 g/dL (ref 6.0–8.5)

## 2020-10-05 LAB — CBC WITH DIFFERENTIAL/PLATELET
Basophils Absolute: 0 10*3/uL (ref 0.0–0.2)
Basos: 1 %
EOS (ABSOLUTE): 0.1 10*3/uL (ref 0.0–0.4)
Eos: 3 %
Hematocrit: 41.7 % (ref 34.0–46.6)
Hemoglobin: 13.4 g/dL (ref 11.1–15.9)
Immature Grans (Abs): 0 10*3/uL (ref 0.0–0.1)
Immature Granulocytes: 0 %
Lymphocytes Absolute: 1.2 10*3/uL (ref 0.7–3.1)
Lymphs: 30 %
MCH: 28.9 pg (ref 26.6–33.0)
MCHC: 32.1 g/dL (ref 31.5–35.7)
MCV: 90 fL (ref 79–97)
Monocytes Absolute: 0.4 10*3/uL (ref 0.1–0.9)
Monocytes: 9 %
Neutrophils Absolute: 2.3 10*3/uL (ref 1.4–7.0)
Neutrophils: 57 %
Platelets: 83 10*3/uL — CL (ref 150–450)
RBC: 4.64 x10E6/uL (ref 3.77–5.28)
RDW: 13.2 % (ref 11.7–15.4)
WBC: 4 10*3/uL (ref 3.4–10.8)

## 2020-10-05 LAB — LIPID PANEL W/O CHOL/HDL RATIO
Cholesterol, Total: 139 mg/dL (ref 100–199)
HDL: 50 mg/dL (ref >39)
LDL Chol Calc (NIH): 59 mg/dL (ref 0–99)
Triglycerides: 181 mg/dL — ABNORMAL HIGH (ref 0–149)
VLDL Cholesterol Cal: 30 mg/dL (ref 5–40)

## 2020-10-05 LAB — TSH: TSH: 2.06 u[IU]/mL (ref 0.450–4.500)

## 2020-10-10 ENCOUNTER — Other Ambulatory Visit: Payer: Self-pay | Admitting: Family Medicine

## 2020-10-10 LAB — URINE CULTURE

## 2020-10-10 MED ORDER — NITROFURANTOIN MONOHYD MACRO 100 MG PO CAPS: 100.0000 mg | ORAL_CAPSULE | Freq: Two times a day (BID) | ORAL | 0 refills | Status: DC

## 2020-10-10 NOTE — Progress Notes (Signed)
Patient notified

## 2020-10-11 ENCOUNTER — Ambulatory Visit: Payer: Self-pay | Admitting: Pharmacist

## 2020-10-11 DIAGNOSIS — I129 Hypertensive chronic kidney disease with stage 1 through stage 4 chronic kidney disease, or unspecified chronic kidney disease: Secondary | ICD-10-CM

## 2020-10-11 DIAGNOSIS — N183 Chronic kidney disease, stage 3 unspecified: Secondary | ICD-10-CM

## 2020-10-11 NOTE — Patient Instructions (Addendum)
Visit Information  It was a pleasure speaking with you today. Thank you for letting me be part of your clinical team. Please call with any questions or concerns.   Goals Addressed            This Visit's Progress   . Pharmacy Care Plan       CARE PLAN ENTRY (see longitudinal plan of care for additional care plan information)  Current Barriers:  . Chronic Disease Management support, education, and care coordination needs related to Hypertension, Hyperlipidemia, Diabetes, Coronary Artery Disease, Chronic Kidney Disease, Hypothyroidism, and Allergic Rhinitis   Hypertension/CKD BP Readings from Last 3 Encounters:  10/04/20 130/70  07/27/20 127/75  07/13/20 120/69   . Pharmacist Clinical Goal(s): o Over the next 90 days, patient will work with PharmD and providers to maintain BP goal <130/80 . Current regimen:  . Carvedilol 25 mg bid . Benazepril 40 mg qd . Amlodipine 10 mg qd . Furosemide 20 mg qd . Clonidine 0.1 mg bid  . Interventions: o Encouraged to check BP at home o Provided diet and exercise counseling. . Patient self care activities - Over the next 90 days, patient will: o Check BP weekly, document, and provide at future appointments o Ensure daily salt intake < 2300 mg/day  Hyperlipidemia Lab Results  Component Value Date/Time   LDLCALC 59 10/04/2020 08:20 AM   . Pharmacist Clinical Goal(s): o Over the next 90 days, patient will work with PharmD and providers to maintain LDL goal < 70 . Current regimen:  o Lovastatin 40 mg qd . Interventions: o The patient is asked to make an attempt to improve diet and exercise patterns to aid in medical management of this problem. . Patient self care activities - Over the next 90 days, patient will: o Take medication as prescribed  Diabetes Lab Results  Component Value Date/Time   HGBA1C 6.3 10/04/2020 08:17 AM   HGBA1C 6.3 04/03/2020 09:34 AM   . Pharmacist Clinical Goal(s): o Over the next 90 days, patient will work  with PharmD and providers to maintain A1c goal <7% . Current regimen:  o Trulicity 1.5 mg qweek . Interventions: o Patient assistance renewal faxed 10/09/20. Patient has 4 pens remaining o Reviewed goal glucose readings for an A1c of <7%, we want to see fasting sugars <130 and 2 hour after meal sugars <180.  o The patient is asked to make an attempt to improve diet and exercise patterns to aid in medical management of this problem. . Patient self care activities - Over the next 90 days, patient will: o Check blood sugar once daily, document, and provide at future appointments o Contact provider with any episodes of hypoglycemia   . Cost barriers: Will evaluate formulary for alternative to Flonase and Cosopt for lower copay  Medication management . Pharmacist Clinical Goal(s): o Over the next 90 days, patient will work with PharmD and providers to achieve optimal medication adherence . Current pharmacy: [facility] . Interventions o Comprehensive medication review performed. o Utilize [facility] for medication synchronization, packaging and delivery . Patient self care activities - Over the next 90 days, patient will: o Focus on medication adherence by fill dates o Take medications as prescribed o Report any questions or concerns to PharmD and/or provider(s)  Initial goal documentation \       The patient verbalized understanding of instructions, educational materials, and care plan provided today and agreed to receive a mailed copy of patient instructions, educational materials, and care plan.  Telephone follow up appointment with pharmacy team member scheduled for: 3 months  Junita Push. Cederic Mozley PharmD, BCPS Clinical Pharmacist [phone]  DASH Eating Plan DASH stands for "Dietary Approaches to Stop Hypertension." The DASH eating plan is a healthy eating plan that has been shown to reduce high blood pressure (hypertension). It may also reduce your risk for type 2 diabetes,  heart disease, and stroke. The DASH eating plan may also help with weight loss. What are tips for following this plan?  General guidelines  Avoid eating more than 2,300 mg (milligrams) of salt (sodium) a day. If you have hypertension, you may need to reduce your sodium intake to 1,500 mg a day.  Limit alcohol intake to no more than 1 drink a day for nonpregnant women and 2 drinks a day for men. One drink equals 12 oz of beer, 5 oz of wine, or 1 oz of hard liquor.  Work with your health care provider to maintain a healthy body weight or to lose weight. Ask what an ideal weight is for you.  Get at least 30 minutes of exercise that causes your heart to beat faster (aerobic exercise) most days of the week. Activities may include walking, swimming, or biking.  Work with your health care provider or diet and nutrition specialist (dietitian) to adjust your eating plan to your individual calorie needs. Reading food labels   Check food labels for the amount of sodium per serving. Choose foods with less than 5 percent of the Daily Value of sodium. Generally, foods with less than 300 mg of sodium per serving fit into this eating plan.  To find whole grains, look for the word "whole" as the first word in the ingredient list. Shopping  Buy products labeled as "low-sodium" or "no salt added."  Buy fresh foods. Avoid canned foods and premade or frozen meals. Cooking  Avoid adding salt when cooking. Use salt-free seasonings or herbs instead of table salt or sea salt. Check with your health care provider or pharmacist before using salt substitutes.  Do not fry foods. Cook foods using healthy methods such as baking, boiling, grilling, and broiling instead.  Cook with heart-healthy oils, such as olive, canola, soybean, or sunflower oil. Meal planning  Eat a balanced diet that includes: ? 5 or more servings of fruits and vegetables each day. At each meal, try to fill half of your plate with fruits  and vegetables. ? Up to 6-8 servings of whole grains each day. ? Less than 6 oz of lean meat, poultry, or fish each day. A 3-oz serving of meat is about the same size as a deck of cards. One egg equals 1 oz. ? 2 servings of low-fat dairy each day. ? A serving of nuts, seeds, or beans 5 times each week. ? Heart-healthy fats. Healthy fats called Omega-3 fatty acids are found in foods such as flaxseeds and coldwater fish, like sardines, salmon, and mackerel.  Limit how much you eat of the following: ? Canned or prepackaged foods. ? Food that is high in trans fat, such as fried foods. ? Food that is high in saturated fat, such as fatty meat. ? Sweets, desserts, sugary drinks, and other foods with added sugar. ? Full-fat dairy products.  Do not salt foods before eating.  Try to eat at least 2 vegetarian meals each week.  Eat more home-cooked food and less restaurant, buffet, and fast food.  When eating at a restaurant, ask that your food be prepared with less salt  or no salt, if possible. What foods are recommended? The items listed may not be a complete list. Talk with your dietitian about what dietary choices are best for you. Grains Whole-grain or whole-wheat bread. Whole-grain or whole-wheat pasta. Brown rice. [place]. Bulgur. Whole-grain and low-sodium cereals. Pita bread. Low-fat, low-sodium crackers. Whole-wheat flour tortillas. Vegetables Fresh or frozen vegetables (raw, steamed, roasted, or grilled). Low-sodium or reduced-sodium tomato and vegetable juice. Low-sodium or reduced-sodium tomato sauce and tomato paste. Low-sodium or reduced-sodium canned vegetables. Fruits All fresh, dried, or frozen fruit. Canned fruit in natural juice (without added sugar). Meat and other protein foods Skinless chicken or [place]. Ground chicken or [place]. Pork with fat trimmed off. Fish and seafood. Egg whites. Dried beans, peas, or lentils. Unsalted nuts, nut butters, and seeds. Unsalted  canned beans. Lean cuts of beef with fat trimmed off. Low-sodium, lean deli meat. Dairy Low-fat (1%) or fat-free (skim) milk. Fat-free, low-fat, or reduced-fat cheeses. Nonfat, low-sodium ricotta or cottage cheese. Low-fat or nonfat yogurt. Low-fat, low-sodium cheese. Fats and oils Soft margarine without trans fats. Vegetable oil. Low-fat, reduced-fat, or light mayonnaise and salad dressings (reduced-sodium). Canola, safflower, olive, soybean, and sunflower oils. Avocado. Seasoning and other foods Herbs. Spices. Seasoning mixes without salt. Unsalted popcorn and pretzels. Fat-free sweets. What foods are not recommended? The items listed may not be a complete list. Talk with your dietitian about what dietary choices are best for you. Grains Baked goods made with fat, such as croissants, muffins, or some breads. Dry pasta or rice meal packs. Vegetables Creamed or fried vegetables. Vegetables in a cheese sauce. Regular canned vegetables (not low-sodium or reduced-sodium). Regular canned tomato sauce and paste (not low-sodium or reduced-sodium). Regular tomato and vegetable juice (not low-sodium or reduced-sodium). Angie Fava. Olives. Fruits Canned fruit in a light or heavy syrup. Fried fruit. Fruit in cream or butter sauce. Meat and other protein foods Fatty cuts of meat. Ribs. Fried meat. Berniece Salines. Sausage. Bologna and other processed lunch meats. Salami. Fatback. Hotdogs. Bratwurst. Salted nuts and seeds. Canned beans with added salt. Canned or smoked fish. Whole eggs or egg yolks. Chicken or [place] with skin. Dairy Whole or 2% milk, cream, and half-and-half. Whole or full-fat cream cheese. Whole-fat or sweetened yogurt. Full-fat cheese. Nondairy creamers. Whipped toppings. Processed cheese and cheese spreads. Fats and oils Butter. Stick margarine. Lard. Shortening. Ghee. Bacon fat. Tropical oils, such as coconut, palm kernel, or palm oil. Seasoning and other foods Salted popcorn and pretzels. Onion  salt, garlic salt, seasoned salt, table salt, and sea salt. Worcestershire sauce. Tartar sauce. Barbecue sauce. Teriyaki sauce. Soy sauce, including reduced-sodium. Steak sauce. Canned and packaged gravies. Fish sauce. Oyster sauce. Cocktail sauce. Horseradish that you find on the shelf. Ketchup. Mustard. Meat flavorings and tenderizers. Bouillon cubes. Hot sauce and Tabasco sauce. Premade or packaged marinades. Premade or packaged taco seasonings. Relishes. Regular salad dressings. Where to find more information:  National Heart, Lung, and [place]: https://wilson-eaton.com/  American Heart Association: www.heart.org2 Summary  The DASH eating plan is a healthy eating plan that has been shown to reduce high blood pressure (hypertension). It may also reduce your risk for type 2 diabetes, heart disease, and stroke.  With the DASH eating plan, you should limit salt (sodium) intake to 2,300 mg a day. If you have hypertension, you may need to reduce your sodium intake to 1,500 mg a day.  When on the DASH eating plan, aim to eat more fresh fruits and vegetables, whole grains, lean proteins, low-fat dairy,  and heart-healthy fats.  Work with your health care provider or diet and nutrition specialist (dietitian) to adjust your eating plan to your individual calorie needs. This information is not intended to replace advice given to you by your health care provider. Make sure you discuss any questions you have with your health care provider. Document Revised: 09/12/2017 Document Reviewed: 09/23/2016 Elsevier Patient Education  2020 [employer].

## 2020-10-11 NOTE — Chronic Care Management (AMB) (Signed)
Chronic Care Management Pharmacy  Name: Julia Nielsen  MRN: [id number] DOB: 12/31/1940   Chief Complaint/ HPI  Julia Nielsen,  79 y.o. , female presents for her Follow-Up CCM visit with the clinical pharmacist via telephone.  PCP : Valerie Roys, DO Patient Care Team: Valerie Roys, DO as PCP - General (Family Medicine) Vladimir Faster, [facility] as Pharmacist (Pharmacist)  Patient's chronic conditions include: Hypertension, Hyperlipidemia, Diabetes, Coronary Artery Disease, Chronic Kidney Disease, Hypothyroidism and cirrhosis , thrombocytopenia  Office Visits: 10/04/20- Dr. Wynetta Emery- blood work, refills, UTI- started macrobid eGFR~40 asymptomatic--plt 83--saw hem/onc 2019 no txt unless plt <30     Objective: No Known Allergies  Medications: Outpatient Encounter Medications as of 10/11/2020  Medication Sig  . amLODipine (NORVASC) 10 MG tablet Take 1 tablet (10 mg total) by mouth daily.  . benazepril (LOTENSIN) 40 MG tablet Take 1 tablet (40 mg total) by mouth daily.  . Biotin w/ Vitamins C & E (HAIR/SKIN/NAILS PO) Take 1 tablet by mouth daily.  . carvedilol (COREG) 25 MG tablet Take 1 tablet (25 mg total) by mouth 2 (two) times daily with a meal.  . cloNIDine (CATAPRES) 0.1 MG tablet Take 1 tablet (0.1 mg total) by mouth 2 (two) times daily.  . dorzolamide-timolol (COSOPT) 22.3-6.8 MG/ML ophthalmic solution Place 1 drop into both eyes 2 (two) times daily.   . Dulaglutide (TRULICITY) 1.5 UY/4.0HK SOPN Inject 0.5 mLs (1.5 mg total) into the skin once a week.  . fexofenadine (ALLEGRA) 180 MG tablet Take 180 mg by mouth daily.  . fluticasone (FLONASE) 50 MCG/ACT nasal spray Place 2 sprays into both nostrils daily.  . furosemide (LASIX) 20 MG tablet Take 20 mg by mouth daily.  Marland Kitchen glucose blood (ONETOUCH ULTRA) test strip 1 each by Other route as needed for other. Use as instructed  . latanoprost (XALATAN) 0.005 % ophthalmic solution Place 1 drop into both eyes at bedtime.   Marland Kitchen  levothyroxine (SYNTHROID) 150 MCG tablet Take 1 tablet (150 mcg total) by mouth daily before breakfast.  . lovastatin (MEVACOR) 40 MG tablet Take 1 tablet (40 mg total) by mouth at bedtime.  . nitrofurantoin, macrocrystal-monohydrate, (MACROBID) 100 MG capsule Take 1 capsule (100 mg total) by mouth 2 (two) times daily.  [name] DELICA LANCETS 74Q MISC USE AS DIRECTED ONCE DAILY WITH TEST STRIPS E11.22   No facility-administered encounter medications on file as of 10/11/2020.    Wt Readings from Last 3 Encounters:  10/04/20 180 lb 3.2 oz (81.7 kg)  07/27/20 180 lb (81.6 kg)  07/13/20 180 lb (81.6 kg)    Lab Results  Component Value Date   CREATININE 1.26 (H) 10/04/2020   BUN 18 10/04/2020   GFRNONAA 40 (L) 10/04/2020   GFRAA 46 (L) 10/04/2020   NA 140 10/04/2020   K 4.1 10/04/2020   CALCIUM 9.4 10/04/2020   CO2 21 10/04/2020     Current Diagnosis/Assessment:    Goals Addressed            This Visit's Progress   . Pharmacy Care Plan       CARE PLAN ENTRY (see longitudinal plan of care for additional care plan information)  Current Barriers:  . Chronic Disease Management support, education, and care coordination needs related to Hypertension, Hyperlipidemia, Diabetes, Coronary Artery Disease, Chronic Kidney Disease, Hypothyroidism, and Allergic Rhinitis   Hypertension/CKD BP Readings from Last 3 Encounters:  10/04/20 130/70  07/27/20 127/75  07/13/20 120/69   . Pharmacist Clinical Goal(s):  o Over the next 90 days, patient will work with PharmD and providers to maintain BP goal <130/80 . Current regimen:  . Carvedilol 25 mg bid . Benazepril 40 mg qd . Amlodipine 10 mg qd . Furosemide 20 mg qd . Clonidine 0.1 mg bid  . Interventions: o Encouraged to check BP at home o Provided diet and exercise counseling. . Patient self care activities - Over the next 90 days, patient will: o Check BP weekly, document, and provide at future appointments o Ensure daily  salt intake < 2300 mg/day  Hyperlipidemia Lab Results  Component Value Date/Time   LDLCALC 59 10/04/2020 08:20 AM   . Pharmacist Clinical Goal(s): o Over the next 90 days, patient will work with PharmD and providers to maintain LDL goal < 70 . Current regimen:  o Lovastatin 40 mg qd . Interventions: o The patient is asked to make an attempt to improve diet and exercise patterns to aid in medical management of this problem. . Patient self care activities - Over the next 90 days, patient will: o Take medication as prescribed  Diabetes Lab Results  Component Value Date/Time   HGBA1C 6.3 10/04/2020 08:17 AM   HGBA1C 6.3 04/03/2020 09:34 AM   . Pharmacist Clinical Goal(s): o Over the next 90 days, patient will work with PharmD and providers to maintain A1c goal <7% . Current regimen:  o Trulicity 1.5 mg qweek . Interventions: o Patient assistance renewal faxed 10/09/20. Patient has 4 pens remaining o Reviewed goal glucose readings for an A1c of <7%, we want to see fasting sugars <130 and 2 hour after meal sugars <180.  o The patient is asked to make an attempt to improve diet and exercise patterns to aid in medical management of this problem. . Patient self care activities - Over the next 90 days, patient will: o Check blood sugar once daily, document, and provide at future appointments o Contact provider with any episodes of hypoglycemia   . Cost barriers: Will evaluate formulary for alternative to Flonase and Cosopt for lower copay  Medication management . Pharmacist Clinical Goal(s): o Over the next 90 days, patient will work with PharmD and providers to achieve optimal medication adherence . Current pharmacy: [facility] . Interventions o Comprehensive medication review performed. o Utilize [facility] pharmacy for medication synchronization, packaging and delivery . Patient self care activities - Over the next 90 days, patient will: o Focus on medication adherence by fill  dates o Take medications as prescribed o Report any questions or concerns to PharmD and/or provider(s)  Initial goal documentation \       Diabetes   A1c goal <7%  Recent Relevant Labs: Lab Results  Component Value Date/Time   HGBA1C 6.3 10/04/2020 08:17 AM   HGBA1C 6.3 04/03/2020 09:34 AM   MICROALBUR 80 (H) 10/04/2020 08:17 AM   MICROALBUR 30 (H) 04/20/2018 09:07 AM    Last diabetic Eye exam:  Lab Results  Component Value Date/Time   HMDIABEYEEXA No Retinopathy 07/03/2020 12:00 AM    Last diabetic Foot exam: No results found for: HMDIABFOOTEX   Checking BG: Daily  Recent FBG Readings: 182 this am- had half of chocolate muffin as before bed snack  Patient has failed these meds in past: NA Patient is currently controlled on the following medications: . Trulicity 1.5 mg q week  We discussed: Patient states her BG readings have been higher due to dietary indiscretions over the holidays. Patient assistance application faxed on 16/60/63. Patient has 4 pens remaining.  Recently started on macrobid for K pneumo UTI--tolerating well. Reviewed goal glucose readings for an A1c of <7%, we want to see fasting sugars <130 and 2 hour after meal sugars <180.  Reports pre- holiday BGs 120-150.   Plan  Continue current medications.  Hypertension/CKD/CAD   BP goal is:  <130/80  Office blood pressures are  BP Readings from Last 3 Encounters:  10/04/20 130/70  07/27/20 127/75  07/13/20 120/69   BMP Latest Ref Rng & Units 10/04/2020 12/20/2019 09/02/2019  Glucose 65 - 99 mg/dL 205(H) 208(H) -  BUN 8 - 27 mg/dL 18 19 -  Creatinine 0.57 - 1.00 mg/dL 1.26(H) 1.31(H) 1.30(H)  BUN/Creat Ratio 12 - $Re'28 14 15 'xQg$ -  Sodium 134 - 144 mmol/L 140 141 -  Potassium 3.5 - 5.2 mmol/L 4.1 4.1 -  Chloride 96 - 106 mmol/L 104 106 -  CO2 20 - 29 mmol/L 21 20 -  Calcium 8.7 - 10.3 mg/dL 9.4 8.9 -    Patient checks BP at home Not checking   Patient has failed these meds in the past: NA Patient  is currently controlled on the following medications:  . Carvedilol 25 mg bid . Benazepril 40 mg qd . Amlodipine 10 mg qd . Furosemide 20 mg qd . Clonidine 0.1 mg bid   We discussed : Has not checked BP since appointment last week and know her reading at that appointment. Reducing sodium in diet. Encouraged patient to check 1-2 times at home. Denies and hypotensive episodes.  Plan  Continue current medications     Hyperlipidemia/CAD/ Aortic Atherosclerosis   LDL goal < 70  Last lipids Lab Results  Component Value Date   CHOL 139 10/04/2020   HDL 50 10/04/2020   LDLCALC 59 10/04/2020   TRIG 181 (H) 10/04/2020   CHOLHDL 3.1 05/07/2019   Hepatic Function Latest Ref Rng & Units 10/04/2020 12/20/2019 05/07/2019  Total Protein 6.0 - 8.5 g/dL 6.5 6.6 6.7  Albumin 3.6 - 4.6 g/dL 4.2 4.1 4.3  AST 0 - 40 IU/L $Remov'23 28 25  'XUZIUm$ ALT 0 - 32 IU/L 28 34(H) 23  Alk Phosphatase 44 - 121 IU/L 51 53 49  Total Bilirubin 0.0 - 1.2 mg/dL 0.8 0.6 0.5     The ASCVD Risk score ([place]., et al., 2013) failed to calculate for the following reasons:   The 2013 ASCVD risk score is only valid for ages 47 to 41   Patient has failed these meds in past: NA Patient is currently controlled on the following medications:  . Lovastatin 40 mg qd  We discussed:  Diet and exercise.   Plan  Continue current medications and control with diet and exercise.   Vaccines   Reviewed and discussed patient's vaccination history.    Immunization History  Administered Date(s) Administered  . Fluad Quad(high Dose 65+) 06/22/2019, 07/13/2020  . Influenza, High Dose Seasonal PF 06/18/2016, 08/27/2017, 08/06/2018  . Influenza,inj,Quad PF,6+ Mos 07/05/2015  . Moderna Sars-Covid-2 Vaccination 02/29/2020, 03/28/2020  . Pneumococcal Conjugate-13 12/08/2014  . Pneumococcal-Unspecified 08/15/1999, 06/18/2005  . Td 01/23/2009, 10/04/2020    Plan  Patient is not interested in Shingrix at this time. Will continue to  educate and encourage.  allergies   Patient has failed these meds in past: NA Patient is currently controlled on the following medications:  . Flonase 2 sprays daily  We discussed:  Patient states copay is now $24/month due to tier change. Will look at formulary for alternative and collaborate with MD.  Plan  Continue current medications   Medication Management   Patient's preferred pharmacy is:  [place] [address]), [place]) [place] [address] Phone: [phone] Fax: [phone]  [facility] ([place]) [place], [place] [address] Phone: [phone] Fax: [phone]  [facility] [place], [place] [facility] AT [facility] [place] [address] Phone: [phone] Fax: [phone]  Uses pill box? Yes Pt endorses 95% compliance  We discussed: Patient is satisfied with pharmacy services  Plan  Continue current medication management strategy    Follow up: 3 month phone visit  Junita Push. Kenton Kingfisher PharmD, [facility] [phone]

## 2020-10-19 DIAGNOSIS — E782 Mixed hyperlipidemia: Secondary | ICD-10-CM | POA: Diagnosis not present

## 2020-10-19 DIAGNOSIS — I071 Rheumatic tricuspid insufficiency: Secondary | ICD-10-CM | POA: Diagnosis not present

## 2020-10-19 DIAGNOSIS — I6523 Occlusion and stenosis of bilateral carotid arteries: Secondary | ICD-10-CM | POA: Diagnosis not present

## 2020-10-19 DIAGNOSIS — I340 Nonrheumatic mitral (valve) insufficiency: Secondary | ICD-10-CM | POA: Diagnosis not present

## 2020-10-19 DIAGNOSIS — I10 Essential (primary) hypertension: Secondary | ICD-10-CM | POA: Diagnosis not present

## 2020-10-19 DIAGNOSIS — I2510 Atherosclerotic heart disease of native coronary artery without angina pectoris: Secondary | ICD-10-CM | POA: Diagnosis not present

## 2020-11-29 ENCOUNTER — Telehealth: Payer: Self-pay | Admitting: Pharmacist

## 2020-12-04 ENCOUNTER — Telehealth: Payer: Self-pay | Admitting: Family Medicine

## 2020-12-04 NOTE — Telephone Encounter (Signed)
Pt called and asked why her application for Trulicity through [facility] was sent in so late/ the app was due in January but the company let her know they just received app today and it will be 14 days before pt receives shipment / Pt wanted to know why the app wasn't sent to [facility] in December when she brought all the information in on 12.22.21/Pt states now she is running low and has 4 vyles left/ Pt is unpleased with this and asked to speak with Dr. Wynetta Emery Derrek [place]aco advise

## 2020-12-05 NOTE — Progress Notes (Addendum)
Chronic Care Management Pharmacy Assistant   Name: MUNA DEMERS  MRN: [id number] DOB: [birth year]  Reason for Encounter:  Patient Assistance Follow Up/ Trulicity   PCP : Valerie Roys, DO  Allergies:  No Known Allergies  Medications: Outpatient Encounter Medications as of 11/29/2020  Medication Sig   amLODipine (NORVASC) 10 MG tablet Take 1 tablet (10 mg total) by mouth daily.   benazepril (LOTENSIN) 40 MG tablet Take 1 tablet (40 mg total) by mouth daily.   Biotin w/ Vitamins C & E (HAIR/SKIN/NAILS PO) Take 1 tablet by mouth daily.   carvedilol (COREG) 25 MG tablet Take 1 tablet (25 mg total) by mouth 2 (two) times daily with a meal.   cloNIDine (CATAPRES) 0.1 MG tablet Take 1 tablet (0.1 mg total) by mouth 2 (two) times daily.   dorzolamide-timolol (COSOPT) 22.3-6.8 MG/ML ophthalmic solution Place 1 drop into both eyes 2 (two) times daily.    Dulaglutide (TRULICITY) 1.5 ZT/2.4PY SOPN Inject 0.5 mLs (1.5 mg total) into the skin once a week.   fexofenadine (ALLEGRA) 180 MG tablet Take 180 mg by mouth daily.   fluticasone (FLONASE) 50 MCG/ACT nasal spray Place 2 sprays into both nostrils daily.   furosemide (LASIX) 20 MG tablet Take 20 mg by mouth daily.   glucose blood (ONETOUCH ULTRA) test strip 1 each by Other route as needed for other. Use as instructed   latanoprost (XALATAN) 0.005 % ophthalmic solution Place 1 drop into both eyes at bedtime.    levothyroxine (SYNTHROID) 150 MCG tablet Take 1 tablet (150 mcg total) by mouth daily before breakfast.   lovastatin (MEVACOR) 40 MG tablet Take 1 tablet (40 mg total) by mouth at bedtime.   nitrofurantoin, macrocrystal-monohydrate, (MACROBID) 100 MG capsule Take 1 capsule (100 mg total) by mouth 2 (two) times daily.   ONETOUCH DELICA LANCETS 09X MISC USE AS DIRECTED ONCE DAILY WITH TEST STRIPS E11.22   No facility-administered encounter medications on file as of 11/29/2020.    Current Diagnosis: Patient Active Problem List    Diagnosis Date Noted   Atherosclerosis of aorta ([place]) 09/16/2019   Cirrhosis of liver without ascites ([place]) 09/16/2019   Renal cyst 09/16/2019   Osteoarthritis of knee 05/27/2018   Advanced care planning/counseling discussion 12/02/2017   Idiopathic thrombocytopenia ([place]) 02/10/2017   Bilateral leg edema 06/06/2016   Hypothyroidism 12/11/2015   Osteopenia 12/11/2015   Multiple respiratory allergies 12/11/2015   CAD (coronary artery disease) 07/05/2015   Type 2 diabetes mellitus with stage 3 chronic kidney disease ([place]) 07/05/2015   Benign hypertensive renal disease 07/05/2015   Secondary hyperparathyroidism of renal origin ([place]) 07/05/2015   Anemia of chronic renal failure 07/05/2015   Hyperlipidemia associated with type 2 diabetes mellitus ([place]) 03/27/2015   Bilateral carotid artery stenosis 02/09/2015   CKD (chronic kidney disease) stage 3, GFR 30-59 ml/min (HCC) 10/11/2014   Moderate mitral insufficiency 10/11/2014   Moderate tricuspid insufficiency 10/11/2014    The patient requested assistance with the cost of her medication Trulicity. Patient assistance application for Trulicity was prefilled on 10-03-2020.  On 10-03-20 the patient was made aware that her prefilled application was ready for her signature and copy of proof of income. During this conversation, the patient reported she would be at the office for an appointment on 10-04-20 and she would sign the application and provide proof of income.   On 10-09-2020 the patient's application was faxed to [facility] with a return confirmation sheet.  10-20-2019: Received a fax from  [facility] requesting an RX date to be provided on the application and prescription.   10-24-2019: Requested information was provided and faxed back to [facility]. Received a confirmation sheet.    11-29-2020: The patient called explaining she has not heard or received an update on her application for her Trulicity. She explained she was down to 4 pens  and was becoming concerned. She requested for [place] to call [facility] and to check the status of her application and shipment.  Called [facility] and spoke to a representative named Tia who stated " the patient's application is showing "pending". The representative could not give me any other details and instructed me to call back the next business day to see if the system had "updated" the patient's information.   I called Mrs. Detamore back and gave her an update and the information I was given. She verbalized understanding.   11-30-2020: [place] again who explained they could not find any application on file for the patient. I gave the dates above. The representative, Tamela,  noted the patient's account and instructed me to call back the next day.   12-01-2020: Received an incoming call from the patient requesting a status follow-up. I verbalized understanding and explained to the patient I would follow back up with her as soon as I had some new information. She verbalized understanding.   12-01-2020: Followed back up with [facility] who explained they had no records of any communication or applications for this patient. Again dates were given and the representative stated " I'm sorry I do not see anything, but it could mean her application is in process." Called Mrs. Feltman back and explained the representative stated her application was in "process". Explained I would follow back up with [facility] the next business day. Again she verbalized understanding. Also, the patient's application was re-faxed to [facility] to ensure they did have her application on file.   12-02-2020: Followed back up with [facility] who explained they only had one application on file that was received on 12-01-2020. I gave the other dates above and no communication or faxes could be found for the patient. I requested for this application to be marked as urgent. The representative explained she would mark the application as  urgent, but they do not provide expedited requests. Also, explained that application approval can take up to 5-7 business days. Made PharmD Birdena Crandall aware.     Cloretta Ned, LPN Clinical Pharmacist Assistant  [phone]   Follow-Up:  Pharmacist Review  I have reviewed the care management and care coordination activities outlined in this encounter and I am certifying that I agree with the content of this note.  Junita Push. Kenton Kingfisher PharmD, [place] [phone]

## 2020-12-05 NOTE — Telephone Encounter (Signed)
Application faxed 57/50/51 (patient signed prescriber section and had to be redone), 10/19/20 per Ralph Leyden Cares request due to missing date on prescription page and again on 12/01/20. Confirmations received each time.

## 2020-12-05 NOTE — Telephone Encounter (Signed)
Called pt and relayed Julie's information pt verbalized understanding but said she should have been inform previously

## 2020-12-27 ENCOUNTER — Ambulatory Visit (INDEPENDENT_AMBULATORY_CARE_PROVIDER_SITE_OTHER): Payer: PPO | Admitting: Pharmacist

## 2020-12-27 ENCOUNTER — Other Ambulatory Visit: Payer: Self-pay | Admitting: Nurse Practitioner

## 2020-12-27 ENCOUNTER — Telehealth: Payer: Self-pay | Admitting: Pharmacist

## 2020-12-27 DIAGNOSIS — I129 Hypertensive chronic kidney disease with stage 1 through stage 4 chronic kidney disease, or unspecified chronic kidney disease: Secondary | ICD-10-CM | POA: Diagnosis not present

## 2020-12-27 DIAGNOSIS — E1122 Type 2 diabetes mellitus with diabetic chronic kidney disease: Secondary | ICD-10-CM

## 2020-12-27 DIAGNOSIS — I2510 Atherosclerotic heart disease of native coronary artery without angina pectoris: Secondary | ICD-10-CM

## 2020-12-27 DIAGNOSIS — N183 Chronic kidney disease, stage 3 unspecified: Secondary | ICD-10-CM

## 2020-12-27 MED ORDER — ONETOUCH ULTRA VI STRP: 1.0000 | ORAL_STRIP | 11 refills | Status: DC | PRN

## 2020-12-27 MED ORDER — CARVEDILOL 25 MG PO TABS: 25.0000 mg | ORAL_TABLET | Freq: Two times a day (BID) | ORAL | 1 refills | Status: DC

## 2020-12-27 NOTE — Chronic Care Management (AMB) (Signed)
    Chronic Care Management Pharmacy Assistant   Name: Julia Nielsen  MRN: [id number] DOB: 05-11-1941  Reason for Encounter: Medication Review   Medications: Outpatient Encounter Medications as of 12/27/2020  Medication Sig  . amLODipine (NORVASC) 10 MG tablet Take 1 tablet (10 mg total) by mouth daily.  . benazepril (LOTENSIN) 40 MG tablet Take 1 tablet (40 mg total) by mouth daily.  . Biotin w/ Vitamins C & E (HAIR/SKIN/NAILS PO) Take 1 tablet by mouth daily.  . brimonidine (ALPHAGAN) 0.2 % ophthalmic solution 1 drop 2 (two) times daily.  . carvedilol (COREG) 25 MG tablet Take 1 tablet (25 mg total) by mouth 2 (two) times daily with a meal.  . cloNIDine (CATAPRES) 0.1 MG tablet Take 1 tablet (0.1 mg total) by mouth 2 (two) times daily.  . dorzolamide-timolol (COSOPT) 22.3-6.8 MG/ML ophthalmic solution Place 1 drop into both eyes 2 (two) times daily.   . Dulaglutide (TRULICITY) 1.5 OI/7.1IW SOPN Inject 0.5 mLs (1.5 mg total) into the skin once a week.  . fexofenadine (ALLEGRA) 180 MG tablet Take 180 mg by mouth daily.  . fluticasone (FLONASE) 50 MCG/ACT nasal spray Place 2 sprays into both nostrils daily.  . furosemide (LASIX) 20 MG tablet Take 20 mg by mouth daily.  Marland Kitchen glucose blood (ONETOUCH ULTRA) test strip 1 each by Other route as needed for other. Use as instructed  . latanoprost (XALATAN) 0.005 % ophthalmic solution Place 1 drop into both eyes at bedtime.   Marland Kitchen levothyroxine (SYNTHROID) 150 MCG tablet Take 1 tablet (150 mcg total) by mouth daily before breakfast.  . lovastatin (MEVACOR) 40 MG tablet Take 1 tablet (40 mg total) by mouth at bedtime.  . nitrofurantoin, macrocrystal-monohydrate, (MACROBID) 100 MG capsule Take 1 capsule (100 mg total) by mouth 2 (two) times daily.  [name] DELICA LANCETS 58K MISC USE AS DIRECTED ONCE DAILY WITH TEST STRIPS E11.22   No facility-administered encounter medications on file as of 12/27/2020.    BP Readings from Last 3 Encounters:   10/04/20 130/70  07/27/20 127/75  07/13/20 120/69    Lab Results  Component Value Date   HGBA1C 6.3 10/04/2020     Patient onboarding form is complete.  All prescriptions have been transferred from [facility].   Patient wishes to obtain medications through  [facility]  90 Days   Called patient and reviewed medications and coordinated delivery.  This delivery to include: Brimonidine OP 0.2% sol; one drop two times daily Latanoprost 0.005% OP sol; one drop into both eyes at bedtime  Patient declines the following medications as they are not due for refills at this time: Amlodipine 10 mg; one tablet at breakfast Benazepril 40 mg; one tablet at breakfast Carvedilol 25 mg; one tablet at breakfast and one tablet with evening meal Lasix 20 mg; one tablet at breakfast Levothyroxine 150 mcg; one tablet before breakfast Lovastatin 40 mg; one tablet with evening meal Dorzolamide/Timolol sol 22.3-6.8 mg/mL; one drop into both eyes two times daily   ** Patient needs refills on Carvedilol from her PCP - CPP to request.   Lasix 20 mg - requested from Dr. Juleen China Latanoprost 0.005% OP sol - requested from Dr. Sandra Cockayne  Confirmed delivery date of 12/29/2020, advised patient that pharmacy will contact them the morning of delivery.   April D Calhoun, [place] Pharmacist Assistant [phone]

## 2020-12-27 NOTE — Progress Notes (Signed)
 Chronic Care Management Pharmacy Note  01/09/2021 Name:  Julia Nielsen MRN:  [id number] DOB:  01/15/1941  Subjective: Julia Nielsen is an 79 y.o. year old female who is a primary patient of Johnson, Megan P, DO.  The CCM team was consulted for assistance with disease management and care coordination needs.    Engaged with patient by telephone for follow up visit in response to provider referral for pharmacy case management and/or care coordination services.   Consent to Services:  The patient was given information about Chronic Care Management services, agreed to services, and gave verbal consent prior to initiation of services.  Please see initial visit note for detailed documentation.   Patient Care Team: Johnson, Megan P, DO as PCP - General (Family Medicine) Harris, Julie S, RPH as Pharmacist (Pharmacist)  Recent office visits: 10/04/20- Johnson(PCP)- blood work, UTI- klebsiella pneumo, macrobid 100 mg bid,  eGFr ~ 40 ml ?min  Recent consult visits: 10/19/20- Kowalski (cards)- stress test, ekg lvef 70%  Hospital visits: None in previous 6 months  Objective:  Lab Results  Component Value Date   CREATININE 1.26 (H) 10/04/2020   BUN 18 10/04/2020   GFRNONAA 40 (L) 10/04/2020   GFRAA 46 (L) 10/04/2020   NA 140 10/04/2020   K 4.1 10/04/2020   CALCIUM 9.4 10/04/2020   CO2 21 10/04/2020    Lab Results  Component Value Date/Time   HGBA1C 6.3 10/04/2020 08:17 AM   HGBA1C 6.3 04/03/2020 09:34 AM   MICROALBUR 80 (H) 10/04/2020 08:17 AM   MICROALBUR 30 (H) 04/20/2018 09:07 AM    Last diabetic Eye exam:  Lab Results  Component Value Date/Time   HMDIABEYEEXA No Retinopathy 07/03/2020 12:00 AM    Last diabetic Foot exam: No results found for: HMDIABFOOTEX   Lab Results  Component Value Date   CHOL 139 10/04/2020   HDL 50 10/04/2020   LDLCALC 59 10/04/2020   TRIG 181 (H) 10/04/2020   CHOLHDL 3.1 05/07/2019    Hepatic Function Latest Ref Rng & Units 10/04/2020  12/20/2019 05/07/2019  Total Protein 6.0 - 8.5 g/dL 6.5 6.6 6.7  Albumin 3.6 - 4.6 g/dL 4.2 4.1 4.3  AST 0 - 40 IU/L 23 28 25  ALT 0 - 32 IU/L 28 34(H) 23  Alk Phosphatase 44 - 121 IU/L 51 53 49  Total Bilirubin 0.0 - 1.2 mg/dL 0.8 0.6 0.5    Lab Results  Component Value Date/Time   TSH 2.060 10/04/2020 08:20 AM   TSH 3.540 06/10/2019 08:50 AM    CBC Latest Ref Rng & Units 10/04/2020 05/07/2019 08/12/2018  WBC 3.4 - 10.8 x10E3/uL 4.0 4.1 6.1  Hemoglobin 11.1 - 15.9 g/dL 13.4 13.2 13.8  Hematocrit 34.0 - 46.6 % 41.7 40.6 42.7  Platelets 150 - 450 x10E3/uL 83(LL) 82(LL) 101(L)    No results found for: VD25OH  Clinical ASCVD: Yes  The ASCVD Risk score (Goff DC Jr., et al., 2013) failed to calculate for the following reasons:   The 2013 ASCVD risk score is only valid for ages 40 to 79    Depression screen PHQ 2/9 10/04/2020 07/27/2020 05/01/2020  Decreased Interest 0 0 0  Down, Depressed, Hopeless 0 0 0  PHQ - 2 Score 0 0 0  Altered sleeping - - 0  Tired, decreased energy - - 0  Change in appetite - - 0  Feeling bad or failure about yourself  - - 0  Trouble concentrating - - 0  Moving slowly or fidgety/restless - -   0  Suicidal thoughts - - 0  PHQ-9 Score - - 0  Difficult doing work/chores - - Not difficult at all       Social History   Tobacco Use  Smoking Status Never Smoker  Smokeless Tobacco Never Used   BP Readings from Last 3 Encounters:  10/04/20 130/70  07/27/20 127/75  07/13/20 120/69   Pulse Readings from Last 3 Encounters:  10/04/20 88  07/27/20 84  07/13/20 84   Wt Readings from Last 3 Encounters:  10/04/20 180 lb 3.2 oz (81.7 kg)  07/27/20 180 lb (81.6 kg)  07/13/20 180 lb (81.6 kg)    Assessment/Interventions: Review of patient past medical history, allergies, medications, health status, including review of consultants reports, laboratory and other test data, was performed as part of comprehensive evaluation and provision of chronic care  management services.   SDOH:  (Social Determinants of Health) assessments and interventions performed: No   CCM Care Plan  No Known Allergies  Medications Reviewed Today    Reviewed by Vladimir Faster, [facility] (Pharmacist) on 01/09/21 at 1301  Med List Status: <None>  Medication Order Taking? Sig Documenting Provider Last Dose Status Informant  amLODipine (NORVASC) 10 MG tablet [id number] Yes Take 1 tablet (10 mg total) by mouth daily. Johnson, Megan P, DO Taking Active   benazepril (LOTENSIN) 40 MG tablet [id number] Yes Take 1 tablet (40 mg total) by mouth daily. Park Liter P, DO Taking Active   Biotin w/ Vitamins C & E (HAIR/SKIN/NAILS PO) [id number] Yes Take 1 tablet by mouth daily. [provider] Taking Active   brimonidine (ALPHAGAN) 0.2 % ophthalmic solution [id number] Yes 1 drop 2 (two) times daily. [provider] Taking Active   carvedilol (COREG) 25 MG tablet [id number] Yes Take 1 tablet (25 mg total) by mouth 2 (two) times daily with a meal. Cannady, Jolene T, NP Taking Active   cloNIDine (CATAPRES) 0.1 MG tablet [id number] Yes Take 1 tablet (0.1 mg total) by mouth 2 (two) times daily. Johnson, Megan P, DO Taking Active   dorzolamide-timolol (COSOPT) 22.3-6.8 MG/ML ophthalmic solution [id number] Yes Place 1 drop into both eyes 2 (two) times daily.  [provider] Taking Active   Dulaglutide (TRULICITY) 1.5 RN/1.6FB SOPN [id number] Yes Inject 0.5 mLs (1.5 mg total) into the skin once a week. Johnson, Megan P, DO Taking Active   fexofenadine (ALLEGRA) 180 MG tablet [id number] Yes Take 180 mg by mouth daily. [provider] Taking Active   fluticasone (FLONASE) 50 MCG/ACT nasal spray [id number] Yes Place 2 sprays into both nostrils daily. Johnson, Megan P, DO Taking Active   furosemide (LASIX) 20 MG tablet [id number]  Take 20 mg by mouth daily. [provider]  Active   glucose blood (ONETOUCH ULTRA) test strip [id number]  1 each by Other route as  needed for other. Use as instructed Cannady, Jolene T, NP  Active   latanoprost (XALATAN) 0.005 % ophthalmic solution [id number] Yes Place 1 drop into both eyes at bedtime.  [provider] Taking Active   levothyroxine (SYNTHROID) 150 MCG tablet [id number] Yes Take 1 tablet (150 mcg total) by mouth daily before breakfast. Park Liter P, DO Taking Active   lovastatin (MEVACOR) 40 MG tablet [id number] Yes Take 1 tablet (40 mg total) by mouth at bedtime. Park Liter P, DO Taking Active   nitrofurantoin, macrocrystal-monohydrate, (MACROBID) 100 MG capsule [id number] Yes Take 1 capsule (100 mg total) by mouth 2 (two) times daily. Valerie Roys, DO Taking Active  ONETOUCH DELICA LANCETS 33G MISC [id number] Yes USE AS DIRECTED ONCE DAILY WITH TEST STRIPS E11.22 Crissman, Mark A, MD Taking Active           Patient Active Problem List   Diagnosis Date Noted  . Atherosclerosis of aorta (HCC) 09/16/2019  . Cirrhosis of liver without ascites (HCC) 09/16/2019  . Renal cyst 09/16/2019  . Osteoarthritis of knee 05/27/2018  . Advanced care planning/counseling discussion 12/02/2017  . Idiopathic thrombocytopenia (HCC) 02/10/2017  . Bilateral leg edema 06/06/2016  . Hypothyroidism 12/11/2015  . Osteopenia 12/11/2015  . Multiple respiratory allergies 12/11/2015  . CAD (coronary artery disease) 07/05/2015  . Type 2 diabetes mellitus with stage 3 chronic kidney disease (HCC) 07/05/2015  . Benign hypertensive renal disease 07/05/2015  . Secondary hyperparathyroidism of renal origin (HCC) 07/05/2015  . Anemia of chronic renal failure 07/05/2015  . Hyperlipidemia associated with type 2 diabetes mellitus (HCC) 03/27/2015  . Bilateral carotid artery stenosis 02/09/2015  . CKD (chronic kidney disease) stage 3, GFR 30-59 ml/min (HCC) 10/11/2014  . Moderate mitral insufficiency 10/11/2014  . Moderate tricuspid insufficiency 10/11/2014    Immunization History  Administered Date(s) Administered   . Fluad Quad(high Dose 65+) 06/22/2019, 07/13/2020  . Influenza, High Dose Seasonal PF 06/18/2016, 08/27/2017, 08/06/2018  . Influenza,inj,Quad PF,6+ Mos 07/05/2015  . Moderna Sars-Covid-2 Vaccination 02/29/2020, 03/28/2020  . Pneumococcal Conjugate-13 12/08/2014  . Pneumococcal-Unspecified 08/15/1999, 06/18/2005  . Td 01/23/2009, 10/04/2020    Conditions to be addressed/monitored:  Hyperlipidemia, Diabetes, Coronary Artery Disease, Chronic Kidney Disease, Hypothyroidism, Osteopenia and idiopathic thrombocytopenia  Care Plan : CCm Pharmacy Care Plan  Updates made by Damien Batty S, RPH since 01/09/2021 12:00 AM    Problem: HTN, CKD3, CAD, HLD, DM@, hypothyroidism, Secondary parahyperthyroidism, osteopenia   Priority: High    Long-Range Goal: Disease Management   Start Date: 01/09/2021  This Visit's Progress: On track  Priority: High  Note:   Current Barriers:  . Unable to independently afford treatment regimen . Unable to independently monitor therapeutic efficacy . Unable to maintain control of blood pressure .   Pharmacist Clinical Goal(s):  . Patient will verbalize ability to afford treatment regimen . achieve adherence to monitoring guidelines and medication adherence to achieve therapeutic efficacy . maintain control of diabetes as evidenced by a1c  . adhere to prescribed medication regimen as evidenced by lab values through collaboration with PharmD and provider.    Interventions: . 1:1 collaboration with Johnson, Megan P, DO regarding development and update of comprehensive plan of care as evidenced by provider attestation and co-signature . Inter-disciplinary care team collaboration (see longitudinal plan of care) . Comprehensive medication review performed; medication list updated in electronic medical record  Hypertension (BP goal <130/80) -Controlled -Current treatment: . Carvedilol 25 mg bid . Amlodipine 10 mg qd . Benazepril 40 mg qd . Clonidine 0.1 mg  bid . Furosemide 20 mg qd -Medications previously tried: NA -Current home readings: doesn't check often -Denies hypotensive/hypertensive symptoms -Educated on BP goals and benefits of medications for prevention of heart attack, stroke and kidney damage; Daily salt intake goal < 2300 mg; Exercise goal of 150 minutes per week; Importance of home blood pressure monitoring; -Counseled to monitor BP at home daily, document, and provide log at future appointments -Counseled on diet and exercise extensively Recommended to continue current medication   Lab Results  Component Value Date   LDLCALC 59 10/04/2020    Hyperlipidemia: (LDL goal < 70) -Controlled -Current treatment: . Lovastatin 40 mg qd -Medications previously tried:   NA  --Educated on Cholesterol goals;  Benefits of statin for ASCVD risk reduction; Exercise goal of 150 minutes per week; -Counseled on diet and exercise extensively Recommended to continue current medication   Lab Results  Component Value Date   HGBA1C 6.3 10/04/2020    Diabetes (A1c goal <7%) -Controlled -Current medications: . Trulicity 1.5 mg q week -Medications previously tried: NA -Current home glucose readings . fasting glucose: <130 . post prandial glucose: <180 -Denies hypoglycemic/hyperglycemic symptoms -Current exercise: Walks some -Educated on Exercise goal of 150 minutes per week; Prevention and management of hypoglycemic episodes; Benefits of routine self-monitoring of blood sugar; -Counseled to check feet daily and get yearly eye exams -Recommended to continue current medication Assessed patient finances. patient receives patient assistance for Trulicily    Patient Goals/Self-Care Activities . Patient will:  - take medications as prescribed check glucose daily, document, and provide at future appointments check blood pressure 2- 3 times daily, document, and provide at future appointments target a minimum of 150 minutes of  moderate intensity exercise weekly  Follow Up Plan: Telephone follow up appointment with care management team member scheduled for: 3 months          Medication Assistance: Trulicityobtained through [employer] medication assistance program.  Enrollment ends 10/13/21  Patient's preferred pharmacy is:  [facility] - [place], [place] - [address] [place] [address] Phone: [phone] Fax: [phone]  [id number] - [place], [place] - [address] [place] [address] Phone: [phone] Fax: [phone]  [facility] - [place], [place] - [address]  [place] [address] Phone: [phone] Fax: [phone]  Uses pill box? Yes Pt endorses 95% compliance  We discussed: Benefits of medication synchronization, packaging and delivery as well as enhanced pharmacist oversight with [facility]. Patient decided to: Utilize [facility] pharmacy for medication synchronization, packaging and delivery  Care Plan and Follow Up Patient Decision:  Patient agrees to Care Plan and Follow-up.  Plan: Telephone follow up appointment with care management team member scheduled for:  3 months  Julie S. Harris PharmD, BCPS Clinical Pharmacist [facility] [phone]    

## 2021-01-01 DIAGNOSIS — H401132 Primary open-angle glaucoma, bilateral, moderate stage: Secondary | ICD-10-CM | POA: Diagnosis not present

## 2021-01-09 ENCOUNTER — Telehealth: Payer: Self-pay | Admitting: Pharmacist

## 2021-01-09 NOTE — Patient Instructions (Addendum)
Visit Information  It was a pleasure speaking with you today. Thank you for letting me be part of your clinical team. Please call with any questions or concerns.   Goals Addressed            This Visit's Progress   . Monitor and Manage My Blood Sugar-Diabetes Type 2       Timeframe:  Long-Range Goal Priority:  High Start Date:                             Expected End Date:                       Follow Up Date  63month folllow up    - check blood sugar at prescribed times - check blood sugar if I feel it is too high or too low - enter blood sugar readings and medication or insulin into daily log - take the blood sugar log to all doctor visits - take the blood sugar meter to all doctor visits    Why is this important?    Checking your blood sugar at home helps to keep it from getting very high or very low.   Writing the results in a diary or log helps the doctor know how to care for you.   Your blood sugar log should have the time, date and the results.   Also, write down the amount of insulin or other medicine that you take.   Other information, like what you ate, exercise done and how you were feeling, will also be helpful.     Notes:     . Track and Manage My Blood Pressure-Hypertension       Timeframe:  Long-Range Goal Priority:  High Start Date:                             Expected End Date:                       Follow Up Date # month follow up - check blood pressure 3 times per week - write blood pressure results in a log or diary    Why is this important?    You won't feel high blood pressure, but it can still hurt your blood vessels.   High blood pressure can cause heart or kidney problems. It can also cause a stroke.   Making lifestyle changes like losing a little weight or eating less salt will help.   Checking your blood pressure at home and at different times of the day can help to control blood pressure.   If the doctor prescribes medicine remember  to take it the way the doctor ordered.   Call the office if you cannot afford the medicine or if there are questions about it.     Notes:        The patient verbalized understanding of instructions, educational materials, and care plan provided today and agreed to receive a mailed copy of patient instructions, educational materials, and care plan.   Telephone follow up appointment with pharmacy team member scheduled for: PharmD 3 months, CPA monthly  Junita Push. [place] PharmD, BCPS Clinical Pharmacist [phone]  Preventing Diabetes Mellitus Complications You can help to prevent or slow down problems that are caused by diabetes (diabetes mellitus). Following your diabetes plan and taking care of  yourself can reduce your risk of serious or life-threatening complications. What actions can I take to prevent diabetes complications? Diabetes management  Follow instructions from your health care providers about managing your diabetes. Your diabetes may be managed by a team of health care providers who can teach you how to care for yourself and can answer questions that you have.  Educate yourself about your condition so you can make healthy choices about eating and physical activity.  Know your target range for your blood sugar (glucose), and check your blood glucose level as often as told. Your health care provider will help you decide how often to check your blood glucose level depending on your treatment goals and how well you are meeting them.  Ask your health care provider if you should take low-dose aspirin daily and what dose is recommended for you. Taking low-dose aspirin daily is recommended to help prevent cardiovascular disease.   Controlling your blood pressure and cholesterol Your personal target blood pressure is determined based on:  Your age.  Your medicines.  How long you have had diabetes.  Any other medical conditions you have. To control your blood  pressure:  Follow instructions from your health care provider about meal planning, exercise, and medicines.  Make sure your health care provider checks your blood pressure at every medical visit.  Monitor your blood pressure at home as told by your health care provider. To control your cholesterol:  Follow instructions from your health care provider about meal planning, exercise, and medicines.  Have your cholesterol checked at least once a year.  You may be prescribed medicine to lower cholesterol (statin). If you are not taking a statin, ask your health care provider if you should be. Controlling your cholesterol may:  Help prevent heart disease and stroke. These are the most common health problems for people with diabetes.  Improve your blood flow.   Medical appointments and vaccines Schedule and keep yearly physical exams and eye exams. Your health care provider will tell you how often you need medical visits depending on your diabetes management plan. Keep all follow-up visits as told. This is important so possible problems can be identified early and complications can be avoided or treated.  Every visit with your health care provider should include measuring your: ? Weight. ? Blood pressure. ? Blood glucose control.  Your A1C (hemoglobin A1C) level should be checked: ? At least 2 times a year, if you are meeting your treatment goals. ? 4 times a year, if you are not meeting treatment goals or if your treatment goals have changed.  Your blood lipids (lipid profile) should be checked yearly. You should also be checked yearly for protein in your urine (urine microalbumin).  If you have type 1 diabetes, get an eye exam 3-5 years after you are diagnosed, and then once a year after your first exam.  If you have type 2 diabetes, get an eye exam as soon as you are diagnosed, and then once a year after your first exam. It is also important to keep your vaccines current. It is  recommended that you receive:  A flu (influenza) vaccine every year.  A pneumonia (pneumococcal) vaccine and a hepatitis B vaccine. If you are age 97 or older, you may get the pneumonia vaccine as a series of two separate shots. Ask your health care provider which other vaccines may be recommended. Lifestyle  Do not use any products that contain nicotine or tobacco, such as cigarettes, e-cigarettes, and  chewing tobacco. If you need help quitting, ask your health care provider. By avoiding nicotine and tobacco: ? You will lower your risk for heart attack, stroke, nerve disease, and kidney disease. ? Your cholesterol and blood pressure may improve. ? Your blood circulation will improve.  If you drink alcohol: ? Limit how much you use to:  0-1 drink a day for women who are not pregnant.  0-2 drinks a day for men. ? Be aware of how much alcohol is in your drink. In the [place] one drink equals one 12 oz bottle of beer (355 mL), one 5 oz glass of wine (148 mL), or one 11?2 oz glass of hard liquor (44 mL). Taking care of your feet Diabetes may cause you to have poor blood circulation to your legs and feet. Because of this, taking care of your feet is very important. Diabetes can cause:  The skin on the feet to get thinner, break more easily, and heal more slowly.  Nerve damage in your legs and feet, which results in decreased feeling. You may not notice minor injuries that could lead to serious problems. To avoid foot problems:  Check your skin and feet every day for cuts, bruises, redness, blisters, or sores.  Schedule a foot exam with your health care provider once every year. This exam includes: ? Inspecting the structure and skin of your feet. ? Checking the pulses and sensation in your feet.  Make sure that your health care provider performs a visual foot exam at every medical visit.   Taking care of your teeth People with poorly controlled diabetes are more likely to have gum  (periodontal) disease. Diabetes can make periodontal diseases harder to control. If not treated, periodontal diseases can lead to tooth loss. To prevent this:  Brush your teeth twice a day.  Floss at least once a day.  Visit your dentist 2 times a year. Managing stress Living with diabetes can be stressful. When you are experiencing stress, your blood glucose may be affected in two ways:  Stress hormones may cause your blood glucose to rise.  You may be distracted from taking good care of yourself. Be aware of your stress level and make changes to help you manage challenging situations. To lower your stress levels:  Consider joining a support group.  Do planned relaxation or meditation.  Do a hobby that you enjoy.  Maintain healthy relationships.  Exercise regularly.  Work with your health care provider or a mental health professional. Where to find more information  American Diabetes Association: www.diabetes.org  Association of Diabetes Care and Education Specialists: www.diabeteseducator.org Summary  You can take action to prevent or slow down problems that are caused by diabetes (diabetes mellitus). Following your diabetes plan and taking care of yourself can reduce your risk of serious or life-threatening complications.  Follow instructions from your health care providers about managing your diabetes. Your diabetes may be managed by a team of health care providers who can teach you how to care for yourself and can answer questions that you have.  Know your target range for your blood sugar (glucose), and check your blood glucose levels as often as told. Your health care provider will help you decide how often you should check your blood glucose level depending on your treatment goals and how well you are meeting them.  Your health care provider will tell you how often you need medical visits depending on your diabetes management plan. Keep all follow-up visits as directed.  This is important so possible problems can be identified early and complications can be avoided or treated. This information is not intended to replace advice given to you by your health care provider. Make sure you discuss any questions you have with your health care provider. Document Revised: 11/19/2019 Document Reviewed: 11/19/2019 Elsevier Patient Education  2021 [employer].

## 2021-01-09 NOTE — Chronic Care Management (AMB) (Signed)
    Chronic Care Management Pharmacy Assistant   Name: Julia Nielsen  MRN: [id number] DOB: [birth year]  Reason for Encounter: Refill Request    Medications: Outpatient Encounter Medications as of 01/09/2021  Medication Sig  . amLODipine (NORVASC) 10 MG tablet Take 1 tablet (10 mg total) by mouth daily.  . benazepril (LOTENSIN) 40 MG tablet Take 1 tablet (40 mg total) by mouth daily.  . Biotin w/ Vitamins C & E (HAIR/SKIN/NAILS PO) Take 1 tablet by mouth daily.  . brimonidine (ALPHAGAN) 0.2 % ophthalmic solution 1 drop 2 (two) times daily.  . carvedilol (COREG) 25 MG tablet Take 1 tablet (25 mg total) by mouth 2 (two) times daily with a meal.  . cloNIDine (CATAPRES) 0.1 MG tablet Take 1 tablet (0.1 mg total) by mouth 2 (two) times daily.  . dorzolamide-timolol (COSOPT) 22.3-6.8 MG/ML ophthalmic solution Place 1 drop into both eyes 2 (two) times daily.   . Dulaglutide (TRULICITY) 1.5 IF/0.2DX SOPN Inject 0.5 mLs (1.5 mg total) into the skin once a week.  . fexofenadine (ALLEGRA) 180 MG tablet Take 180 mg by mouth daily.  . fluticasone (FLONASE) 50 MCG/ACT nasal spray Place 2 sprays into both nostrils daily.  . furosemide (LASIX) 20 MG tablet Take 20 mg by mouth daily.  Marland Kitchen glucose blood (ONETOUCH ULTRA) test strip 1 each by Other route as needed for other. Use as instructed  . latanoprost (XALATAN) 0.005 % ophthalmic solution Place 1 drop into both eyes at bedtime.   Marland Kitchen levothyroxine (SYNTHROID) 150 MCG tablet Take 1 tablet (150 mcg total) by mouth daily before breakfast.  . lovastatin (MEVACOR) 40 MG tablet Take 1 tablet (40 mg total) by mouth at bedtime.  . nitrofurantoin, macrocrystal-monohydrate, (MACROBID) 100 MG capsule Take 1 capsule (100 mg total) by mouth 2 (two) times daily.  [name] DELICA LANCETS 41O MISC USE AS DIRECTED ONCE DAILY WITH TEST STRIPS E11.22   No facility-administered encounter medications on file as of 01/09/2021.     I called and left a message on the nurse  line to request refills for medication Furosemide 20 mg tablets for the patients upcoming delivery at [place] on Thursday .01/12/2021.  April D Calhoun, [place] [facility] Pharmacist Assistant [phone]

## 2021-01-15 ENCOUNTER — Telehealth: Payer: Self-pay

## 2021-01-15 NOTE — Progress Notes (Signed)
Chronic Care Management Pharmacy Assistant   Name: Julia Nielsen  MRN: [id number] DOB: 1941-04-17  Reason for Encounter: Disease State- Diabetes Mellitus Disease State Call   Recent office visits:  No visits noted   Recent consult visits:  No visits noted  Hospital visits:  None in previous 6 months  Medications: Outpatient Encounter Medications as of 01/15/2021  Medication Sig  . amLODipine (NORVASC) 10 MG tablet Take 1 tablet (10 mg total) by mouth daily.  . benazepril (LOTENSIN) 40 MG tablet Take 1 tablet (40 mg total) by mouth daily.  . Biotin w/ Vitamins C & E (HAIR/SKIN/NAILS PO) Take 1 tablet by mouth daily.  . brimonidine (ALPHAGAN) 0.2 % ophthalmic solution 1 drop 2 (two) times daily.  . carvedilol (COREG) 25 MG tablet Take 1 tablet (25 mg total) by mouth 2 (two) times daily with a meal.  . cloNIDine (CATAPRES) 0.1 MG tablet Take 1 tablet (0.1 mg total) by mouth 2 (two) times daily.  . dorzolamide-timolol (COSOPT) 22.3-6.8 MG/ML ophthalmic solution Place 1 drop into both eyes 2 (two) times daily.   . Dulaglutide (TRULICITY) 1.5 XN/1.7GY SOPN Inject 0.5 mLs (1.5 mg total) into the skin once a week.  . fexofenadine (ALLEGRA) 180 MG tablet Take 180 mg by mouth daily.  . fluticasone (FLONASE) 50 MCG/ACT nasal spray Place 2 sprays into both nostrils daily.  . furosemide (LASIX) 20 MG tablet Take 20 mg by mouth daily.  Marland Kitchen glucose blood (ONETOUCH ULTRA) test strip 1 each by Other route as needed for other. Use as instructed  . latanoprost (XALATAN) 0.005 % ophthalmic solution Place 1 drop into both eyes at bedtime.   Marland Kitchen levothyroxine (SYNTHROID) 150 MCG tablet Take 1 tablet (150 mcg total) by mouth daily before breakfast.  . lovastatin (MEVACOR) 40 MG tablet Take 1 tablet (40 mg total) by mouth at bedtime.  . nitrofurantoin, macrocrystal-monohydrate, (MACROBID) 100 MG capsule Take 1 capsule (100 mg total) by mouth 2 (two) times daily.  [name] DELICA LANCETS 17C MISC USE AS  DIRECTED ONCE DAILY WITH TEST STRIPS E11.22   No facility-administered encounter medications on file as of 01/15/2021.   Recent Relevant Labs: Lab Results  Component Value Date/Time   HGBA1C 6.3 10/04/2020 08:17 AM   HGBA1C 6.3 04/03/2020 09:34 AM   MICROALBUR 80 (H) 10/04/2020 08:17 AM   MICROALBUR 30 (H) 04/20/2018 09:07 AM    Kidney Function Lab Results  Component Value Date/Time   CREATININE 1.26 (H) 10/04/2020 08:20 AM   CREATININE 1.31 (H) 12/20/2019 08:51 AM   CREATININE 1.32 (H) 06/02/2014 04:28 AM   CREATININE 1.38 (H) 06/01/2014 05:05 AM   GFRNONAA 40 (L) 10/04/2020 08:20 AM   GFRNONAA 39 (L) 06/02/2014 04:28 AM   GFRAA 46 (L) 10/04/2020 08:20 AM   GFRAA 46 (L) 06/02/2014 04:28 AM    Current antihyperglycemic regimen:   Dulaglutide (Trulicity ) 1.5mg /0.82ml - inject 1.5mg  weekly  What recent interventions/DTPs have been made to improve glycemic control:  No recent interventions noted    Have there been any recent hospitalizations or ED visits since last visit with CPP? No   Patient denies hypoglycemic symptoms, including Pale, Sweaty, Shaky, Hungry, Nervous/irritable and Vision changes   Patient denies hyperglycemic symptoms, including blurry vision, excessive thirst, fatigue, polyuria and weakness   How often are you checking your blood sugar? Patient states that she checks her blood sugars once daily and before breakfast   What are your blood sugars ranging?  o Fasting: around  140-144  o Before meals:  o After meals:  o Bedtime:  During the week, how often does your blood glucose drop below 70? Never   Are you checking your feet daily/regularly?  Patient states she regularly checks her feet and they are doing well.  Adherence Review: Is the patient currently on a STATIN medication? Yes  Lovastatin 40mg - take one tab at bedtime  Is the patient currently on ACE/ARB medication? Yes  Benzapril 40mg - take one tab daily   Does the patient have >5 day gap  between last estimated fill dates? No  Star Rating Drugs: Benazepril 40mg - 90DS last filled 11/10/20 Lovastatin 40mg - 90DS last filled 11/14/2020 Dulaglutide 1.5mg /0.66ml Patient receives patient assistance through [employer], [place]

## 2021-02-05 ENCOUNTER — Other Ambulatory Visit: Payer: Self-pay | Admitting: Nephrology

## 2021-02-05 ENCOUNTER — Other Ambulatory Visit (HOSPITAL_COMMUNITY): Payer: Self-pay | Admitting: Nephrology

## 2021-02-05 DIAGNOSIS — E876 Hypokalemia: Secondary | ICD-10-CM

## 2021-02-05 DIAGNOSIS — N2889 Other specified disorders of kidney and ureter: Secondary | ICD-10-CM | POA: Diagnosis not present

## 2021-02-05 DIAGNOSIS — I129 Hypertensive chronic kidney disease with stage 1 through stage 4 chronic kidney disease, or unspecified chronic kidney disease: Secondary | ICD-10-CM | POA: Diagnosis not present

## 2021-02-05 DIAGNOSIS — N1832 Chronic kidney disease, stage 3b: Secondary | ICD-10-CM

## 2021-02-05 DIAGNOSIS — E1122 Type 2 diabetes mellitus with diabetic chronic kidney disease: Secondary | ICD-10-CM

## 2021-02-05 DIAGNOSIS — D631 Anemia in chronic kidney disease: Secondary | ICD-10-CM | POA: Diagnosis not present

## 2021-02-05 DIAGNOSIS — N1831 Chronic kidney disease, stage 3a: Secondary | ICD-10-CM | POA: Diagnosis not present

## 2021-02-05 DIAGNOSIS — N2581 Secondary hyperparathyroidism of renal origin: Secondary | ICD-10-CM | POA: Diagnosis not present

## 2021-02-05 DIAGNOSIS — R809 Proteinuria, unspecified: Secondary | ICD-10-CM | POA: Diagnosis not present

## 2021-02-08 ENCOUNTER — Telehealth: Payer: Self-pay

## 2021-02-08 NOTE — Chronic Care Management (AMB) (Signed)
    Chronic Care Management Pharmacy Assistant   Name: Julia Nielsen  MRN: [id number] DOB: 1941-03-26   Reason for Encounter: Patient Assistance Application       Medications: Outpatient Encounter Medications as of 02/08/2021  Medication Sig  . amLODipine (NORVASC) 10 MG tablet Take 1 tablet (10 mg total) by mouth daily.  . benazepril (LOTENSIN) 40 MG tablet Take 1 tablet (40 mg total) by mouth daily.  . Biotin w/ Vitamins C & E (HAIR/SKIN/NAILS PO) Take 1 tablet by mouth daily.  . brimonidine (ALPHAGAN) 0.2 % ophthalmic solution 1 drop 2 (two) times daily.  . carvedilol (COREG) 25 MG tablet Take 1 tablet (25 mg total) by mouth 2 (two) times daily with a meal.  . cloNIDine (CATAPRES) 0.1 MG tablet Take 1 tablet (0.1 mg total) by mouth 2 (two) times daily.  . dorzolamide-timolol (COSOPT) 22.3-6.8 MG/ML ophthalmic solution Place 1 drop into both eyes 2 (two) times daily.   . Dulaglutide (TRULICITY) 1.5 JJ/9.4RD SOPN Inject 0.5 mLs (1.5 mg total) into the skin once a week.  . fexofenadine (ALLEGRA) 180 MG tablet Take 180 mg by mouth daily.  . fluticasone (FLONASE) 50 MCG/ACT nasal spray Place 2 sprays into both nostrils daily.  . furosemide (LASIX) 20 MG tablet Take 20 mg by mouth daily.  Marland Kitchen glucose blood (ONETOUCH ULTRA) test strip 1 each by Other route as needed for other. Use as instructed  . latanoprost (XALATAN) 0.005 % ophthalmic solution Place 1 drop into both eyes at bedtime.   Marland Kitchen levothyroxine (SYNTHROID) 150 MCG tablet Take 1 tablet (150 mcg total) by mouth daily before breakfast.  . lovastatin (MEVACOR) 40 MG tablet Take 1 tablet (40 mg total) by mouth at bedtime.  . nitrofurantoin, macrocrystal-monohydrate, (MACROBID) 100 MG capsule Take 1 capsule (100 mg total) by mouth 2 (two) times daily.  [name] DELICA LANCETS 40C MISC USE AS DIRECTED ONCE DAILY WITH TEST STRIPS E11.22   No facility-administered encounter medications on file as of 02/08/2021.   Called [employer] [facility]  regarding patients assistance application for Trulicity. Patient is approved from 12/04/20-10/13/21.  Lizbeth Bark Clinical Pharmacist Assistant [phone]

## 2021-02-13 ENCOUNTER — Other Ambulatory Visit: Payer: Self-pay

## 2021-02-13 ENCOUNTER — Ambulatory Visit (HOSPITAL_COMMUNITY): Payer: PPO

## 2021-02-13 ENCOUNTER — Ambulatory Visit
Admission: RE | Admit: 2021-02-13 | Discharge: 2021-02-13 | Disposition: A | Discharge status: Home / Self Care | Payer: PPO | Source: Ambulatory Visit | Attending: Nephrology | Admitting: Nephrology

## 2021-02-13 DIAGNOSIS — N2889 Other specified disorders of kidney and ureter: Secondary | ICD-10-CM

## 2021-02-13 DIAGNOSIS — N183 Chronic kidney disease, stage 3 unspecified: Secondary | ICD-10-CM | POA: Diagnosis not present

## 2021-02-13 DIAGNOSIS — N1832 Chronic kidney disease, stage 3b: Secondary | ICD-10-CM | POA: Diagnosis not present

## 2021-02-13 DIAGNOSIS — E876 Hypokalemia: Secondary | ICD-10-CM

## 2021-02-13 DIAGNOSIS — E1122 Type 2 diabetes mellitus with diabetic chronic kidney disease: Secondary | ICD-10-CM | POA: Diagnosis not present

## 2021-02-13 DIAGNOSIS — I129 Hypertensive chronic kidney disease with stage 1 through stage 4 chronic kidney disease, or unspecified chronic kidney disease: Secondary | ICD-10-CM | POA: Diagnosis not present

## 2021-02-13 DIAGNOSIS — N281 Cyst of kidney, acquired: Secondary | ICD-10-CM | POA: Diagnosis not present

## 2021-02-20 ENCOUNTER — Telehealth: Payer: Self-pay | Admitting: Pharmacist

## 2021-02-20 NOTE — Chronic Care Management (AMB) (Signed)
Chronic Care Management Pharmacy Assistant   Name: Julia Nielsen  MRN: [id number] DOB: 1941-01-05   Reason for Encounter: Disease State-Hypertension    Recent office visits:  None noted  Recent consult visits:  02/05/21- Lurena Nida Kolluru (Nephrology)follow up  Hospital visits:  None in previous 6 months  Medications: Outpatient Encounter Medications as of 02/20/2021  Medication Sig  . amLODipine (NORVASC) 10 MG tablet Take 1 tablet (10 mg total) by mouth daily.  . benazepril (LOTENSIN) 40 MG tablet Take 1 tablet (40 mg total) by mouth daily.  . Biotin w/ Vitamins C & E (HAIR/SKIN/NAILS PO) Take 1 tablet by mouth daily.  . brimonidine (ALPHAGAN) 0.2 % ophthalmic solution 1 drop 2 (two) times daily.  . carvedilol (COREG) 25 MG tablet Take 1 tablet (25 mg total) by mouth 2 (two) times daily with a meal.  . cloNIDine (CATAPRES) 0.1 MG tablet Take 1 tablet (0.1 mg total) by mouth 2 (two) times daily.  . dorzolamide-timolol (COSOPT) 22.3-6.8 MG/ML ophthalmic solution Place 1 drop into both eyes 2 (two) times daily.   . Dulaglutide (TRULICITY) 1.5 QQ/5.9DG SOPN Inject 0.5 mLs (1.5 mg total) into the skin once a week.  . fexofenadine (ALLEGRA) 180 MG tablet Take 180 mg by mouth daily.  . fluticasone (FLONASE) 50 MCG/ACT nasal spray Place 2 sprays into both nostrils daily.  . furosemide (LASIX) 20 MG tablet Take 20 mg by mouth daily.  Marland Kitchen glucose blood (ONETOUCH ULTRA) test strip 1 each by Other route as needed for other. Use as instructed  . latanoprost (XALATAN) 0.005 % ophthalmic solution Place 1 drop into both eyes at bedtime.   Marland Kitchen levothyroxine (SYNTHROID) 150 MCG tablet Take 1 tablet (150 mcg total) by mouth daily before breakfast.  . lovastatin (MEVACOR) 40 MG tablet Take 1 tablet (40 mg total) by mouth at bedtime.  . nitrofurantoin, macrocrystal-monohydrate, (MACROBID) 100 MG capsule Take 1 capsule (100 mg total) by mouth 2 (two) times daily.  [name] DELICA LANCETS 38V MISC USE  AS DIRECTED ONCE DAILY WITH TEST STRIPS E11.22   No facility-administered encounter medications on file as of 02/20/2021.    Reviewed chart prior to disease state call. Spoke with patient regarding BP  Recent Office Vitals: BP Readings from Last 3 Encounters:  10/04/20 130/70  07/27/20 127/75  07/13/20 120/69   Pulse Readings from Last 3 Encounters:  10/04/20 88  07/27/20 84  07/13/20 84    Wt Readings from Last 3 Encounters:  10/04/20 180 lb 3.2 oz (81.7 kg)  07/27/20 180 lb (81.6 kg)  07/13/20 180 lb (81.6 kg)     Kidney Function Lab Results  Component Value Date/Time   CREATININE 1.26 (H) 10/04/2020 08:20 AM   CREATININE 1.31 (H) 12/20/2019 08:51 AM   CREATININE 1.32 (H) 06/02/2014 04:28 AM   CREATININE 1.38 (H) 06/01/2014 05:05 AM   GFRNONAA 40 (L) 10/04/2020 08:20 AM   GFRNONAA 39 (L) 06/02/2014 04:28 AM   GFRAA 46 (L) 10/04/2020 08:20 AM   GFRAA 46 (L) 06/02/2014 04:28 AM    BMP Latest Ref Rng & Units 10/04/2020 12/20/2019 09/02/2019  Glucose 65 - 99 mg/dL 205(H) 208(H) -  BUN 8 - 27 mg/dL 18 19 -  Creatinine 0.57 - 1.00 mg/dL 1.26(H) 1.31(H) 1.30(H)  BUN/Creat Ratio 12 - 28 14 15  -  Sodium 134 - 144 mmol/L 140 141 -  Potassium 3.5 - 5.2 mmol/L 4.1 4.1 -  Chloride 96 - 106 mmol/L 104 106 -  CO2 20 -  29 mmol/L 21 20 -  Calcium 8.7 - 10.3 mg/dL 9.4 8.9 -    . Current antihypertensive regimen:   Carvedilol 25 mg bid  Amlodipine 10 mg qd  Benazepril 40 mg qd  Clonidine 0.1 mg bid  Furosemide 20 mg qd  . How often are you checking your Blood Pressure? weekly   . Current home BP readings: 136/65 02/14/21   . What recent interventions/DTPs have been made by any provider to improve Blood Pressure control since last CPP Visit: None noted  . Any recent hospitalizations or ED visits since last visit with CPP? No   . What diet changes have been made to improve Blood Pressure Control?  o Patient states she avoids salt all together.  . What exercise is  being done to improve your Blood Pressure Control?  o Patient states she uses a pedal machine daily.  Adherence Review: Is the patient currently on ACE/ARB medication? Yes Does the patient have >5 day gap between last estimated fill dates? Yes   Patient states her daughter has been in the hospital since march and has had some higher readings.  Star Rating Drugs: Benazepril 40 mg last filled 17/49/44 56 DS Trulicity 1.5 mg last filled 04/03/21 84 DS Lovastatin 40 mg last filled 02/06/21 42 DS   [facility] Clinical Pharmacist Assistant [phone]

## 2021-03-14 ENCOUNTER — Telehealth: Payer: Self-pay | Admitting: Pharmacist

## 2021-03-14 ENCOUNTER — Other Ambulatory Visit: Payer: Self-pay

## 2021-03-14 NOTE — Telephone Encounter (Signed)
Patient has future appointment scheduled 04/03/21

## 2021-03-14 NOTE — Chronic Care Management (AMB) (Signed)
    Chronic Care Management Pharmacy Assistant   Name: Julia Nielsen  MRN: [id number] DOB: 1941/06/15  Reason for Encounter: Medication Review Medication coordination call  Medications: Outpatient Encounter Medications as of 03/14/2021  Medication Sig  . amLODipine (NORVASC) 10 MG tablet Take 1 tablet (10 mg total) by mouth daily.  . benazepril (LOTENSIN) 40 MG tablet Take 1 tablet (40 mg total) by mouth daily.  . Biotin w/ Vitamins C & E (HAIR/SKIN/NAILS PO) Take 1 tablet by mouth daily.  . brimonidine (ALPHAGAN) 0.2 % ophthalmic solution 1 drop 2 (two) times daily.  . carvedilol (COREG) 25 MG tablet Take 1 tablet (25 mg total) by mouth 2 (two) times daily with a meal.  . cloNIDine (CATAPRES) 0.1 MG tablet Take 1 tablet (0.1 mg total) by mouth 2 (two) times daily.  . dorzolamide-timolol (COSOPT) 22.3-6.8 MG/ML ophthalmic solution Place 1 drop into both eyes 2 (two) times daily.   . Dulaglutide (TRULICITY) 1.5 JJ/0.0XF SOPN Inject 0.5 mLs (1.5 mg total) into the skin once a week.  . fexofenadine (ALLEGRA) 180 MG tablet Take 180 mg by mouth daily.  . fluticasone (FLONASE) 50 MCG/ACT nasal spray Place 2 sprays into both nostrils daily.  . furosemide (LASIX) 20 MG tablet Take 20 mg by mouth daily.  Marland Kitchen glucose blood (ONETOUCH ULTRA) test strip 1 each by Other route as needed for other. Use as instructed  . latanoprost (XALATAN) 0.005 % ophthalmic solution Place 1 drop into both eyes at bedtime.   Marland Kitchen levothyroxine (SYNTHROID) 150 MCG tablet Take 1 tablet (150 mcg total) by mouth daily before breakfast.  . lovastatin (MEVACOR) 40 MG tablet Take 1 tablet (40 mg total) by mouth at bedtime.  . nitrofurantoin, macrocrystal-monohydrate, (MACROBID) 100 MG capsule Take 1 capsule (100 mg total) by mouth 2 (two) times daily.  [name] DELICA LANCETS 81W MISC USE AS DIRECTED ONCE DAILY WITH TEST STRIPS E11.22   No facility-administered encounter medications on file as of 03/14/2021.    Reviewed chart for  medication changes ahead of medication coordination call.  No OVs, Consults, or hospital visits since last care coordination call/Pharmacist visit.  No medication changes indicated OR if recent visit, treatment plan here.  BP Readings from Last 3 Encounters:  10/04/20 130/70  07/27/20 127/75  07/13/20 120/69    Lab Results  Component Value Date   HGBA1C 6.3 10/04/2020     Patient obtains medications through Adherence Packaging  90 Days   Last adherence delivery included: Brimonidine OP 0.2% sol; one drop two times daily Latanoprost 0.005% OP sol; one drop into both eyes at bedtime  Patient is due for next adherence delivery on:03/22/21  Called patient and reviewed medications and coordinated delivery.  This delivery to include: Brimonidine 1 drop twice daily each eye 0.1% solution Latanoprost 0.5% solution 1 each eye at bedtime One touch blood glucose test strips  Amlodipine 10 mg take 1 tab daily Benazepril 40 mg take 1 tab daily Carvedilol 25 mg take 1 tab twice daily with meal Clonidine 0.1 mg take 1 tab twice daily Furosemide 20 mg take 1 tab daily Levothyroxine 150 mcg take 1 tab daily before breakfast Dorzolamide-Timolol 22.3/6.8 Drops place 1 drop on both eyes twice daily  Patient needs refills for Lovastatin CPP to request.  Confirmed delivery date of 03/22/2021, advised patient that pharmacy will contact them the morning of delivery.  [name], [place] [phone]

## 2021-03-15 DIAGNOSIS — N1832 Chronic kidney disease, stage 3b: Secondary | ICD-10-CM | POA: Diagnosis not present

## 2021-03-15 DIAGNOSIS — I10 Essential (primary) hypertension: Secondary | ICD-10-CM | POA: Diagnosis not present

## 2021-03-15 DIAGNOSIS — I700 Atherosclerosis of aorta: Secondary | ICD-10-CM | POA: Diagnosis not present

## 2021-03-15 DIAGNOSIS — I6523 Occlusion and stenosis of bilateral carotid arteries: Secondary | ICD-10-CM | POA: Diagnosis not present

## 2021-03-15 DIAGNOSIS — I2510 Atherosclerotic heart disease of native coronary artery without angina pectoris: Secondary | ICD-10-CM | POA: Diagnosis not present

## 2021-03-15 DIAGNOSIS — E782 Mixed hyperlipidemia: Secondary | ICD-10-CM | POA: Diagnosis not present

## 2021-03-15 DIAGNOSIS — R600 Localized edema: Secondary | ICD-10-CM | POA: Diagnosis not present

## 2021-03-15 MED ORDER — LOVASTATIN 40 MG PO TABS
40.0000 mg | ORAL_TABLET | Freq: Every day | ORAL | 1 refills | Status: DC
Start: 2021-03-15 — End: 2021-09-20

## 2021-03-19 ENCOUNTER — Other Ambulatory Visit: Payer: Self-pay | Admitting: Family Medicine

## 2021-04-03 ENCOUNTER — Ambulatory Visit (INDEPENDENT_AMBULATORY_CARE_PROVIDER_SITE_OTHER): Payer: PPO | Admitting: Family Medicine

## 2021-04-03 ENCOUNTER — Other Ambulatory Visit: Payer: Self-pay

## 2021-04-03 ENCOUNTER — Encounter: Payer: Self-pay | Admitting: Family Medicine

## 2021-04-03 VITALS — BP 114/70 | HR 78 | Wt 173.0 lb

## 2021-04-03 DIAGNOSIS — I700 Atherosclerosis of aorta: Secondary | ICD-10-CM

## 2021-04-03 DIAGNOSIS — K7460 Unspecified cirrhosis of liver: Secondary | ICD-10-CM

## 2021-04-03 DIAGNOSIS — E1169 Type 2 diabetes mellitus with other specified complication: Secondary | ICD-10-CM

## 2021-04-03 DIAGNOSIS — I129 Hypertensive chronic kidney disease with stage 1 through stage 4 chronic kidney disease, or unspecified chronic kidney disease: Secondary | ICD-10-CM

## 2021-04-03 DIAGNOSIS — J309 Allergic rhinitis, unspecified: Secondary | ICD-10-CM | POA: Diagnosis not present

## 2021-04-03 DIAGNOSIS — E785 Hyperlipidemia, unspecified: Secondary | ICD-10-CM | POA: Diagnosis not present

## 2021-04-03 DIAGNOSIS — E1122 Type 2 diabetes mellitus with diabetic chronic kidney disease: Secondary | ICD-10-CM

## 2021-04-03 DIAGNOSIS — N183 Chronic kidney disease, stage 3 unspecified: Secondary | ICD-10-CM | POA: Diagnosis not present

## 2021-04-03 DIAGNOSIS — D693 Immune thrombocytopenic purpura: Secondary | ICD-10-CM | POA: Diagnosis not present

## 2021-04-03 DIAGNOSIS — I2510 Atherosclerotic heart disease of native coronary artery without angina pectoris: Secondary | ICD-10-CM

## 2021-04-03 DIAGNOSIS — N2581 Secondary hyperparathyroidism of renal origin: Secondary | ICD-10-CM | POA: Diagnosis not present

## 2021-04-03 DIAGNOSIS — E039 Hypothyroidism, unspecified: Secondary | ICD-10-CM | POA: Diagnosis not present

## 2021-04-03 LAB — BAYER DCA HB A1C WAIVED: HB A1C (BAYER DCA - WAIVED): 6 % (ref <7.0)

## 2021-04-03 MED ORDER — CLONIDINE HCL 0.1 MG PO TABS
ORAL_TABLET | ORAL | 1 refills | Status: DC
Start: 2021-04-03 — End: 2021-10-10

## 2021-04-03 MED ORDER — BENAZEPRIL HCL 40 MG PO TABS
40.0000 mg | ORAL_TABLET | Freq: Every day | ORAL | 1 refills | Status: DC
Start: 2021-04-03 — End: 2021-10-10

## 2021-04-03 MED ORDER — FLUTICASONE PROPIONATE 50 MCG/ACT NA SUSP: 2.0000 | Freq: Every day | NASAL | 12 refills | Status: DC

## 2021-04-03 MED ORDER — TRULICITY 1.5 MG/0.5ML ~~LOC~~ SOAJ: 1.5000 mg | SUBCUTANEOUS | 1 refills | Status: AC

## 2021-04-03 MED ORDER — CARVEDILOL 25 MG PO TABS: 25.0000 mg | ORAL_TABLET | Freq: Two times a day (BID) | ORAL | 1 refills | Status: DC

## 2021-04-03 NOTE — Assessment & Plan Note (Signed)
Under good control on current regimen. Continue current regimen. Continue to monitor. Call with any concerns. Refills given.   

## 2021-04-03 NOTE — Assessment & Plan Note (Signed)
Rechecking labs today. Await results. Treat as needed.  °

## 2021-04-03 NOTE — Assessment & Plan Note (Signed)
Under great control with A1c of 6.0. Continue current regimen. Continue to monitor. Call with any concerns.

## 2021-04-03 NOTE — Assessment & Plan Note (Signed)
Will get BP and cholesterol under good control. Continue to monitor. Call with any concerns.

## 2021-04-03 NOTE — Progress Notes (Signed)
BP 114/70   Pulse 78   Wt 173 lb (78.5 kg)   SpO2 97%   BMI 32.69 kg/m    Subjective:    Patient ID: Julia Nielsen, female    DOB: 08-Aug-1941, 80 y.o.   MRN: [id number]  HPI: Julia Nielsen is a 80 y.o. female  Chief Complaint  Patient presents with   Diabetes   Hyperlipidemia   Hypertension   Hypothyroidism    DIABETES Hypoglycemic episodes:no Polydipsia/polyuria: no Visual disturbance: no Chest pain: no Paresthesias: yes Glucose Monitoring: yes  Accucheck frequency: Not Checking Taking Insulin?: no Blood Pressure Monitoring: not checking Retinal Examination: Not up to Date Foot Exam: Up to Date Diabetic Education: Completed Pneumovax: Up to Date Influenza: Up to Date Aspirin: yes  HYPERTENSION / HYPERLIPIDEMIA Satisfied with current treatment? yes Duration of hypertension: chronic BP monitoring frequency: not checking BP medication side effects: no Duration of hyperlipidemia: chronic Cholesterol medication side effects: no Cholesterol supplements: none Past cholesterol medications: lovastatin (mevacor) Medication compliance: excellent compliance Aspirin: yes Recent stressors: no Recurrent headaches: no Visual changes: no Palpitations: no Dyspnea: no Chest pain: no Lower extremity edema: no Dizzy/lightheaded: yes  HYPOTHYROIDISM Thyroid control status:controlled Satisfied with current treatment? yes Medication side effects: yes Medication compliance: excellent compliance Etiology of hypothyroidism:  Recent dose adjustment:no Fatigue: no Cold intolerance: no Heat intolerance: no Weight gain: no Weight loss: no Constipation: no Diarrhea/loose stools: no Palpitations: no Lower extremity edema: no Anxiety/depressed mood: no   Relevant past medical, surgical, family and social history reviewed and updated as indicated. Interim medical history since our last visit reviewed. Allergies and medications reviewed and updated.  Review of  Systems  Constitutional: Negative.   Respiratory: Negative.    Cardiovascular: Negative.   Gastrointestinal: Negative.   Musculoskeletal: Negative.   Psychiatric/Behavioral: Negative.     Per HPI unless specifically indicated above     Objective:    BP 114/70   Pulse 78   Wt 173 lb (78.5 kg)   SpO2 97%   BMI 32.69 kg/m   Wt Readings from Last 3 Encounters:  04/03/21 173 lb (78.5 kg)  10/04/20 180 lb 3.2 oz (81.7 kg)  07/27/20 180 lb (81.6 kg)    Physical Exam Vitals and nursing note reviewed.  Constitutional:      General: She is not in acute distress.    Appearance: Normal appearance. She is not ill-appearing, toxic-appearing or diaphoretic.  HENT:     Head: Normocephalic and atraumatic.     Right Ear: External ear normal.     Left Ear: External ear normal.     Nose: Nose normal.     Mouth/Throat:     Mouth: Mucous membranes are moist.     Pharynx: Oropharynx is clear.  Eyes:     General: No scleral icterus.       Right eye: No discharge.        Left eye: No discharge.     Extraocular Movements: Extraocular movements intact.     Conjunctiva/sclera: Conjunctivae normal.     Pupils: Pupils are equal, round, and reactive to light.  Cardiovascular:     Rate and Rhythm: Normal rate and regular rhythm.     Pulses: Normal pulses.     Heart sounds: Normal heart sounds. No murmur heard.   No friction rub. No gallop.  Pulmonary:     Effort: Pulmonary effort is normal. No respiratory distress.     Breath sounds: Normal breath sounds. No stridor. No wheezing,  rhonchi or rales.  Chest:     Chest wall: No tenderness.  Musculoskeletal:        General: Normal range of motion.     Cervical back: Normal range of motion and neck supple.  Skin:    General: Skin is warm and dry.     Capillary Refill: Capillary refill takes less than 2 seconds.     Coloration: Skin is not jaundiced or pale.     Findings: No bruising, erythema, lesion or rash.  Neurological:     General: No  focal deficit present.     Mental Status: She is alert and oriented to person, place, and time. Mental status is at baseline.  Psychiatric:        Mood and Affect: Mood normal.        Behavior: Behavior normal.        Thought Content: Thought content normal.        Judgment: Judgment normal.    Results for orders placed or performed in visit on 10/04/20  Urine Culture   Specimen: Urine   UR  Result Value Ref Range   Urine Culture, Routine Final report (A)    Organism ID, Bacteria Klebsiella pneumoniae (A)    Antimicrobial Susceptibility Comment   Microscopic Examination   BLD  Result Value Ref Range   WBC, UA >30 (A) 0 - 5 /hpf   RBC 3-10 (A) 0 - 2 /hpf   Epithelial Cells (non renal) 0-10 0 - 10 /hpf   Bacteria, UA Many (A) None seen/Few  Bayer DCA Hb A1c Waived  Result Value Ref Range   HB A1C (BAYER DCA - WAIVED) 6.3 <7.0 %  CBC with Differential/Platelet  Result Value Ref Range   WBC 4.0 3.4 - 10.8 x10E3/uL   RBC 4.64 3.77 - 5.28 x10E6/uL   Hemoglobin 13.4 11.1 - 15.9 g/dL   Hematocrit 41.7 34.0 - 46.6 %   MCV 90 79 - 97 fL   MCH 28.9 26.6 - 33.0 pg   MCHC 32.1 31.5 - 35.7 g/dL   RDW 13.2 11.7 - 15.4 %   Platelets 83 (LL) 150 - 450 x10E3/uL   Neutrophils 57 Not Estab. %   Lymphs 30 Not Estab. %   Monocytes 9 Not Estab. %   Eos 3 Not Estab. %   Basos 1 Not Estab. %   Neutrophils Absolute 2.3 1.4 - 7.0 x10E3/uL   Lymphocytes Absolute 1.2 0.7 - 3.1 x10E3/uL   Monocytes Absolute 0.4 0.1 - 0.9 x10E3/uL   EOS (ABSOLUTE) 0.1 0.0 - 0.4 x10E3/uL   Basophils Absolute 0.0 0.0 - 0.2 x10E3/uL   Immature Granulocytes 0 Not Estab. %   Immature Grans (Abs) 0.0 0.0 - 0.1 x10E3/uL   Hematology Comments: Note:   Comprehensive metabolic panel  Result Value Ref Range   Glucose 205 (H) 65 - 99 mg/dL   BUN 18 8 - 27 mg/dL   Creatinine, Ser 1.26 (H) 0.57 - 1.00 mg/dL   GFR calc non Af Amer 40 (L) >59 mL/min/1.73   GFR calc Af Amer 46 (L) >59 mL/min/1.73   BUN/Creatinine Ratio 14  12 - 28   Sodium 140 134 - 144 mmol/L   Potassium 4.1 3.5 - 5.2 mmol/L   Chloride 104 96 - 106 mmol/L   CO2 21 20 - 29 mmol/L   Calcium 9.4 8.7 - 10.3 mg/dL   Total Protein 6.5 6.0 - 8.5 g/dL   Albumin 4.2 3.6 - 4.6 g/dL   Globulin,  Total 2.3 1.5 - 4.5 g/dL   Albumin/Globulin Ratio 1.8 1.2 - 2.2   Bilirubin Total 0.8 0.0 - 1.2 mg/dL   Alkaline Phosphatase 51 44 - 121 IU/L   AST 23 0 - 40 IU/L   ALT 28 0 - 32 IU/L  Lipid Panel w/o Chol/HDL Ratio  Result Value Ref Range   Cholesterol, Total 139 100 - 199 mg/dL   Triglycerides 181 (H) 0 - 149 mg/dL   HDL 50 >39 mg/dL   VLDL Cholesterol Cal 30 5 - 40 mg/dL   LDL Chol Calc (NIH) 59 0 - 99 mg/dL  Microalbumin, Urine Waived  Result Value Ref Range   Microalb, Ur Waived 80 (H) 0 - 19 mg/L   Creatinine, Urine Waived 200 10 - 300 mg/dL   Microalb/Creat Ratio 30-300 (H) <30 mg/g  TSH  Result Value Ref Range   TSH 2.060 0.450 - 4.500 uIU/mL  Urinalysis, Routine w reflex microscopic  Result Value Ref Range   Specific Gravity, UA 1.010 1.005 - 1.030   pH, UA 5.0 5.0 - 7.5   Color, UA Yellow Yellow   Appearance Ur Hazy (A) Clear   Leukocytes,UA 2+ (A) Negative   Protein,UA 1+ (A) Negative/Trace   Glucose, UA Negative Negative   Ketones, UA Negative Negative   RBC, UA Trace (A) Negative   Bilirubin, UA Negative Negative   Urobilinogen, Ur 0.2 0.2 - 1.0 mg/dL   Nitrite, UA Negative Negative   Microscopic Examination See below:       Assessment & Plan:   Problem List Items Addressed This Visit       Cardiovascular and Mediastinum   CAD (coronary artery disease)    Will get BP and cholesterol under good control. Continue to monitor. Call with any concerns.        Relevant Medications   cloNIDine (CATAPRES) 0.1 MG tablet   carvedilol (COREG) 25 MG tablet   benazepril (LOTENSIN) 40 MG tablet   Other Relevant Orders   CBC with Differential/Platelet   Comprehensive metabolic panel   Atherosclerosis of aorta (HCC)    Will get  BP and cholesterol under good control. Continue to monitor. Call with any concerns.        Relevant Medications   cloNIDine (CATAPRES) 0.1 MG tablet   carvedilol (COREG) 25 MG tablet   benazepril (LOTENSIN) 40 MG tablet   Other Relevant Orders   CBC with Differential/Platelet   Comprehensive metabolic panel     Respiratory   Multiple respiratory allergies    Under good control on current regimen. Continue current regimen. Continue to monitor. Call with any concerns. Refills given.         Relevant Medications   fluticasone (FLONASE) 50 MCG/ACT nasal spray     Digestive   Cirrhosis of liver without ascites ([place])    Rechecking labs today. Await results. Treat as needed.        Relevant Orders   CBC with Differential/Platelet   Comprehensive metabolic panel     Endocrine   Hyperlipidemia associated with type 2 diabetes mellitus ([place])    Rechecking labs today. Await results. Treat as needed.        Relevant Medications   Dulaglutide (TRULICITY) 1.5 RD/4.0CX SOPN   benazepril (LOTENSIN) 40 MG tablet   Other Relevant Orders   CBC with Differential/Platelet   Comprehensive metabolic panel   Lipid Panel w/o Chol/HDL Ratio   Type 2 diabetes mellitus with stage 3 chronic kidney disease ([place])  Under great control with A1c of 6.0. Continue current regimen. Continue to monitor. Call with any concerns.        Relevant Medications   Dulaglutide (TRULICITY) 1.5 SK/8.7GO SOPN   benazepril (LOTENSIN) 40 MG tablet   Other Relevant Orders   Bayer DCA Hb A1c Waived   CBC with Differential/Platelet   Comprehensive metabolic panel   Secondary hyperparathyroidism of renal origin ([place])    Rechecking labs today. Await results. Treat as needed.        Relevant Orders   CBC with Differential/Platelet   Comprehensive metabolic panel   PTH, Intact and Calcium   VITAMIN D 25 Hydroxy (Vit-D Deficiency, Fractures)   Hypothyroidism    Rechecking labs today. Await results. Treat as  needed.        Relevant Medications   carvedilol (COREG) 25 MG tablet   Other Relevant Orders   CBC with Differential/Platelet   Comprehensive metabolic panel   TSH     Genitourinary   Benign hypertensive renal disease - Primary    Under good control on current regimen. Continue current regimen. Continue to monitor. Call with any concerns. Refills given. Labs drawn today.        Relevant Orders   CBC with Differential/Platelet   Comprehensive metabolic panel   CKD (chronic kidney disease) stage 3, GFR 30-59 ml/min (HCC)    Rechecking labs today. Await results. Treat as needed.        Relevant Orders   CBC with Differential/Platelet   Comprehensive metabolic panel     Hematopoietic and Hemostatic   Idiopathic thrombocytopenia ([place])    Rechecking labs today. Await results. Treat as needed.        Relevant Orders   CBC with Differential/Platelet   Comprehensive metabolic panel     Follow up plan: Return in about 6 months (around 10/03/2021) for physical.

## 2021-04-03 NOTE — Assessment & Plan Note (Signed)
Under good control on current regimen. Continue current regimen. Continue to monitor. Call with any concerns. Refills given. Labs drawn today.   

## 2021-04-04 LAB — LIPID PANEL W/O CHOL/HDL RATIO
Cholesterol, Total: 132 mg/dL (ref 100–199)
HDL: 50 mg/dL (ref >39)
LDL Chol Calc (NIH): 59 mg/dL (ref 0–99)
Triglycerides: 131 mg/dL (ref 0–149)
VLDL Cholesterol Cal: 23 mg/dL (ref 5–40)

## 2021-04-04 LAB — COMPREHENSIVE METABOLIC PANEL
ALT: 28 IU/L (ref 0–32)
AST: 30 IU/L (ref 0–40)
Albumin/Globulin Ratio: 2 (ref 1.2–2.2)
Albumin: 4.1 g/dL (ref 3.6–4.6)
Alkaline Phosphatase: 57 IU/L (ref 44–121)
BUN/Creatinine Ratio: 17 (ref 12–28)
BUN: 23 mg/dL (ref 8–27)
Bilirubin Total: 0.6 mg/dL (ref 0.0–1.2)
CO2: 18 mmol/L — ABNORMAL LOW (ref 20–29)
Calcium: 9.1 mg/dL (ref 8.7–10.3)
Chloride: 103 mmol/L (ref 96–106)
Creatinine, Ser: 1.33 mg/dL — ABNORMAL HIGH (ref 0.57–1.00)
Globulin, Total: 2.1 g/dL (ref 1.5–4.5)
Glucose: 203 mg/dL — ABNORMAL HIGH (ref 65–99)
Potassium: 4.4 mmol/L (ref 3.5–5.2)
Sodium: 141 mmol/L (ref 134–144)
Total Protein: 6.2 g/dL (ref 6.0–8.5)
eGFR: 40 mL/min/{1.73_m2} — ABNORMAL LOW (ref >59)

## 2021-04-04 LAB — CBC WITH DIFFERENTIAL/PLATELET
Basophils Absolute: 0 10*3/uL (ref 0.0–0.2)
Basos: 0 %
EOS (ABSOLUTE): 0.1 10*3/uL (ref 0.0–0.4)
Eos: 2 %
Hematocrit: 40.4 % (ref 34.0–46.6)
Hemoglobin: 12.9 g/dL (ref 11.1–15.9)
Immature Grans (Abs): 0 10*3/uL (ref 0.0–0.1)
Immature Granulocytes: 0 %
Lymphocytes Absolute: 1.1 10*3/uL (ref 0.7–3.1)
Lymphs: 31 %
MCH: 29.2 pg (ref 26.6–33.0)
MCHC: 31.9 g/dL (ref 31.5–35.7)
MCV: 91 fL (ref 79–97)
Monocytes Absolute: 0.3 10*3/uL (ref 0.1–0.9)
Monocytes: 9 %
Neutrophils Absolute: 2 10*3/uL (ref 1.4–7.0)
Neutrophils: 58 %
Platelets: 79 10*3/uL — CL (ref 150–450)
RBC: 4.42 x10E6/uL (ref 3.77–5.28)
RDW: 13.1 % (ref 11.7–15.4)
WBC: 3.4 10*3/uL (ref 3.4–10.8)

## 2021-04-04 LAB — VITAMIN D 25 HYDROXY (VIT D DEFICIENCY, FRACTURES): Vit D, 25-Hydroxy: 43.1 ng/mL (ref 30.0–100.0)

## 2021-04-04 LAB — PTH, INTACT AND CALCIUM: PTH: 29 pg/mL (ref 15–65)

## 2021-04-04 LAB — TSH: TSH: 1.65 u[IU]/mL (ref 0.450–4.500)

## 2021-04-05 ENCOUNTER — Encounter: Payer: Self-pay | Admitting: Family Medicine

## 2021-04-09 DIAGNOSIS — R600 Localized edema: Secondary | ICD-10-CM | POA: Diagnosis not present

## 2021-04-09 DIAGNOSIS — I2510 Atherosclerotic heart disease of native coronary artery without angina pectoris: Secondary | ICD-10-CM | POA: Diagnosis not present

## 2021-04-09 DIAGNOSIS — N1832 Chronic kidney disease, stage 3b: Secondary | ICD-10-CM | POA: Diagnosis not present

## 2021-04-30 DIAGNOSIS — I2510 Atherosclerotic heart disease of native coronary artery without angina pectoris: Secondary | ICD-10-CM | POA: Diagnosis not present

## 2021-04-30 DIAGNOSIS — E782 Mixed hyperlipidemia: Secondary | ICD-10-CM | POA: Diagnosis not present

## 2021-04-30 DIAGNOSIS — N1832 Chronic kidney disease, stage 3b: Secondary | ICD-10-CM | POA: Diagnosis not present

## 2021-04-30 DIAGNOSIS — I10 Essential (primary) hypertension: Secondary | ICD-10-CM | POA: Diagnosis not present

## 2021-04-30 DIAGNOSIS — R600 Localized edema: Secondary | ICD-10-CM | POA: Diagnosis not present

## 2021-05-02 ENCOUNTER — Ambulatory Visit: Payer: PPO

## 2021-05-04 ENCOUNTER — Ambulatory Visit (INDEPENDENT_AMBULATORY_CARE_PROVIDER_SITE_OTHER): Payer: PPO

## 2021-05-04 VITALS — Ht 62.0 in | Wt 171.0 lb

## 2021-05-04 DIAGNOSIS — Z13820 Encounter for screening for osteoporosis: Secondary | ICD-10-CM | POA: Diagnosis not present

## 2021-05-04 DIAGNOSIS — Z0000 Encounter for general adult medical examination without abnormal findings: Secondary | ICD-10-CM

## 2021-05-04 NOTE — Progress Notes (Signed)
I connected with Chelcee Gulbrandson today by telephone and verified that I am speaking with the correct person using two identifiers. Location patient: home Location provider: work Persons participating in the virtual visit: Besty Cebollero, Glenna Durand LPN.   I discussed the limitations, risks, security and privacy concerns of performing an evaluation and management service by telephone and the availability of in person appointments. I also discussed with the patient that there may be a patient responsible charge related to this service. The patient expressed understanding and verbally consented to this telephonic visit.    Interactive audio and video telecommunications were attempted between this provider and patient, however failed, due to patient having technical difficulties OR patient did not have access to video capability.  We continued and completed visit with audio only.     Vital signs may be patient reported or missing.  Subjective:   Julia Nielsen is a 80 y.o. female who presents for Medicare Annual (Subsequent) preventive examination.  Review of Systems     Cardiac Risk Factors include: advanced age (>55mn, >>78women);diabetes mellitus;dyslipidemia;obesity (BMI >30kg/m2);hypertension     Objective:    Today's Vitals   05/04/21 0856  Weight: 171 lb (77.6 kg)  Height: '5\' 2"'$  (1.575 m)   Body mass index is 31.28 kg/m.  Advanced Directives 05/04/2021 05/01/2020 04/19/2019 08/12/2018 04/10/2018 03/21/2017 02/28/2017  Does Patient Have a Medical Advance Directive? No No No No No No No  Would patient like information on creating a medical advance directive? - - - No - Patient declined Yes (MAU/Ambulatory/Procedural Areas - Information given) Yes (MAU/Ambulatory/Procedural Areas - Information given) No - Patient declined    Current Medications (verified) Outpatient Encounter Medications as of 05/04/2021  Medication Sig   amLODipine (NORVASC) 10 MG tablet TAKE ONE TABLET BY MOUTH EVERY  MORNING   benazepril (LOTENSIN) 40 MG tablet Take 1 tablet (40 mg total) by mouth daily.   Biotin w/ Vitamins C & E (HAIR/SKIN/NAILS PO) Take 1 tablet by mouth daily.   brimonidine (ALPHAGAN) 0.2 % ophthalmic solution 1 drop 2 (two) times daily.   carvedilol (COREG) 25 MG tablet Take 1 tablet (25 mg total) by mouth 2 (two) times daily with a meal.   cloNIDine (CATAPRES) 0.1 MG tablet TAKE ONE TABLET BY MOUTH EVERY MORNING and TAKE ONE TABLET BY MOUTH EVERY EVENING   dorzolamide-timolol (COSOPT) 22.3-6.8 MG/ML ophthalmic solution Place 1 drop into both eyes 2 (two) times daily.    Dulaglutide (TRULICITY) 1.5 M0000000SOPN Inject 1.5 mg into the skin once a week.   fexofenadine (ALLEGRA) 180 MG tablet Take 180 mg by mouth daily.   fluticasone (FLONASE) 50 MCG/ACT nasal spray Place 2 sprays into both nostrils daily.   furosemide (LASIX) 20 MG tablet Take 20 mg by mouth daily.   glucose blood (ONETOUCH ULTRA) test strip 1 each by Other route as needed for other. Use as instructed   latanoprost (XALATAN) 0.005 % ophthalmic solution Place 1 drop into both eyes at bedtime.    levothyroxine (SYNTHROID) 150 MCG tablet Take 1 tablet (150 mcg total) by mouth daily before breakfast.   lovastatin (MEVACOR) 40 MG tablet Take 1 tablet (40 mg total) by mouth at bedtime.   ONETOUCH DELICA LANCETS [id number] USE AS DIRECTED ONCE DAILY WITH TEST STRIPS E11.22   No facility-administered encounter medications on file as of 05/04/2021.    Allergies (verified) Patient has no known allergies.   History: Past Medical History:  Diagnosis Date   Arthritis  OSTEO OF KNEE   Breast cancer ([place]) 2000   left breast cancer, radiation tx's.   CAD (coronary artery disease)    Carotid artery stenosis    bilateral   Cataract    Chronic kidney disease    Chronic stage 3   Coronary heart disease    Diabetes mellitus without complication (HCC)    Edema of both legs    GERD (gastroesophageal reflux disease)     Glaucoma    Hyperlipemia    mixed   Hyperlipidemia    Hypertension    Hypothyroidism    Mitral insufficiency    moderate   Osteopenia    Osteopenia    Personal history of radiation therapy 2000   LEFT lumpectomy   Stenosis of carotid artery    Thrombocytopenia (HCC)    Tricuspid insufficiency    moderate   Past Surgical History:  Procedure Laterality Date   APPENDECTOMY     BLADDER SURGERY     BREAST EXCISIONAL BIOPSY Left 2000   breast ca   BREAST LUMPECTOMY Left    BREAST SURGERY     CARDIAC CATHETERIZATION     CARPAL TUNNEL RELEASE Bilateral    CHOLECYSTECTOMY     COLONOSCOPY     COLONOSCOPY WITH PROPOFOL N/A 08/12/2018   Procedure: COLONOSCOPY WITH PROPOFOL;  Surgeon: Manya Silvas, MD;  Location: [facility] ENDOSCOPY;  Service: Endoscopy;  Laterality: N/A;   JOINT REPLACEMENT Right 2015   knee   parathyroid surgery     SHOULDER SURGERY Right    TUBAL LIGATION     Family History  Problem Relation Age of Onset   Gallbladder disease Mother    Aneurysm Mother    COPD Father    Congestive Heart Failure Father    Emphysema Father    Leukemia Sister    Stroke Brother    Dementia Brother    Alzheimer's disease Brother    Congestive Heart Failure Sister    Emphysema Sister    Breast cancer Neg Hx    Social History   Socioeconomic History   Marital status: Married    Spouse name: Not on file   Number of children: Not on file   Years of education: Not on file   Highest education level: High school graduate  Occupational History   Occupation: retired  Tobacco Use   Smoking status: Never   Smokeless tobacco: Never  Vaping Use   Vaping Use: Never used  Substance and Sexual Activity   Alcohol use: No   Drug use: No   Sexual activity: Not on file  Other Topics Concern   Not on file  Social History Narrative   Not on file   Social Determinants of Health   Financial Resource Strain: Low Risk    Difficulty of Paying Living Expenses: Not hard at all   Food Insecurity: No Food Insecurity   Worried About Charity fundraiser in the Last Year: Never true   [place] in the Last Year: Never true  Transportation Needs: No Transportation Needs   Lack of Transportation (Medical): No   Lack of Transportation (Non-Medical): No  Physical Activity: Sufficiently Active   Days of Exercise per Week: 7 days   Minutes of Exercise per Session: 60 min  Stress: No Stress Concern Present   Feeling of Stress : Not at all  Social Connections: Not on file    Tobacco Counseling Counseling given: Not Answered   Clinical Intake:  Pre-visit preparation completed:  Yes  Pain : No/denies pain     Nutritional Status: BMI > 30  Obese Nutritional Risks: None Diabetes: Yes  How often do you need to have someone help you when you read instructions, pamphlets, or other written materials from your doctor or pharmacy?: 1 - Never What is the last grade level you completed in school?: 12th grade  Diabetic? Yes Nutrition Risk Assessment:  Has the patient had any N/V/D within the last 2 months?  No  Does the patient have any non-healing wounds?  No  Has the patient had any unintentional weight loss or weight gain?  No   Diabetes:  Is the patient diabetic?  Yes  If diabetic, was a CBG obtained today?  No  Did the patient bring in their glucometer from home?  No  How often do you monitor your CBG's? daily.   Financial Strains and Diabetes Management:  Are you having any financial strains with the device, your supplies or your medication? No .  Does the patient want to be seen by Chronic Care Management for management of their diabetes?  No  Would the patient like to be referred to a Nutritionist or for Diabetic Management?  No   Diabetic Exams:  Diabetic Eye Exam: Completed 07/05/2020 Diabetic Foot Exam: Overdue, Pt has been advised about the importance in completing this exam. Pt is scheduled for diabetic foot exam on next  appointment.   Interpreter Needed?: No  Information entered by :: NAllen LPN   Activities of Daily Living In your present state of health, do you have any difficulty performing the following activities: 05/04/2021 10/04/2020  Hearing? N N  Vision? N N  Difficulty concentrating or making decisions? N N  Walking or climbing stairs? N Y  Dressing or bathing? N N  Doing errands, shopping? Y N  Comment does not drive Facilities manager and eating ? N -  Using the Toilet? N -  In the past six months, have you accidently leaked urine? N -  Do you have problems with loss of bowel control? N -  Managing your Medications? N -  Managing your Finances? N -  Housekeeping or managing your Housekeeping? N -  Some recent data might be hidden    Patient Care Team: Valerie Roys, DO as PCP - General (Family Medicine) Vladimir Faster, [facility] as Pharmacist (Pharmacist)  Indicate any recent Medical Services you may have received from other than [facility] providers in the past year (date may be approximate).     Assessment:   This is a routine wellness examination for [place].  Hearing/Vision screen Vision Screening - Comments:: Regular eye exams, [facility]  Dietary issues and exercise activities discussed: Current Exercise Habits: Home exercise routine, Type of exercise: Other - see comments (pedal machine), Time (Minutes): 60, Frequency (Times/Week): 7, Weekly Exercise (Minutes/Week): 420   Goals Addressed             This Visit's Progress    Patient Stated       05/04/2021, wants to lose weight       Depression Screen PHQ 2/9 Scores 05/04/2021 10/04/2020 07/27/2020 05/01/2020 09/16/2019 06/22/2019 04/19/2019  PHQ - 2 Score 0 0 0 0 0 0 0  PHQ- 9 Score - - - 0 - 0 -    Fall Risk Fall Risk  05/04/2021 10/04/2020 05/09/2020 05/01/2020 09/16/2019  Falls in the past year? 0 0 0 0 0  Comment - - Emmi Telephone Survey: data to  providers prior to load - -  Number falls in past yr: - 0 - - 0   Injury with Fall? - 0 - - 0  Risk for fall due to : Medication side effect No Fall Risks - Medication side effect -  Follow up Falls evaluation completed;Education provided;Falls prevention discussed Falls evaluation completed - Falls evaluation completed;Education provided;Falls prevention discussed -    FALL RISK PREVENTION PERTAINING TO THE HOME:  Any stairs in or around the home? Yes  If so, are there any without handrails? No  Home free of loose throw rugs in walkways, pet beds, electrical cords, etc? Yes  Adequate lighting in your home to reduce risk of falls? Yes   ASSISTIVE DEVICES UTILIZED TO PREVENT FALLS:  Life alert? No  Use of a cane, walker or w/c? Yes  Grab bars in the bathroom? Yes  Shower chair or bench in shower? Yes  Elevated toilet seat or a handicapped toilet? No   TIMED UP AND GO:  Was the test performed? No .      Cognitive Function:     6CIT Screen 05/04/2021 05/01/2020 06/22/2019 04/19/2019 04/10/2018  What Year? 0 points 0 points 0 points 0 points 0 points  What month? 0 points 0 points 0 points 0 points 0 points  What time? 0 points 0 points 0 points 0 points 0 points  Count back from 20 0 points 0 points 0 points 0 points 0 points  Months in reverse 0 points 0 points 0 points 0 points 0 points  Repeat phrase 0 points 0 points 0 points 0 points 0 points  Total Score 0 0 0 0 0    Immunizations Immunization History  Administered Date(s) Administered   Fluad Quad(high Dose 65+) 06/22/2019, 07/13/2020   Influenza, High Dose Seasonal PF 06/18/2016, 08/27/2017, 08/06/2018   Influenza,inj,Quad PF,6+ Mos 07/05/2015   Moderna Sars-Covid-2 Vaccination 02/29/2020, 03/28/2020   Pneumococcal Conjugate-13 12/08/2014   Pneumococcal-Unspecified 08/15/1999, 06/18/2005   Td 01/23/2009, 10/04/2020    TDAP status: Up to date  Flu Vaccine status: Up to date  Pneumococcal vaccine status: Up to date  Covid-19 vaccine status: Completed vaccines  Qualifies for  Shingles Vaccine? Yes   Zostavax completed No   Shingrix Completed?: No.    Education has been provided regarding the importance of this vaccine. Patient has been advised to call insurance company to determine out of pocket expense if they have not yet received this vaccine. Advised may also receive vaccine at local pharmacy or Health Dept. Verbalized acceptance and understanding.  Screening Tests Health Maintenance  Topic Date Due   Zoster Vaccines- Shingrix (1 of 2) Never done   COVID-19 Vaccine (3 - Booster for Moderna series) 08/28/2020   FOOT EXAM  09/15/2020   INFLUENZA VACCINE  05/14/2021   OPHTHALMOLOGY EXAM  07/03/2021   MAMMOGRAM  07/05/2021   COLONOSCOPY (Pts 45-69yr Insurance coverage will need to be confirmed)  08/12/2021   HEMOGLOBIN A1C  10/03/2021   TETANUS/TDAP  10/04/2030   DEXA SCAN  Completed   PNA vac Low Risk Adult  Completed   HPV VACCINES  Aged Out    Health Maintenance  Health Maintenance Due  Topic Date Due   Zoster Vaccines- Shingrix (1 of 2) Never done   COVID-19 Vaccine (3 - Booster for Moderna series) 08/28/2020   FOOT EXAM  09/15/2020    Colorectal cancer screening: No longer required.   Mammogram status: Completed 07/05/2020. Repeat every year  Bone Density status: Completed 03/15/2014.  Ordered today  Lung Cancer Screening: (Low Dose CT Chest recommended if Age 51-80 years, 30 pack-year currently smoking OR have quit w/in 15years.) does not qualify.   Lung Cancer Screening Referral: no  Additional Screening:  Hepatitis C Screening: does not qualify;   Vision Screening: Recommended annual ophthalmology exams for early detection of glaucoma and other disorders of the eye. Is the patient up to date with their annual eye exam?  Yes  Who is the provider or what is the name of the office in which the patient attends annual eye exams? [facility] If pt is not established with a provider, would they like to be referred to a provider to  establish care? No .   Dental Screening: Recommended annual dental exams for proper oral hygiene  Community Resource Referral / Chronic Care Management: CRR required this visit?  No   CCM required this visit?  No      Plan:     I have personally reviewed and noted the following in the patient's chart:   Medical and social history Use of alcohol, tobacco or illicit drugs  Current medications and supplements including opioid prescriptions.  Functional ability and status Nutritional status Physical activity Advanced directives List of other physicians Hospitalizations, surgeries, and ER visits in previous 12 months Vitals Screenings to include cognitive, depression, and falls Referrals and appointments  In addition, I have reviewed and discussed with patient certain preventive protocols, quality metrics, and best practice recommendations. A written personalized care plan for preventive services as well as general preventive health recommendations were provided to patient.     Kellie Simmering, LPN   [id number]   Nurse Notes:

## 2021-05-04 NOTE — Patient Instructions (Signed)
Julia Nielsen , Thank you for taking time to come for your Medicare Wellness Visit. I appreciate your ongoing commitment to your health goals. Please review the following plan we discussed and let me know if I can assist you in the future.   Screening recommendations/referrals: Colonoscopy: not required Mammogram: completed 07/05/2020 Bone Density: ordered today Recommended yearly ophthalmology/optometry visit for glaucoma screening and checkup Recommended yearly dental visit for hygiene and checkup  Vaccinations: Influenza vaccine: completed 07/13/2020, due 05/14/2021 Pneumococcal vaccine: completed 12/08/2014 Tdap vaccine: completed 10/04/2020, due 10/04/2030 Shingles vaccine: decline   Covid-19: 10/10/2020, 03/28/2020, 02/29/2020  Advanced directives: Advance directive discussed with you today.   Conditions/risks identified: none  Next appointment: Follow up in one year for your annual wellness visit    Preventive Care 65 Years and Older, Female Preventive care refers to lifestyle choices and visits with your health care provider that can promote health and wellness. What does preventive care include? A yearly physical exam. This is also called an annual well check. Dental exams once or twice a year. Routine eye exams. Ask your health care provider how often you should have your eyes checked. Personal lifestyle choices, including: Daily care of your teeth and gums. Regular physical activity. Eating a healthy diet. Avoiding tobacco and drug use. Limiting alcohol use. Practicing safe sex. Taking low-dose aspirin every day. Taking vitamin and mineral supplements as recommended by your health care provider. What happens during an annual well check? The services and screenings done by your health care provider during your annual well check will depend on your age, overall health, lifestyle risk factors, and family history of disease. Counseling  Your health care provider may ask you  questions about your: Alcohol use. Tobacco use. Drug use. Emotional well-being. Home and relationship well-being. Sexual activity. Eating habits. History of falls. Memory and ability to understand (cognition). Work and work Statistician. Reproductive health. Screening  You may have the following tests or measurements: Height, weight, and BMI. Blood pressure. Lipid and cholesterol levels. These may be checked every 5 years, or more frequently if you are over 46 years old. Skin check. Lung cancer screening. You may have this screening every year starting at age 73 if you have a 30-pack-year history of smoking and currently smoke or have quit within the past 15 years. Fecal occult blood test (FOBT) of the stool. You may have this test every year starting at age 26. Flexible sigmoidoscopy or colonoscopy. You may have a sigmoidoscopy every 5 years or a colonoscopy every 10 years starting at age 73. Hepatitis C blood test. Hepatitis B blood test. Sexually transmitted disease (STD) testing. Diabetes screening. This is done by checking your blood sugar (glucose) after you have not eaten for a while (fasting). You may have this done every 1-3 years. Bone density scan. This is done to screen for osteoporosis. You may have this done starting at age 20. Mammogram. This may be done every 1-2 years. Talk to your health care provider about how often you should have regular mammograms. Talk with your health care provider about your test results, treatment options, and if necessary, the need for more tests. Vaccines  Your health care provider may recommend certain vaccines, such as: Influenza vaccine. This is recommended every year. Tetanus, diphtheria, and acellular pertussis (Tdap, Td) vaccine. You may need a Td booster every 10 years. Zoster vaccine. You may need this after age 81. Pneumococcal 13-valent conjugate (PCV13) vaccine. One dose is recommended after age 28. Pneumococcal polysaccharide  (PPSV23)  vaccine. One dose is recommended after age 25. Talk to your health care provider about which screenings and vaccines you need and how often you need them. This information is not intended to replace advice given to you by your health care provider. Make sure you discuss any questions you have with your health care provider. Document Released: 10/27/2015 Document Revised: 06/19/2016 Document Reviewed: 08/01/2015 Elsevier Interactive Patient Education  2017 [employer] Prevention in the Home Falls can cause injuries. They can happen to people of all ages. There are many things you can do to make your home safe and to help prevent falls. What can I do on the outside of my home? Regularly fix the edges of walkways and driveways and fix any cracks. Remove anything that might make you trip as you walk through a door, such as a raised step or threshold. Trim any bushes or trees on the path to your home. Use bright outdoor lighting. Clear any walking paths of anything that might make someone trip, such as rocks or tools. Regularly check to see if handrails are loose or broken. Make sure that both sides of any steps have handrails. Any raised decks and porches should have guardrails on the edges. Have any leaves, snow, or ice cleared regularly. Use sand or salt on walking paths during winter. Clean up any spills in your garage right away. This includes oil or grease spills. What can I do in the bathroom? Use night lights. Install grab bars by the toilet and in the tub and shower. Do not use towel bars as grab bars. Use non-skid mats or decals in the tub or shower. If you need to sit down in the shower, use a plastic, non-slip stool. Keep the floor dry. Clean up any water that spills on the floor as soon as it happens. Remove soap buildup in the tub or shower regularly. Attach bath mats securely with double-sided non-slip rug tape. Do not have throw rugs and other things on the  floor that can make you trip. What can I do in the bedroom? Use night lights. Make sure that you have a light by your bed that is easy to reach. Do not use any sheets or blankets that are too big for your bed. They should not hang down onto the floor. Have a firm chair that has side arms. You can use this for support while you get dressed. Do not have throw rugs and other things on the floor that can make you trip. What can I do in the kitchen? Clean up any spills right away. Avoid walking on wet floors. Keep items that you use a lot in easy-to-reach places. If you need to reach something above you, use a strong step stool that has a grab bar. Keep electrical cords out of the way. Do not use floor polish or wax that makes floors slippery. If you must use wax, use non-skid floor wax. Do not have throw rugs and other things on the floor that can make you trip. What can I do with my stairs? Do not leave any items on the stairs. Make sure that there are handrails on both sides of the stairs and use them. Fix handrails that are broken or loose. Make sure that handrails are as long as the stairways. Check any carpeting to make sure that it is firmly attached to the stairs. Fix any carpet that is loose or worn. Avoid having throw rugs at the top or bottom of  the stairs. If you do have throw rugs, attach them to the floor with carpet tape. Make sure that you have a light switch at the top of the stairs and the bottom of the stairs. If you do not have them, ask someone to add them for you. What else can I do to help prevent falls? Wear shoes that: Do not have high heels. Have rubber bottoms. Are comfortable and fit you well. Are closed at the toe. Do not wear sandals. If you use a stepladder: Make sure that it is fully opened. Do not climb a closed stepladder. Make sure that both sides of the stepladder are locked into place. Ask someone to hold it for you, if possible. Clearly mark and make  sure that you can see: Any grab bars or handrails. First and last steps. Where the edge of each step is. Use tools that help you move around (mobility aids) if they are needed. These include: Canes. Walkers. Scooters. Crutches. Turn on the lights when you go into a dark area. Replace any light bulbs as soon as they burn out. Set up your furniture so you have a clear path. Avoid moving your furniture around. If any of your floors are uneven, fix them. If there are any pets around you, be aware of where they are. Review your medicines with your doctor. Some medicines can make you feel dizzy. This can increase your chance of falling. Ask your doctor what other things that you can do to help prevent falls. This information is not intended to replace advice given to you by your health care provider. Make sure you discuss any questions you have with your health care provider. Document Released: 07/27/2009 Document Revised: 03/07/2016 Document Reviewed: 11/04/2014 Elsevier Interactive Patient Education  2017 [employer].

## 2021-05-14 ENCOUNTER — Encounter: Payer: Self-pay | Admitting: Nurse Practitioner

## 2021-05-14 ENCOUNTER — Ambulatory Visit (INDEPENDENT_AMBULATORY_CARE_PROVIDER_SITE_OTHER): Payer: PPO | Admitting: Nurse Practitioner

## 2021-05-14 DIAGNOSIS — U071 COVID-19: Secondary | ICD-10-CM | POA: Insufficient documentation

## 2021-05-14 MED ORDER — PREDNISONE 10 MG PO TABS: ORAL_TABLET | ORAL | 0 refills | Status: DC

## 2021-05-14 NOTE — Assessment & Plan Note (Signed)
Symptoms x7 days, positive home test 6 days ago on 05/08/21. She is out of the time frame for paxlovid, molnupiravir, and monoclonal antibodies. Encouraged fluids, rest, small bland meals. Will treat with prednisone taper. Educated it may make her blood sugar go up for a few days. She needs to isolate and wear a mask if she needs to go out in public for 3 more days. Follow-up if symptoms worsen or don't improve.

## 2021-05-14 NOTE — Progress Notes (Signed)
Acute Office Visit  Subjective:    Patient ID: Julia Nielsen, female    DOB: 09/22/1941, 80 y.o.   MRN: [id number]  Chief Complaint  Patient presents with   Cough    Patient states she is having cough, stuffy nose and body aches. Patient states she is having low appetite and a little bit of diarrhea.    Covid Positive    Patient states she tested positive last Tuesday, patient states her symptoms began last Monday.     HPI Patient is in today for cough, congestion, body aches and a covid-19 positive last Tuesday.   UPPER RESPIRATORY TRACT INFECTION  Worst symptom: weak and "blah" Fever: no Cough: yes Shortness of breath: no Wheezing: no Chest pain: no Chest tightness: no Chest congestion: no Nasal congestion: yes Runny nose: yes Post nasal drip: yes Sneezing: no Sore throat: no Swollen glands: no Sinus pressure: no Headache: yes-resolved with tylenol Face pain: no Toothache: no Ear pain: no  Ear pressure: no  Eyes red/itching:no Eye drainage/crusting: no  Vomiting: no Rash: no Fatigue: yes Sick contacts: yes--husband tested positive at the same time Strep contacts: no  Context: fluctuating Recurrent sinusitis: no Relief with OTC cold/cough medications: yes  Treatments attempted:  dayquil, nyquil, tylenol, pedialyte     Past Medical History:  Diagnosis Date   Arthritis    OSTEO OF KNEE   Breast cancer ([place]) 2000   left breast cancer, radiation tx's.   CAD (coronary artery disease)    Carotid artery stenosis    bilateral   Cataract    Chronic kidney disease    Chronic stage 3   Coronary heart disease    Diabetes mellitus without complication (HCC)    Edema of both legs    GERD (gastroesophageal reflux disease)    Glaucoma    Hyperlipemia    mixed   Hyperlipidemia    Hypertension    Hypothyroidism    Mitral insufficiency    moderate   Osteopenia    Osteopenia    Personal history of radiation therapy 2000   LEFT lumpectomy   Stenosis of  carotid artery    Thrombocytopenia (HCC)    Tricuspid insufficiency    moderate    Past Surgical History:  Procedure Laterality Date   APPENDECTOMY     BLADDER SURGERY     BREAST EXCISIONAL BIOPSY Left 2000   breast ca   BREAST LUMPECTOMY Left    BREAST SURGERY     CARDIAC CATHETERIZATION     CARPAL TUNNEL RELEASE Bilateral    CHOLECYSTECTOMY     COLONOSCOPY     COLONOSCOPY WITH PROPOFOL N/A 08/12/2018   Procedure: COLONOSCOPY WITH PROPOFOL;  Surgeon: Manya Silvas, MD;  Location: [facility] ENDOSCOPY;  Service: Endoscopy;  Laterality: N/A;   JOINT REPLACEMENT Right 2015   knee   parathyroid surgery     SHOULDER SURGERY Right    TUBAL LIGATION      Family History  Problem Relation Age of Onset   Gallbladder disease Mother    Aneurysm Mother    COPD Father    Congestive Heart Failure Father    Emphysema Father    Leukemia Sister    Stroke Brother    Dementia Brother    Alzheimer's disease Brother    Congestive Heart Failure Sister    Emphysema Sister    Breast cancer Neg Hx     Social History   Socioeconomic History   Marital status: Married  Spouse name: Not on file   Number of children: Not on file   Years of education: Not on file   Highest education level: High school graduate  Occupational History   Occupation: retired  Tobacco Use   Smoking status: Never   Smokeless tobacco: Never  Vaping Use   Vaping Use: Never used  Substance and Sexual Activity   Alcohol use: No   Drug use: No   Sexual activity: Not Currently  Other Topics Concern   Not on file  Social History Narrative   Not on file   Social Determinants of Health   Financial Resource Strain: Low Risk    Difficulty of Paying Living Expenses: Not hard at all  Food Insecurity: No Food Insecurity   Worried About Charity fundraiser in the Last Year: Never true   [place] in the Last Year: Never true  Transportation Needs: No Transportation Needs   Lack of Transportation  (Medical): No   Lack of Transportation (Non-Medical): No  Physical Activity: Sufficiently Active   Days of Exercise per Week: 7 days   Minutes of Exercise per Session: 60 min  Stress: No Stress Concern Present   Feeling of Stress : Not at all  Social Connections: Not on file  Intimate Partner Violence: Not on file    Outpatient Medications Prior to Visit  Medication Sig Dispense Refill   amLODipine (NORVASC) 10 MG tablet TAKE ONE TABLET BY MOUTH EVERY MORNING 90 tablet 0   benazepril (LOTENSIN) 40 MG tablet Take 1 tablet (40 mg total) by mouth daily. 90 tablet 1   Biotin w/ Vitamins C & E (HAIR/SKIN/NAILS PO) Take 1 tablet by mouth daily.     brimonidine (ALPHAGAN) 0.2 % ophthalmic solution 1 drop 2 (two) times daily.     carvedilol (COREG) 25 MG tablet Take 1 tablet (25 mg total) by mouth 2 (two) times daily with a meal. 180 tablet 1   cloNIDine (CATAPRES) 0.1 MG tablet TAKE ONE TABLET BY MOUTH EVERY MORNING and TAKE ONE TABLET BY MOUTH EVERY EVENING 180 tablet 1   dorzolamide-timolol (COSOPT) 22.3-6.8 MG/ML ophthalmic solution Place 1 drop into both eyes 2 (two) times daily.      Dulaglutide (TRULICITY) 1.5 SA/6.3KZ SOPN Inject 1.5 mg into the skin once a week. 6 mL 1   fexofenadine (ALLEGRA) 180 MG tablet Take 180 mg by mouth daily.     fluticasone (FLONASE) 50 MCG/ACT nasal spray Place 2 sprays into both nostrils daily. 48 g 12   furosemide (LASIX) 20 MG tablet Take 20 mg by mouth daily.     glucose blood (ONETOUCH ULTRA) test strip 1 each by Other route as needed for other. Use as instructed 100 each 11   latanoprost (XALATAN) 0.005 % ophthalmic solution Place 1 drop into both eyes at bedtime.      levothyroxine (SYNTHROID) 150 MCG tablet Take 1 tablet (150 mcg total) by mouth daily before breakfast. 90 tablet 3   lovastatin (MEVACOR) 40 MG tablet Take 1 tablet (40 mg total) by mouth at bedtime. 90 tablet 1   ONETOUCH DELICA LANCETS 60F MISC USE AS DIRECTED ONCE DAILY WITH TEST  STRIPS E11.22 100 each 12   No facility-administered medications prior to visit.    No Known Allergies  Review of Systems  Constitutional:  Positive for fatigue. Negative for fever.  HENT:  Positive for congestion, postnasal drip and rhinorrhea. Negative for ear pain, sinus pressure, sneezing and sore throat.   Eyes:  Negative.   Respiratory:  Positive for cough. Negative for shortness of breath.   Cardiovascular: Negative.   Gastrointestinal:  Positive for diarrhea. Negative for abdominal pain.  Genitourinary: Negative.   Musculoskeletal:  Positive for myalgias.  Skin: Negative.   Neurological:  Positive for weakness. Negative for dizziness, light-headedness and headaches.      Objective:    Physical Exam Vitals and nursing note reviewed.  Pulmonary:     Effort: Pulmonary effort is normal.     Comments: Able to talk in complete sentences Neurological:     Mental Status: She is oriented to person, place, and time.  Psychiatric:        Thought Content: Thought content normal.    There were no vitals taken for this visit. Wt Readings from Last 3 Encounters:  05/04/21 171 lb (77.6 kg)  04/03/21 173 lb (78.5 kg)  10/04/20 180 lb 3.2 oz (81.7 kg)    Health Maintenance Due  Topic Date Due   FOOT EXAM  09/15/2020   COVID-19 Vaccine (4 - Booster for Moderna series) 02/08/2021   INFLUENZA VACCINE  05/14/2021    There are no preventive care reminders to display for this patient.   Lab Results  Component Value Date   TSH 1.650 04/03/2021   Lab Results  Component Value Date   WBC 3.4 04/03/2021   HGB 12.9 04/03/2021   HCT 40.4 04/03/2021   MCV 91 04/03/2021   PLT 79 (LL) 04/03/2021   Lab Results  Component Value Date   NA 141 04/03/2021   K 4.4 04/03/2021   CO2 18 (L) 04/03/2021   GLUCOSE 203 (H) 04/03/2021   BUN 23 04/03/2021   CREATININE 1.33 (H) 04/03/2021   BILITOT 0.6 04/03/2021   ALKPHOS 57 04/03/2021   AST 30 04/03/2021   ALT 28 04/03/2021   PROT  6.2 04/03/2021   ALBUMIN 4.1 04/03/2021   CALCIUM 9.1 04/03/2021   ANIONGAP 10 02/28/2017   EGFR 40 (L) 04/03/2021   Lab Results  Component Value Date   CHOL 132 04/03/2021   Lab Results  Component Value Date   HDL 50 04/03/2021   Lab Results  Component Value Date   LDLCALC 59 04/03/2021   Lab Results  Component Value Date   TRIG 131 04/03/2021   Lab Results  Component Value Date   CHOLHDL 3.1 05/07/2019   Lab Results  Component Value Date   HGBA1C 6.0 04/03/2021       Assessment & Plan:   Problem List Items Addressed This Visit       Other   COVID-19 - Primary    Symptoms x7 days, positive home test 6 days ago on 05/08/21. She is out of the time frame for paxlovid, molnupiravir, and monoclonal antibodies. Encouraged fluids, rest, small bland meals. Will treat with prednisone taper. Educated it may make her blood sugar go up for a few days. She needs to isolate and wear a mask if she needs to go out in public for 3 more days. Follow-up if symptoms worsen or don't improve.          Meds ordered this encounter  Medications   predniSONE (DELTASONE) 10 MG tablet    Sig: Take 6 tablets today, then 5 tablets tomorrow, then decrease by 1 tablet every day until gone    Dispense:  21 tablet    Refill:  0    This visit was completed via telephone due to the restrictions of the COVID-19 pandemic. All issues  as above were discussed and addressed but no physical exam was performed. If it was felt that the patient should be evaluated in the office, they were directed there. The patient verbally consented to this visit. Patient was unable to complete an audio/visual visit due to Technical difficulties", "Lack of internet. Due to the catastrophic nature of the COVID-19 pandemic, this visit was done through audio contact only. Location of the patient: home Location of the provider: work Those involved with this call:  Provider: Vance Peper, DNP CMA: Louanna Raw,  CMA Front Desk/Registration: Barth Kirks  Time spent on call:  10 minutes on the phone discussing health concerns. 10 minutes total spent in review of patient's record and preparation of their chart.   Charyl Dancer, NP

## 2021-05-18 ENCOUNTER — Emergency Department: Payer: PPO

## 2021-05-18 ENCOUNTER — Encounter: Payer: Self-pay | Admitting: Emergency Medicine

## 2021-05-18 ENCOUNTER — Emergency Department
Admission: EM | Admit: 2021-05-18 | Discharge: 2021-05-18 | Disposition: A | Discharge status: Home / Self Care | Payer: PPO | Attending: Student in an Organized Health Care Education/Training Program | Admitting: Student in an Organized Health Care Education/Training Program

## 2021-05-18 ENCOUNTER — Other Ambulatory Visit: Payer: Self-pay

## 2021-05-18 DIAGNOSIS — Z8616 Personal history of COVID-19: Secondary | ICD-10-CM | POA: Diagnosis not present

## 2021-05-18 DIAGNOSIS — I129 Hypertensive chronic kidney disease with stage 1 through stage 4 chronic kidney disease, or unspecified chronic kidney disease: Secondary | ICD-10-CM | POA: Insufficient documentation

## 2021-05-18 DIAGNOSIS — R188 Other ascites: Secondary | ICD-10-CM | POA: Diagnosis not present

## 2021-05-18 DIAGNOSIS — Z794 Long term (current) use of insulin: Secondary | ICD-10-CM | POA: Diagnosis not present

## 2021-05-18 DIAGNOSIS — E1122 Type 2 diabetes mellitus with diabetic chronic kidney disease: Secondary | ICD-10-CM | POA: Insufficient documentation

## 2021-05-18 DIAGNOSIS — I2510 Atherosclerotic heart disease of native coronary artery without angina pectoris: Secondary | ICD-10-CM | POA: Insufficient documentation

## 2021-05-18 DIAGNOSIS — R0781 Pleurodynia: Secondary | ICD-10-CM | POA: Diagnosis present

## 2021-05-18 DIAGNOSIS — R0602 Shortness of breath: Secondary | ICD-10-CM | POA: Diagnosis not present

## 2021-05-18 DIAGNOSIS — N183 Chronic kidney disease, stage 3 unspecified: Secondary | ICD-10-CM | POA: Diagnosis not present

## 2021-05-18 DIAGNOSIS — E039 Hypothyroidism, unspecified: Secondary | ICD-10-CM | POA: Insufficient documentation

## 2021-05-18 DIAGNOSIS — U071 COVID-19: Secondary | ICD-10-CM | POA: Diagnosis not present

## 2021-05-18 DIAGNOSIS — Z96651 Presence of right artificial knee joint: Secondary | ICD-10-CM | POA: Insufficient documentation

## 2021-05-18 DIAGNOSIS — Z79899 Other long term (current) drug therapy: Secondary | ICD-10-CM | POA: Diagnosis not present

## 2021-05-18 DIAGNOSIS — Z853 Personal history of malignant neoplasm of breast: Secondary | ICD-10-CM | POA: Diagnosis not present

## 2021-05-18 LAB — CBC
HCT: 37.9 % (ref 36.0–46.0)
Hemoglobin: 12.9 g/dL (ref 12.0–15.0)
MCH: 30.6 pg (ref 26.0–34.0)
MCHC: 34 g/dL (ref 30.0–36.0)
MCV: 90 fL (ref 80.0–100.0)
Platelets: 100 10*3/uL — ABNORMAL LOW (ref 150–400)
RBC: 4.21 MIL/uL (ref 3.87–5.11)
RDW: 13.7 % (ref 11.5–15.5)
WBC: 6.7 10*3/uL (ref 4.0–10.5)
nRBC: 0 % (ref 0.0–0.2)

## 2021-05-18 LAB — COMPREHENSIVE METABOLIC PANEL
ALT: 27 U/L (ref 0–44)
AST: 34 U/L (ref 15–41)
Albumin: 3.3 g/dL — ABNORMAL LOW (ref 3.5–5.0)
Alkaline Phosphatase: 53 U/L (ref 38–126)
Anion gap: 10 (ref 5–15)
BUN: 25 mg/dL — ABNORMAL HIGH (ref 8–23)
CO2: 24 mmol/L (ref 22–32)
Calcium: 8.2 mg/dL — ABNORMAL LOW (ref 8.9–10.3)
Chloride: 104 mmol/L (ref 98–111)
Creatinine, Ser: 1.64 mg/dL — ABNORMAL HIGH (ref 0.44–1.00)
GFR, Estimated: 31 mL/min — ABNORMAL LOW (ref >60)
Glucose, Bld: 296 mg/dL — ABNORMAL HIGH (ref 70–99)
Potassium: 3.3 mmol/L — ABNORMAL LOW (ref 3.5–5.1)
Sodium: 138 mmol/L (ref 135–145)
Total Bilirubin: 1.1 mg/dL (ref 0.3–1.2)
Total Protein: 6.5 g/dL (ref 6.5–8.1)

## 2021-05-18 LAB — D-DIMER, QUANTITATIVE: D-Dimer, Quant: 3.45 ug/mL-FEU — ABNORMAL HIGH (ref 0.00–0.50)

## 2021-05-18 LAB — TROPONIN I (HIGH SENSITIVITY): Troponin I (High Sensitivity): 8 ng/L (ref <18)

## 2021-05-18 MED ORDER — SODIUM CHLORIDE 0.9 % IV BOLUS
500.0000 mL | Freq: Once | INTRAVENOUS | Status: AC
  Administered 2021-05-18: 500 mL via INTRAVENOUS

## 2021-05-18 MED ORDER — IOHEXOL 350 MG/ML SOLN
60.0000 mL | Freq: Once | INTRAVENOUS | Status: AC | PRN
  Administered 2021-05-18: 60 mL via INTRAVENOUS

## 2021-05-18 MED ORDER — ACETAMINOPHEN 325 MG PO TABS
650.0000 mg | ORAL_TABLET | Freq: Once | ORAL | Status: AC
  Administered 2021-05-18: 650 mg via ORAL
  Filled 2021-05-18: qty 2

## 2021-05-18 MED ORDER — ALBUTEROL SULFATE HFA 108 (90 BASE) MCG/ACT IN AERS
2.0000 | INHALATION_SPRAY | RESPIRATORY_TRACT | Status: DC | PRN
  Filled 2021-05-18: qty 6.7

## 2021-05-18 MED ORDER — ALBUTEROL SULFATE HFA 108 (90 BASE) MCG/ACT IN AERS: 2.0000 | INHALATION_SPRAY | Freq: Four times a day (QID) | RESPIRATORY_TRACT | 2 refills | Status: DC | PRN

## 2021-05-18 NOTE — ED Triage Notes (Signed)
Arrives from [facility] for ED evaluation of SOB.  Per handoff, patient is COVID positive.  Room air Sats 94%.  Patietn is AAOx3.  Skin warm and dry. No SOB/ DOE noted.

## 2021-05-18 NOTE — ED Provider Notes (Signed)
[facility] Emergency Department Provider Note    Event Date/Time   First MD Initiated Contact with Patient 05/18/21 1105     (approximate)  I have reviewed the triage vital signs and the nursing notes.   HISTORY  Chief Complaint No chief complaint on file.    HPI Julia Nielsen is a 80 y.o. female presents to the ER for evaluation of midsternal chest pain with pleuritic discomfort is well as dyspnea for the past several days.  She tested positive for COVID will more than a week ago.  Was put on prednisone.  Did not receive Plex elevated.  She is vaccinated.  No history of PE or DVT.  Does have a history of CAD.  Denies any pressure.  No nausea or vomiting.  No cough.  Past Medical History:  Diagnosis Date   Arthritis    OSTEO OF KNEE   Breast cancer ([place]) 2000   left breast cancer, radiation tx's.   CAD (coronary artery disease)    Carotid artery stenosis    bilateral   Cataract    Chronic kidney disease    Chronic stage 3   Coronary heart disease    Diabetes mellitus without complication (HCC)    Edema of both legs    GERD (gastroesophageal reflux disease)    Glaucoma    Hyperlipemia    mixed   Hyperlipidemia    Hypertension    Hypothyroidism    Mitral insufficiency    moderate   Osteopenia    Osteopenia    Personal history of radiation therapy 2000   LEFT lumpectomy   Stenosis of carotid artery    Thrombocytopenia (HCC)    Tricuspid insufficiency    moderate   Family History  Problem Relation Age of Onset   Gallbladder disease Mother    Aneurysm Mother    COPD Father    Congestive Heart Failure Father    Emphysema Father    Leukemia Sister    Stroke Brother    Dementia Brother    Alzheimer's disease Brother    Congestive Heart Failure Sister    Emphysema Sister    Breast cancer Neg Hx    Past Surgical History:  Procedure Laterality Date   APPENDECTOMY     BLADDER SURGERY     BREAST EXCISIONAL BIOPSY Left 2000    breast ca   BREAST LUMPECTOMY Left    BREAST SURGERY     CARDIAC CATHETERIZATION     CARPAL TUNNEL RELEASE Bilateral    CHOLECYSTECTOMY     COLONOSCOPY     COLONOSCOPY WITH PROPOFOL N/A 08/12/2018   Procedure: COLONOSCOPY WITH PROPOFOL;  Surgeon: Manya Silvas, MD;  Location: [facility] ENDOSCOPY;  Service: Endoscopy;  Laterality: N/A;   JOINT REPLACEMENT Right 2015   knee   parathyroid surgery     SHOULDER SURGERY Right    TUBAL LIGATION     Patient Active Problem List   Diagnosis Date Noted   COVID-19 05/14/2021   Atherosclerosis of aorta ([place]) 09/16/2019   Cirrhosis of liver without ascites ([place]) 09/16/2019   Renal cyst 09/16/2019   Osteoarthritis of knee 05/27/2018   Advanced care planning/counseling discussion 12/02/2017   Idiopathic thrombocytopenia ([place]) 02/10/2017   Bilateral leg edema 06/06/2016   Hypothyroidism 12/11/2015   Osteopenia 12/11/2015   Multiple respiratory allergies 12/11/2015   CAD (coronary artery disease) 07/05/2015   Type 2 diabetes mellitus with stage 3 chronic kidney disease ([place]) 07/05/2015   Benign hypertensive renal  disease 07/05/2015   Secondary hyperparathyroidism of renal origin ([place]) 07/05/2015   Anemia of chronic renal failure 07/05/2015   Hyperlipidemia associated with type 2 diabetes mellitus ([place]) 03/27/2015   Bilateral carotid artery stenosis 02/09/2015   CKD (chronic kidney disease) stage 3, GFR 30-59 ml/min (HCC) 10/11/2014   Moderate mitral insufficiency 10/11/2014   Moderate tricuspid insufficiency 10/11/2014      Prior to Admission medications   Medication Sig Start Date End Date Taking? Authorizing Provider  albuterol (VENTOLIN HFA) 108 (90 Base) MCG/ACT inhaler Inhale 2 puffs into the lungs every 6 (six) hours as needed for wheezing or shortness of breath. 05/18/21  Yes Merlyn Lot, MD  amLODipine (NORVASC) 10 MG tablet TAKE ONE TABLET BY MOUTH EVERY MORNING 03/19/21   Johnson, Megan P, DO  benazepril (LOTENSIN) 40 MG tablet  Take 1 tablet (40 mg total) by mouth daily. 04/03/21   Park Liter P, DO  Biotin w/ Vitamins C & E (HAIR/SKIN/NAILS PO) Take 1 tablet by mouth daily.    [provider]  brimonidine (ALPHAGAN) 0.2 % ophthalmic solution 1 drop 2 (two) times daily. 11/25/20   [provider]  carvedilol (COREG) 25 MG tablet Take 1 tablet (25 mg total) by mouth 2 (two) times daily with a meal. 04/03/21   Johnson, Megan P, DO  cloNIDine (CATAPRES) 0.1 MG tablet TAKE ONE TABLET BY MOUTH EVERY MORNING and TAKE ONE TABLET BY MOUTH EVERY EVENING 04/03/21   Johnson, Megan P, DO  dorzolamide-timolol (COSOPT) 22.3-6.8 MG/ML ophthalmic solution Place 1 drop into both eyes 2 (two) times daily.     [provider]  Dulaglutide (TRULICITY) 1.5 0000000 SOPN Inject 1.5 mg into the skin once a week. 04/03/21 07/02/21  Park Liter P, DO  fexofenadine (ALLEGRA) 180 MG tablet Take 180 mg by mouth daily.    [provider]  fluticasone (FLONASE) 50 MCG/ACT nasal spray Place 2 sprays into both nostrils daily. 04/03/21   Johnson, Megan P, DO  furosemide (LASIX) 20 MG tablet Take 20 mg by mouth daily.    [provider]  glucose blood (ONETOUCH ULTRA) test strip 1 each by Other route as needed for other. Use as instructed 12/27/20   Cannady, Henrine Screws T, NP  latanoprost (XALATAN) 0.005 % ophthalmic solution Place 1 drop into both eyes at bedtime.  01/17/17   [provider]  levothyroxine (SYNTHROID) 150 MCG tablet Take 1 tablet (150 mcg total) by mouth daily before breakfast. 04/03/20   Wynetta Emery, Megan P, DO  lovastatin (MEVACOR) 40 MG tablet Take 1 tablet (40 mg total) by mouth at bedtime. 03/15/21   Wynetta Emery, Megan P, DO  ONETOUCH DELICA LANCETS [id number] MISC USE AS DIRECTED ONCE DAILY WITH TEST STRIPS E11.22 06/16/18   Guadalupe Maple, MD  predniSONE (DELTASONE) 10 MG tablet Take 6 tablets today, then 5 tablets tomorrow, then decrease by 1 tablet every day until gone 05/14/21   McElwee, Scheryl Darter, NP     Allergies Patient has no known allergies.    Social History Social History   Tobacco Use   Smoking status: Never   Smokeless tobacco: Never  Vaping Use   Vaping Use: Never used  Substance Use Topics   Alcohol use: No   Drug use: No    Review of Systems Patient denies headaches, rhinorrhea, blurry vision, numbness, shortness of breath, chest pain, edema, cough, abdominal pain, nausea, vomiting, diarrhea, dysuria, fevers, rashes or hallucinations unless otherwise stated above in HPI. ____________________________________________   PHYSICAL EXAM:  VITAL SIGNS: Vitals:   05/18/21 0924 05/18/21 1145  BP: 133/65 (!) 143/55  Pulse: 91 70  Resp: (!) 22 (!) 22  Temp: 99.6 F (37.6 C)   SpO2: 95% 97%    Constitutional: Alert and oriented.  Eyes: Conjunctivae are normal.  Head: Atraumatic. Nose: No congestion/rhinnorhea. Mouth/Throat: Mucous membranes are moist.   Neck: No stridor. Painless ROM.  Cardiovascular: Normal rate, regular rhythm. Grossly normal heart sounds.  Good peripheral circulation. Respiratory: Normal respiratory effort.  No retractions. Lungs CTAB. Gastrointestinal: Soft and nontender. No distention. No abdominal bruits. No CVA tenderness. Genitourinary:  Musculoskeletal: No lower extremity tenderness nor edema.  No joint effusions. Neurologic:  Normal speech and language. No gross focal neurologic deficits are appreciated. No facial droop Skin:  Skin is warm, dry and intact. No rash noted. Psychiatric: Mood and affect are normal. Speech and behavior are normal.  ____________________________________________   LABS (all labs ordered are listed, but only abnormal results are displayed)  Results for orders placed or performed during the hospital encounter of 05/18/21 (from the past 24 hour(s))  CBC     Status: Abnormal   Collection Time: 05/18/21 11:14 AM  Result Value Ref Range   WBC 6.7 4.0 - 10.5 K/uL   RBC 4.21 3.87 - 5.11 MIL/uL   Hemoglobin  12.9 12.0 - 15.0 g/dL   HCT 37.9 36.0 - 46.0 %   MCV 90.0 80.0 - 100.0 fL   MCH 30.6 26.0 - 34.0 pg   MCHC 34.0 30.0 - 36.0 g/dL   RDW 13.7 11.5 - 15.5 %   Platelets 100 (L) 150 - 400 K/uL   nRBC 0.0 0.0 - 0.2 %  Comprehensive metabolic panel     Status: Abnormal   Collection Time: 05/18/21 11:14 AM  Result Value Ref Range   Sodium 138 135 - 145 mmol/L   Potassium 3.3 (L) 3.5 - 5.1 mmol/L   Chloride 104 98 - 111 mmol/L   CO2 24 22 - 32 mmol/L   Glucose, Bld 296 (H) 70 - 99 mg/dL   BUN 25 (H) 8 - 23 mg/dL   Creatinine, Ser 1.64 (H) 0.44 - 1.00 mg/dL   Calcium 8.2 (L) 8.9 - 10.3 mg/dL   Total Protein 6.5 6.5 - 8.1 g/dL   Albumin 3.3 (L) 3.5 - 5.0 g/dL   AST 34 15 - 41 U/L   ALT 27 0 - 44 U/L   Alkaline Phosphatase 53 38 - 126 U/L   Total Bilirubin 1.1 0.3 - 1.2 mg/dL   GFR, Estimated 31 (L) >60 mL/min   Anion gap 10 5 - 15  D-dimer, quantitative     Status: Abnormal   Collection Time: 05/18/21 11:14 AM  Result Value Ref Range   D-Dimer, Quant 3.45 (H) 0.00 - 0.50 ug/mL-FEU  Troponin I (High Sensitivity)     Status: None   Collection Time: 05/18/21 11:40 AM  Result Value Ref Range   Troponin I (High Sensitivity) 8 <18 ng/L   ____________________________________________  EKG My review and personal interpretation at Time: 9:11   Indication: sob  Rate: 90  Rhythm: sinus Axis: normal Other: normal intervals, no stemi, poor r wave progression ____________________________________________  RADIOLOGY  I personally reviewed all radiographic images ordered to evaluate for the above acute complaints and reviewed radiology reports and findings.  These findings were personally discussed with the patient.  Please see medical record for radiology report.  ____________________________________________   PROCEDURES  Procedure(s) performed:  Procedures    Critical  Care performed: no ____________________________________________   INITIAL IMPRESSION / ASSESSMENT AND PLAN / ED  COURSE  Pertinent labs & imaging results that were available during my care of the patient were reviewed by me and considered in my medical decision making (see chart for details).   DDX: COVID, pneumonia, ACS, PE  Julia Nielsen is a 80 y.o. who presents to the ED with presentation as described above.  Patient nontoxic-appearing complaining of shortness of breath with recent COVID-19 diagnosis.  No sign of clear infiltrate or edema on chest x-ray.  Blood work reassuring.  Will add on D-dimer to further stratify for PE.  Do not appreciate any significant wheezing on exam we will give gentle IV hydration.  Clinical Course as of 05/18/21 1449  Fri May 18, 2021  1221 D-dimer is elevated will order CTA to evaluate for PE [PR]  1438 CTA without evidence of PE does show findings consistent with known diagnosis of COVID-19 pneumonia she is not hypoxic able to ambulate without any dyspnea or hypoxia.  Feel some improvement with her albuterol inhaler.  Will discharge with the same.  She is also the window for Paxil bit.  We discussed signs and symptoms which she should return to the ER. [PR]    Clinical Course User Index [PR] Merlyn Lot, MD    The patient was evaluated in Emergency Department today for the symptoms described in the history of present illness. He/she was evaluated in the context of the global COVID-19 pandemic, which necessitated consideration that the patient might be at risk for infection with the SARS-CoV-2 virus that causes COVID-19. Institutional protocols and algorithms that pertain to the evaluation of patients at risk for COVID-19 are in a state of rapid change based on information released by regulatory bodies including the CDC and federal and state organizations. These policies and algorithms were followed during the patient's care in the ED.  As part of my medical decision making, I reviewed the following data within the [place] notes reviewed  and incorporated, Labs reviewed, notes from prior ED visits and [place] Controlled Substance Database   ____________________________________________   FINAL CLINICAL IMPRESSION(S) / ED DIAGNOSES  Final diagnoses:  Shortness of breath  COVID-19      NEW MEDICATIONS STARTED DURING THIS VISIT:  New Prescriptions   ALBUTEROL (VENTOLIN HFA) 108 (90 BASE) MCG/ACT INHALER    Inhale 2 puffs into the lungs every 6 (six) hours as needed for wheezing or shortness of breath.     Note:  This document was prepared using Dragon voice recognition software and may include unintentional dictation errors.    Merlyn Lot, MD 05/18/21 [phone]

## 2021-05-18 NOTE — ED Notes (Signed)
Pt given warm blanket.

## 2021-06-09 ENCOUNTER — Other Ambulatory Visit: Payer: Self-pay | Admitting: Family Medicine

## 2021-06-09 NOTE — Telephone Encounter (Signed)
Requested Prescriptions  Pending Prescriptions Disp Refills  . amLODipine (NORVASC) 10 MG tablet [Pharmacy Med Name: amlodipine 10 mg tablet] 90 tablet 0    Sig: TAKE ONE TABLET BY MOUTH EVERY MORNING     Cardiovascular:  Calcium Channel Blockers Failed - 06/09/2021  8:02 AM      Failed - Last BP in normal range    BP Readings from Last 1 Encounters:  05/18/21 (!) 148/57         Passed - Valid encounter within last 6 months    Recent Outpatient Visits          3 weeks ago Stella, NP   2 months ago Benign hypertensive renal disease   [facility] [place], Megan P, DO   8 months ago Routine general medical examination at a health care facility   [facility], Megan P, DO   10 months ago Other conjunctivitis of right eye   [place], NP   11 months ago Flu vaccine need   [facility] Kathrine Haddock, NP      Future Appointments            In 4 months Wynetta Emery, Barb Merino, DO [employer], [place]   In 42 months  [employer], [place]

## 2021-06-19 ENCOUNTER — Other Ambulatory Visit: Payer: Self-pay | Admitting: Family Medicine

## 2021-06-19 DIAGNOSIS — J309 Allergic rhinitis, unspecified: Secondary | ICD-10-CM

## 2021-07-02 ENCOUNTER — Telehealth: Payer: Self-pay

## 2021-07-02 NOTE — Chronic Care Management (AMB) (Signed)
Chronic Care Management Pharmacy Assistant   Name: Julia Nielsen  MRN: [id number] DOB: [birth year]  Reason for Encounter: Disease State Hypertension  Recent office visits:  05/14/21-Lauren Avelina Laine, NP. Seen for a cough. Start on Prednisone 10 mg tab. Return if symptoms worsen or fail to improve 04/03/21-Megan P. Johnson, DO. Seen for general follow up. Labs ordered. Follow up in 6 months.  Recent consult visits:  04/30/21-Bruce Lenn Sink, MD (Cardiology) Seen for SOB, hypertension, leg swelling Follow up in 9 months. 04/09/21-Bruce Kelli Hope, (Cardiology) Notes not available. 03/15/21-Bruce Lenn Sink, DO (Cardiology) Seen for general follow up. EKG completed. 02/05/21-Sarath Kolluru, MD (Nephrology) Seen for follow up visit. Ultrasound renal completed. Labs ordered. Follow up in 6 months. 01/01/21-Steven Dingeldein (Ophthalmology) Notes not available.  Hospital visits:  Medication Reconciliation was completed by comparing discharge summary, patient's EMR and Pharmacy list, and upon discussion with patient.  Admitted to the hospital on 05/18/21 due to Shortness of breath. Discharge date was 05/18/21. Discharged from [place]?Medications Started at [facility] Discharge:?? -started on ALBUTEROL (VENTOLIN HFA) 108 (90 BASE) MCG/ACT INHALER Inhale 2 puffs into the lungs every 6 (six) hours as needed for wheezing or shortness of breath.  Medication Changes at Hospital Discharge: -Changed None noted  Medications Discontinued at Hospital Discharge: -Stopped None noted  Medications that remain the same after Hospital Discharge:??  -All other medications will remain the same.    Medications: Outpatient Encounter Medications as of 07/02/2021  Medication Sig   albuterol (VENTOLIN HFA) 108 (90 Base) MCG/ACT inhaler Inhale 2 puffs into the lungs every 6 (six) hours as needed for wheezing or shortness of breath.   amLODipine (NORVASC) 10 MG  tablet TAKE ONE TABLET BY MOUTH EVERY MORNING   benazepril (LOTENSIN) 40 MG tablet Take 1 tablet (40 mg total) by mouth daily.   Biotin w/ Vitamins C & E (HAIR/SKIN/NAILS PO) Take 1 tablet by mouth daily.   brimonidine (ALPHAGAN) 0.2 % ophthalmic solution 1 drop 2 (two) times daily.   carvedilol (COREG) 25 MG tablet Take 1 tablet (25 mg total) by mouth 2 (two) times daily with a meal.   cloNIDine (CATAPRES) 0.1 MG tablet TAKE ONE TABLET BY MOUTH EVERY MORNING and TAKE ONE TABLET BY MOUTH EVERY EVENING   dorzolamide-timolol (COSOPT) 22.3-6.8 MG/ML ophthalmic solution Place 1 drop into both eyes 2 (two) times daily.    Dulaglutide (TRULICITY) 1.5 LN/9.8XQ SOPN Inject 1.5 mg into the skin once a week.   fexofenadine (ALLEGRA) 180 MG tablet Take 180 mg by mouth daily.   fluticasone (FLONASE) 50 MCG/ACT nasal spray Place 2 sprays into both nostrils daily.   furosemide (LASIX) 20 MG tablet Take 20 mg by mouth daily.   glucose blood (ONETOUCH ULTRA) test strip 1 each by Other route as needed for other. Use as instructed   latanoprost (XALATAN) 0.005 % ophthalmic solution Place 1 drop into both eyes at bedtime.    levothyroxine (SYNTHROID) 150 MCG tablet Take 1 tablet (150 mcg total) by mouth daily before breakfast.   lovastatin (MEVACOR) 40 MG tablet Take 1 tablet (40 mg total) by mouth at bedtime.   ONETOUCH DELICA LANCETS 11H MISC USE AS DIRECTED ONCE DAILY WITH TEST STRIPS E11.22   predniSONE (DELTASONE) 10 MG tablet Take 6 tablets today, then 5 tablets tomorrow, then decrease by 1 tablet every day until gone   No facility-administered encounter medications on file as of 07/02/2021.   Current antihypertensive regimen:  Amlodipine 10 mg take 1 tablet daily Carvedilol 25 mg take 1 tab twice daily Benazepril 40 mg take 1 tab daily   How often are you checking your Blood Pressure? 1-2x per week  Current home BP readings:         125/62  What recent interventions/DTPs have been made by any  provider to improve Blood Pressure control since last CPP Visit: None noted  Any recent hospitalizations or ED visits since last visit with CPP? Yes  What diet changes have been made to improve Blood Pressure Control?  Patient states she has been eating about the same meals  What exercise is being done to improve your Blood Pressure Control?  Patient states she does use her power walker to get some exercising in.  Adherence Review: Is the patient currently on ACE/ARB medication? Yes Does the patient have >5 day gap between last estimated fill dates? No  Patient has been scheduled for an appointment with CPP Madelin Rear on 08/27/21 @2pm .   Star Rating Drugs: Trulciity 1.5 mg Last filled:04/03/20 84 DS Benazepril 40 mg Last filled:06/04/21 90 DS Lovastatin 40 mg Last filled:06/04/21 90 DS  Care Gaps Foot exam:Overdue since 09/15/2020  COVID-19 Vaccine:Last completed: Oct 10, 2020 INFLUENZA VACCINE:Last completed: Jul 13, 2020  [name], [place]

## 2021-07-09 DIAGNOSIS — H401132 Primary open-angle glaucoma, bilateral, moderate stage: Secondary | ICD-10-CM | POA: Diagnosis not present

## 2021-07-09 LAB — HM DIABETES EYE EXAM

## 2021-07-24 ENCOUNTER — Other Ambulatory Visit: Payer: Self-pay | Admitting: Family Medicine

## 2021-07-24 DIAGNOSIS — E039 Hypothyroidism, unspecified: Secondary | ICD-10-CM

## 2021-07-24 NOTE — Telephone Encounter (Signed)
Requested Prescriptions  Pending Prescriptions Disp Refills  . levothyroxine (SYNTHROID) 150 MCG tablet [Pharmacy Med Name: levothyroxine 150 mcg tablet] 90 tablet 0    Sig: TAKE ONE TABLET BY MOUTH BEFORE BREAKFAST     Endocrinology:  Hypothyroid Agents Failed - 07/24/2021 10:27 AM      Failed - TSH needs to be rechecked within 3 months after an abnormal result. Refill until TSH is due.      Passed - TSH in normal range and within 360 days    TSH  Date Value Ref Range Status  04/03/2021 1.650 0.450 - 4.500 uIU/mL Final         Passed - Valid encounter within last 12 months    Recent Outpatient Visits          2 months ago [place], Lauren A, NP   3 months ago Benign hypertensive renal disease   [facility] [place], Megan P, DO   9 months ago Routine general medical examination at a health care facility   [facility], Megan P, DO   12 months ago Other conjunctivitis of right eye   [facility] Kathrine Haddock, NP   1 year ago Flu vaccine need   [facility] Kathrine Haddock, NP      Future Appointments            In 2 months Wynetta Emery, Barb Merino, DO [place], [place]   In 9 months  [employer], [place]

## 2021-08-13 ENCOUNTER — Other Ambulatory Visit: Payer: Self-pay | Admitting: Family Medicine

## 2021-08-13 DIAGNOSIS — N2581 Secondary hyperparathyroidism of renal origin: Secondary | ICD-10-CM | POA: Diagnosis not present

## 2021-08-13 DIAGNOSIS — E1122 Type 2 diabetes mellitus with diabetic chronic kidney disease: Secondary | ICD-10-CM | POA: Diagnosis not present

## 2021-08-13 DIAGNOSIS — N2889 Other specified disorders of kidney and ureter: Secondary | ICD-10-CM | POA: Diagnosis not present

## 2021-08-13 DIAGNOSIS — I129 Hypertensive chronic kidney disease with stage 1 through stage 4 chronic kidney disease, or unspecified chronic kidney disease: Secondary | ICD-10-CM | POA: Diagnosis not present

## 2021-08-13 DIAGNOSIS — R809 Proteinuria, unspecified: Secondary | ICD-10-CM | POA: Diagnosis not present

## 2021-08-13 DIAGNOSIS — N1832 Chronic kidney disease, stage 3b: Secondary | ICD-10-CM | POA: Diagnosis not present

## 2021-08-13 DIAGNOSIS — D631 Anemia in chronic kidney disease: Secondary | ICD-10-CM | POA: Diagnosis not present

## 2021-08-13 DIAGNOSIS — Z1231 Encounter for screening mammogram for malignant neoplasm of breast: Secondary | ICD-10-CM

## 2021-08-13 DIAGNOSIS — E876 Hypokalemia: Secondary | ICD-10-CM | POA: Diagnosis not present

## 2021-08-15 DIAGNOSIS — H16223 Keratoconjunctivitis sicca, not specified as Sjogren's, bilateral: Secondary | ICD-10-CM | POA: Diagnosis not present

## 2021-08-21 ENCOUNTER — Telehealth: Payer: Self-pay

## 2021-08-21 NOTE — Chronic Care Management (AMB) (Signed)
Chronic Care Management Pharmacy Assistant   Name: Julia Nielsen  MRN: [id number] DOB: 1941-05-22  Reason for Encounter: Medication coordination call    Medications: Outpatient Encounter Medications as of 08/21/2021  Medication Sig   albuterol (VENTOLIN HFA) 108 (90 Base) MCG/ACT inhaler Inhale 2 puffs into the lungs every 6 (six) hours as needed for wheezing or shortness of breath.   amLODipine (NORVASC) 10 MG tablet TAKE ONE TABLET BY MOUTH EVERY MORNING   benazepril (LOTENSIN) 40 MG tablet Take 1 tablet (40 mg total) by mouth daily.   Biotin w/ Vitamins C & E (HAIR/SKIN/NAILS PO) Take 1 tablet by mouth daily.   brimonidine (ALPHAGAN) 0.2 % ophthalmic solution 1 drop 2 (two) times daily.   carvedilol (COREG) 25 MG tablet Take 1 tablet (25 mg total) by mouth 2 (two) times daily with a meal.   cloNIDine (CATAPRES) 0.1 MG tablet TAKE ONE TABLET BY MOUTH EVERY MORNING and TAKE ONE TABLET BY MOUTH EVERY EVENING   dorzolamide-timolol (COSOPT) 22.3-6.8 MG/ML ophthalmic solution Place 1 drop into both eyes 2 (two) times daily.    fexofenadine (ALLEGRA) 180 MG tablet Take 180 mg by mouth daily.   fluticasone (FLONASE) 50 MCG/ACT nasal spray Place 2 sprays into both nostrils daily.   furosemide (LASIX) 20 MG tablet Take 20 mg by mouth daily.   glucose blood (ONETOUCH ULTRA) test strip 1 each by Other route as needed for other. Use as instructed   latanoprost (XALATAN) 0.005 % ophthalmic solution Place 1 drop into both eyes at bedtime.    levothyroxine (SYNTHROID) 150 MCG tablet TAKE ONE TABLET BY MOUTH BEFORE BREAKFAST   lovastatin (MEVACOR) 40 MG tablet Take 1 tablet (40 mg total) by mouth at bedtime.   ONETOUCH DELICA LANCETS 08X MISC USE AS DIRECTED ONCE DAILY WITH TEST STRIPS E11.22   predniSONE (DELTASONE) 10 MG tablet Take 6 tablets today, then 5 tablets tomorrow, then decrease by 1 tablet every day until gone   No facility-administered encounter medications on file as of 08/21/2021.     Reviewed chart for medication changes ahead of medication coordination call.  No OVs, Consults, or hospital visits since last care coordination call/Pharmacist visit. (If appropriate, list visit date, provider name)  No medication changes indicated OR if recent visit, treatment plan here.  BP Readings from Last 3 Encounters:  05/18/21 (!) 148/57  04/03/21 114/70  10/04/20 130/70    Lab Results  Component Value Date   HGBA1C 6.0 04/03/2021     Patient obtains medications through Adherence Packaging  90 Days   Last adherence delivery included:  Brimonidine 1 drop twice daily each eye 0.1% solution Latanoprost 0.5% solution 1 each eye at bedtime One touch blood glucose test strips  Amlodipine 10 mg take 1 tab daily Benazepril 40 mg take 1 tab daily Carvedilol 25 mg take 1 tab twice daily with meal Clonidine 0.1 mg take 1 tab twice daily Furosemide 20 mg take 1 tab daily Levothyroxine 150 mcg take 1 tab daily before breakfast Dorzolamide-Timolol 22.3/6.8 Drops place 1 drop on both eyes twice daily  Patient is due for next adherence delivery on: 08/29/21. Called patient and reviewed medications and coordinated delivery.  This delivery to include: Levothyroxine 150 mcg once daily Amlodipine 10 mg once daily Benazepril 40 mg once daily Carvedilol 25 mg twice daily Clonidine 0.1 mg twice daily Furosemide 20 mg once daily Lovastatin 40 mg once daily Brimonidine drop 0.20% one drop in each eye twice daily dorzolamide-timolol (COSOPT) 22.3-6.8  MG/ML ophthalmic solution 1 drop in both eyes twice daily latanoprost (XALATAN) 0.005 % ophthalmic solution Place 1 drop into both eyes at bedtime Lancets and test stips One touch   Confirmed delivery date of 08/29/21, advised patient that pharmacy will contact them the morning of delivery.  [name], [place]

## 2021-08-21 NOTE — Addendum Note (Signed)
Addended by: Madelin Rear on: 08/21/2021 03:13 PM   Modules accepted: Miquel Dunn

## 2021-08-23 ENCOUNTER — Other Ambulatory Visit: Payer: Self-pay | Admitting: Family Medicine

## 2021-08-23 DIAGNOSIS — E039 Hypothyroidism, unspecified: Secondary | ICD-10-CM

## 2021-08-23 NOTE — Telephone Encounter (Signed)
Requested Prescriptions  Pending Prescriptions Disp Refills  . amLODipine (NORVASC) 10 MG tablet [Pharmacy Med Name: amlodipine 10 mg tablet] 90 tablet 0    Sig: TAKE ONE TABLET BY MOUTH EVERY MORNING     Cardiovascular:  Calcium Channel Blockers Failed - 08/23/2021  8:03 AM      Failed - Last BP in normal range    BP Readings from Last 1 Encounters:  05/18/21 (!) 148/57         Passed - Valid encounter within last 6 months    Recent Outpatient Visits          3 months ago [place], Lauren A, NP   4 months ago Benign hypertensive renal disease   [facility] [place], Megan P, DO   10 months ago Routine general medical examination at a health care facility   [place], [place], DO   1 year ago Other conjunctivitis of right eye   [facility] Kathrine Haddock, NP   1 year ago Flu vaccine need   [facility] Kathrine Haddock, NP      Future Appointments            In 1 month Wynetta Emery, Barb Merino, DO [place], [facility]   In 8 months  [employer], [facility]           Refused Prescriptions Disp Refills  . lovastatin (MEVACOR) 40 MG tablet [Pharmacy Med Name: lovastatin 40 mg tablet] 90 tablet 1    Sig: TAKE ONE TABLET BY MOUTH EVERYDAY AT BEDTIME     Cardiovascular:  Antilipid - Statins Passed - 08/23/2021  8:03 AM      Passed - Total Cholesterol in normal range and within 360 days    Cholesterol, Total  Date Value Ref Range Status  04/03/2021 132 100 - 199 mg/dL Final   Cholesterol [place], Waived  Date Value Ref Range Status  10/29/2018 144 <200 mg/dL Final    Comment:                            Desirable                <200                         Borderline High      200- 239                         High                     >239          Passed - LDL in normal range and within 360 days    LDL Chol Calc (NIH)  Date Value Ref Range Status  04/03/2021 59 0 -  99 mg/dL Final         Passed - HDL in normal range and within 360 days    HDL  Date Value Ref Range Status  04/03/2021 50 >39 mg/dL Final         Passed - Triglycerides in normal range and within 360 days    Triglycerides  Date Value Ref Range Status  04/03/2021 131 0 - 149 mg/dL Final   Triglycerides Piccolo,Waived  Date Value Ref Range Status  10/29/2018 146 <150 mg/dL Final  Comment:                            Normal                   <150                         Borderline High     150 - 199                         High                200 - 499                         Very High                >499          Passed - Patient is not pregnant      Passed - Valid encounter within last 12 months    Recent Outpatient Visits          3 months ago [place], NP   4 months ago Benign hypertensive renal disease   [facility] [place], Megan P, DO   10 months ago Routine general medical examination at a health care facility   [facility], [place], DO   1 year ago Other conjunctivitis of right eye   [facility] Kathrine Haddock, NP   1 year ago Flu vaccine need   [facility] Kathrine Haddock, NP      Future Appointments            In 1 month Johnson, Megan P, DO [place], [facility]   In 8 months  [employer], [facility]           . levothyroxine (SYNTHROID) 150 MCG tablet [employer] Med Name: levothyroxine 150 mcg tablet] 90 tablet 0    Sig: TAKE ONE TABLET BY MOUTH BEFORE BREAKFAST     Endocrinology:  Hypothyroid Agents Failed - 08/23/2021  8:03 AM      Failed - TSH needs to be rechecked within 3 months after an abnormal result. Refill until TSH is due.      Passed - TSH in normal range and within 360 days    TSH  Date Value Ref Range Status  04/03/2021 1.650 0.450 - 4.500 uIU/mL Final         Passed - Valid encounter within last 12 months     Recent Outpatient Visits          3 months ago [place], Lauren A, NP   4 months ago Benign hypertensive renal disease   [facility] [place], Megan P, DO   10 months ago Routine general medical examination at a health care facility   [place], [place], DO   1 year ago Other conjunctivitis of right eye   [facility] Kathrine Haddock, NP   1 year ago Flu vaccine need   [facility] Kathrine Haddock, NP      Future Appointments            In 1 month Johnson, Barb Merino, DO [place], [place]   In 8 months  [facility], [facility]

## 2021-08-24 ENCOUNTER — Telehealth: Payer: Self-pay

## 2021-08-24 NOTE — Chronic Care Management (AMB) (Signed)
    Chronic Care Management Pharmacy Assistant   Name: Julia Nielsen  MRN: [id number] DOB: 1941/02/13  Reason for Encounter: Patient assistance renewal form 2023  Attempted to call patient and inform her that I sent out out a prefilled 2023 renewal patient assistance form for [employer]. I left a voicemail and attached instructions for the form as well as my contact information.      Andee Poles, CMA

## 2021-08-27 ENCOUNTER — Ambulatory Visit: Payer: PPO

## 2021-08-27 VITALS — BP 120/67

## 2021-08-27 DIAGNOSIS — I129 Hypertensive chronic kidney disease with stage 1 through stage 4 chronic kidney disease, or unspecified chronic kidney disease: Secondary | ICD-10-CM

## 2021-08-27 DIAGNOSIS — E1122 Type 2 diabetes mellitus with diabetic chronic kidney disease: Secondary | ICD-10-CM

## 2021-08-27 NOTE — Progress Notes (Signed)
Chronic Care Management Pharmacy Note  08/27/2021 Name:  Julia Nielsen MRN:  [id number] DOB:  08-Mar-1941  Summary: No Rx changes, BP at goal per home readings reported. Pt to bring in re-enrollment on trulicity 1.5 mg injection once weekly through [facility].   Subjective: Julia Nielsen is an 80 y.o. year old female who is a primary patient of Valerie Roys, DO.  The CCM team was consulted for assistance with disease management and care coordination needs.    Engaged with patient by telephone for follow up visit in response to provider referral for pharmacy case management and/or care coordination services.   Consent to Services:  The patient was given information about Chronic Care Management services, agreed to services, and gave verbal consent prior to initiation of services.  Please see initial visit note for detailed documentation.   Patient Care Team: Valerie Roys, DO as PCP - General (Family Medicine) Vladimir Faster, [facility] as Pharmacist (Pharmacist)  Hospital visits: None in previous 6 months  Objective:  Lab Results  Component Value Date   CREATININE 1.64 (H) 05/18/2021   CREATININE 1.33 (H) 04/03/2021   CREATININE 1.26 (H) 10/04/2020    Lab Results  Component Value Date   HGBA1C 6.0 04/03/2021   HGBA1C 6.3 10/04/2020   HGBA1C 6.3 04/03/2020   Last diabetic Eye exam:  Lab Results  Component Value Date/Time   HMDIABEYEEXA No Retinopathy 07/09/2021 12:00 AM    Last diabetic Foot exam: No results found for: HMDIABFOOTEX      Component Value Date/Time   CHOL 132 04/03/2021 0923   CHOL 139 10/04/2020 0820   CHOL 142 12/20/2019 0851   CHOL 144 10/29/2018 0830   CHOL 141 08/27/2017 0920   CHOL 127 06/18/2016 0927   TRIG 131 04/03/2021 0923   TRIG 181 (H) 10/04/2020 0820   TRIG 204 (H) 12/20/2019 0851   TRIG 146 10/29/2018 0830   TRIG 162 (H) 08/27/2017 0920   TRIG 161 (H) 06/18/2016 0927   HDL 50 04/03/2021 0923   HDL 50 10/04/2020 0820   HDL  47 12/20/2019 0851   CHOLHDL 3.1 05/07/2019 0939   VLDL 29 10/29/2018 0830   LDLCALC 59 04/03/2021 0923   LDLCALC 59 10/04/2020 0820   LDLCALC 62 12/20/2019 0851    Hepatic Function Latest Ref Rng & Units 05/18/2021 04/03/2021 10/04/2020  Total Protein 6.5 - 8.1 g/dL 6.5 6.2 6.5  Albumin 3.5 - 5.0 g/dL 3.3(L) 4.1 4.2  AST 15 - 41 U/L 34 30 23  ALT 0 - 44 U/L _0 Alk Phosphatase 38 - 126 U/L 53 57 51  Total Bilirubin 0.3 - 1.2 mg/dL 1.1 0.6 0.8    Lab Results  Component Value Date/Time   TSH 1.650 04/03/2021 09:23 AM   TSH 2.060 10/04/2020 08:20 AM    CBC Latest Ref Rng & Units 05/18/2021 04/03/2021 10/04/2020  WBC 4.0 - 10.5 K/uL 6.7 3.4 4.0  Hemoglobin 12.0 - 15.0 g/dL 12.9 12.9 13.4  Hematocrit 36.0 - 46.0 % 37.9 40.4 41.7  Platelets 150 - 400 K/uL 100(L) 79(LL) 83(LL)    Lab Results  Component Value Date/Time   VD25OH 43.1 04/03/2021 09:23 AM    Clinical ASCVD:  The ASCVD Risk score (Arnett DK, et al., 2019) failed to calculate for the following reasons:   The 2019 ASCVD risk score is only valid for ages 32 to 64   Social History   Tobacco Use  Smoking Status Never  Smokeless Tobacco Never   BP Readings from Last 3 Encounters:  08/27/21 120/67  05/18/21 (!) 148/57  04/03/21 114/70   Pulse Readings from Last 3 Encounters:  05/18/21 70  04/03/21 78  10/04/20 88   Wt Readings from Last 3 Encounters:  05/18/21 170 lb 13.7 oz (77.5 kg)  05/04/21 171 lb (77.6 kg)  04/03/21 173 lb (78.5 kg)   BMI Readings from Last 3 Encounters:  05/18/21 31.25 kg/m  05/04/21 31.28 kg/m  04/03/21 32.69 kg/m    Assessment: Review of patient past medical history, allergies, medications, health status, including review of consultants reports, laboratory and other test data, was performed as part of comprehensive evaluation and provision of chronic care management services.   SDOH:  (Social Determinants of Health) assessments and interventions performed: Yes   CCM Care  Plan  No Known Allergies  Medications Reviewed Today     Reviewed by Charyl Dancer, NP (Nurse Practitioner) on 05/14/21 at [place] List Status: <None>   Medication Order Taking? Sig Documenting Provider Last Dose Status Informant  amLODipine (NORVASC) 10 MG tablet [id number] Yes TAKE ONE TABLET BY MOUTH EVERY MORNING Johnson, Megan P, DO Taking Active   benazepril (LOTENSIN) 40 MG tablet [id number] Yes Take 1 tablet (40 mg total) by mouth daily. Park Liter P, DO Taking Active   Biotin w/ Vitamins C & E (HAIR/SKIN/NAILS PO) [id number] Yes Take 1 tablet by mouth daily. [provider] Taking Active   brimonidine (ALPHAGAN) 0.2 % ophthalmic solution [id number] Yes 1 drop 2 (two) times daily. [provider] Taking Active   carvedilol (COREG) 25 MG tablet [id number] Yes Take 1 tablet (25 mg total) by mouth 2 (two) times daily with a meal. Wynetta Emery, Megan P, DO Taking Active   cloNIDine (CATAPRES) 0.1 MG tablet [id number] Yes TAKE ONE TABLET BY MOUTH EVERY MORNING and TAKE ONE TABLET BY MOUTH EVERY EVENING Johnson, Megan P, DO Taking Active   dorzolamide-timolol (COSOPT) 22.3-6.8 MG/ML ophthalmic solution [id number] Yes Place 1 drop into both eyes 2 (two) times daily.  [provider] Taking Active   Dulaglutide (TRULICITY) 1.5 JM/4.2AS SOPN [id number] Yes Inject 1.5 mg into the skin once a week. Johnson, Megan P, DO Taking Active   fexofenadine (ALLEGRA) 180 MG tablet [id number] Yes Take 180 mg by mouth daily. [provider] Taking Active   fluticasone (FLONASE) 50 MCG/ACT nasal spray [id number] Yes Place 2 sprays into both nostrils daily. Johnson, Megan P, DO Taking Active   furosemide (LASIX) 20 MG tablet [id number] Yes Take 20 mg by mouth daily. [provider] Taking Active   glucose blood (ONETOUCH ULTRA) test strip [id number] Yes 1 each by Other route as needed for other. Use as instructed Cannady, Henrine Screws T, NP Taking Active   latanoprost (XALATAN)  0.005 % ophthalmic solution [id number] Yes Place 1 drop into both eyes at bedtime.  [provider] Taking Active   levothyroxine (SYNTHROID) 150 MCG tablet [id number] Yes Take 1 tablet (150 mcg total) by mouth daily before breakfast. Park Liter P, DO Taking Active   lovastatin (MEVACOR) 40 MG tablet [id number] Yes Take 1 tablet (40 mg total) by mouth at bedtime. Valerie Roys, DO Taking Active   [facility] DELICA LANCETS [address] [place] [id number] Yes USE AS DIRECTED ONCE DAILY WITH TEST STRIPS E11.22 Guadalupe Maple, MD Taking Active   predniSONE (DELTASONE) 10 MG tablet [id number] Yes Take 6 tablets today, then 5 tablets tomorrow, then decrease by 1 tablet every day until gone  Charyl Dancer, NP  Active             Patient Active Problem List   Diagnosis Date Noted   COVID-19 05/14/2021   Atherosclerosis of aorta ([place]) 09/16/2019   Cirrhosis of liver without ascites ([place]) 09/16/2019   Renal cyst 09/16/2019   Osteoarthritis of knee 05/27/2018   Advanced care planning/counseling discussion 12/02/2017   Idiopathic thrombocytopenia ([place]) 02/10/2017   Bilateral leg edema 06/06/2016   Hypothyroidism 12/11/2015   Osteopenia 12/11/2015   Multiple respiratory allergies 12/11/2015   CAD (coronary artery disease) 07/05/2015   Type 2 diabetes mellitus with stage 3 chronic kidney disease ([place]) 07/05/2015   Benign hypertensive renal disease 07/05/2015   Secondary hyperparathyroidism of renal origin ([place]) 07/05/2015   Anemia of chronic renal failure 07/05/2015   Hyperlipidemia associated with type 2 diabetes mellitus ([place]) 03/27/2015   Bilateral carotid artery stenosis 02/09/2015   CKD (chronic kidney disease) stage 3, GFR 30-59 ml/min (HCC) 10/11/2014   Moderate mitral insufficiency 10/11/2014   Moderate tricuspid insufficiency 10/11/2014   Immunization History  Administered Date(s) Administered   Fluad Quad(high Dose 65+) 06/22/2019, 07/13/2020   Influenza, High Dose Seasonal PF  06/18/2016, 08/27/2017, 08/06/2018, 08/23/2021   Influenza,inj,Quad PF,6+ Mos 07/05/2015   Moderna Sars-Covid-2 Vaccination 02/29/2020, 03/28/2020, 10/10/2020   PNEUMOCOCCAL CONJUGATE-20 08/23/2021   Pneumococcal Conjugate-13 12/08/2014   Pneumococcal-Unspecified 08/15/1999, 06/18/2005   Td 01/23/2009, 10/04/2020   Conditions to be addressed/monitored: HLD HTN CAD T2DM  Care Plan : CCm Pharmacy Care Plan  Updates made by Madelin Rear, [facility] since 08/27/2021 12:00 AM     Problem: HTN, CKD3, CAD, HLD, DM@, hypothyroidism, Secondary parahyperthyroidism, osteopenia   Priority: High     Long-Range Goal: Disease Management   Start Date: 01/09/2021  This Visit's Progress: On track  Recent Progress: On track  Priority: High  Note:   Hypertension (BP goal <140/90) -Controlled -Current treatment: Amlodipine 10 mg once daily Benazepril 40 mg once daily Carvedilol 25 mg twice daily Clonidine 0.1 mg twice daily Furosemide 20 mg once daily -Current home readings: <130/80. Reported 120/67 for home reading  -Current dietary habits: portion control  -Current exercise habits: pedal machine 20-30 min just about everyday  -Denies hypotensive/hypertensive symptoms -Educated on Importance of home blood pressure monitoring; -Counseled to monitor BP at home 1-2x/wk, document, and provide log at future appointments -Counseled on diet and exercise extensively Recommended to continue current medication  Diabetes (A1c goal <7%) -Controlled -Current medications: Trulicity 1.5 mg once weekly  -Current home glucose readings: 120s -Denies hypoglycemic/hyperglycemic symptoms -Current meal patterns:  -Educated on A1c and blood sugar goals; Exercise goal of 150 minutes per week; -Counseled to check feet daily and get yearly eye exams -Counseled on diet and exercise extensively Recommended to continue current medication Patient approved for 4709 Trulicity PAP, will bring in paperwork for 2023  re-enrollment  Patient Goals/Self-Care Activities Patient will:  - Exercise 150 minutes per week, collaborate with pharmacist on patient assistance    Current Barriers:  Unable to independently afford treatment regimen  Pharmacist Clinical Goal(s):  Patient will verbalize ability to afford treatment regimen through collaboration with PharmD and provider.   Interventions: 1:1 collaboration with Valerie Roys, DO regarding development and update of comprehensive plan of care as evidenced by provider attestation and co-signature Inter-disciplinary care team collaboration (see longitudinal plan of care) Comprehensive medication review performed; medication list updated in electronic medical record   Medication Assistance: Application for Trulicity [employer] re-enrollment  medication assistance program. in  process.  Anticipated assistance start date 09/2021.  See plan of care for additional detail.  Patient's preferred pharmacy is:  [facility] [place], [place] [address] Phone: [phone] Fax: [phone]  [facility] [place], [place] [facility] AT [facility] [address] [place] [address] Phone: [phone] Fax: [phone]  [facility] - [place], [place] - [address] [place] [address] Phone: [phone] Fax: [phone]   Pt endorses 100% compliance  Follow Up:  Patient agrees to Care Plan and Follow-up. Plan: HC - monthly dispensing calls. Pharmacist 6 month f/u   Future Appointments  Date Time Provider [place]  08/27/2021  2:00 PM CFP CCM PHARMACY [facility]  09/03/2021  1:40 PM [facility]  10/10/2021  8:00 AM Valerie Roys, DO [facility]  05/06/2022  8:15 AM CFP NURSE HEALTH ADVISOR [facility]FP[facility] [place] [facility], PharmD, BCGP Clinical  Pharmacist  [phone]

## 2021-08-28 ENCOUNTER — Telehealth: Payer: Self-pay | Admitting: Family Medicine

## 2021-08-28 NOTE — Telephone Encounter (Signed)
Pt dropped off patient assistance forms to be completed by the provider and Edison Nasuti.  When completed, forms need to be faxed to [phone].  Please call pt at [phone] when forms are completed.  Placed in provider's folder.

## 2021-09-03 ENCOUNTER — Ambulatory Visit
Admission: RE | Admit: 2021-09-03 | Discharge: 2021-09-03 | Disposition: A | Discharge status: Home / Self Care | Payer: PPO | Source: Ambulatory Visit | Attending: Family Medicine | Admitting: Family Medicine

## 2021-09-03 ENCOUNTER — Other Ambulatory Visit: Payer: Self-pay

## 2021-09-03 ENCOUNTER — Encounter: Payer: Self-pay | Admitting: Family Medicine

## 2021-09-03 DIAGNOSIS — Z1231 Encounter for screening mammogram for malignant neoplasm of breast: Secondary | ICD-10-CM | POA: Insufficient documentation

## 2021-09-04 DIAGNOSIS — H16223 Keratoconjunctivitis sicca, not specified as Sjogren's, bilateral: Secondary | ICD-10-CM | POA: Diagnosis not present

## 2021-09-20 ENCOUNTER — Other Ambulatory Visit: Payer: Self-pay | Admitting: Family Medicine

## 2021-09-20 MED ORDER — LOVASTATIN 40 MG PO TABS: 40.0000 mg | ORAL_TABLET | Freq: Every day | ORAL | 1 refills | Status: DC

## 2021-09-20 NOTE — Telephone Encounter (Signed)
Requested Prescriptions  Pending Prescriptions Disp Refills  . lovastatin (MEVACOR) 40 MG tablet 90 tablet 1    Sig: Take 1 tablet (40 mg total) by mouth at bedtime.     Cardiovascular:  Antilipid - Statins Passed - 09/20/2021  2:41 PM      Passed - Total Cholesterol in normal range and within 360 days    Cholesterol, Total  Date Value Ref Range Status  04/03/2021 132 100 - 199 mg/dL Final   Cholesterol Piccolo, Waived  Date Value Ref Range Status  10/29/2018 144 <200 mg/dL Final    Comment:                            Desirable                <200                         Borderline High      200- 239                         High                     >239          Passed - LDL in normal range and within 360 days    LDL Chol Calc (NIH)  Date Value Ref Range Status  04/03/2021 59 0 - 99 mg/dL Final         Passed - HDL in normal range and within 360 days    HDL  Date Value Ref Range Status  04/03/2021 50 >39 mg/dL Final         Passed - Triglycerides in normal range and within 360 days    Triglycerides  Date Value Ref Range Status  04/03/2021 131 0 - 149 mg/dL Final   Triglycerides Piccolo,Waived  Date Value Ref Range Status  10/29/2018 146 <150 mg/dL Final    Comment:                            Normal                   <150                         Borderline High     150 - 199                         High                200 - 499                         Very High                >499          Passed - Patient is not pregnant      Passed - Valid encounter within last 12 months    Recent Outpatient Visits          4 months ago COVID-19   [employer], Lauren A, NP   5 months ago Benign hypertensive renal disease   [facility] Johnson, Megan P, DO   11 months ago Routine  general medical examination at a health care facility   [place], [place], DO   1 year ago Other conjunctivitis of right eye   [facility] Kathrine Haddock, NP   1 year ago Flu vaccine need   [facility] Kathrine Haddock, NP      Future Appointments            In 2 weeks Wynetta Emery, Barb Merino, DO [place], [place]   In 7 months  [employer], [place]

## 2021-09-20 NOTE — Telephone Encounter (Signed)
Copied from [place] [phone]. Topic: Quick Communication - Rx Refill/Question >> Sep 20, 2021 10:46 AM Tessa Lerner A wrote: Medication: lovastatin (MEVACOR) 40 MG tablet [[id number]]  Has the patient contacted their pharmacy? No.This will be the first time the patient uses this pharmacy for this medication  (Agent: If no, request that the patient contact the pharmacy for the refill. If patient does not wish to contact the pharmacy document the reason why and proceed with request.) (Agent: If yes, when and what did the pharmacy advise?)  Preferred Pharmacy (with phone number or street name): [facility] [place] [address]  Phone:  [phone] Fax:  [phone]  Has the patient been seen for an appointment in the last year OR does the patient have an upcoming appointment? Yes.    Agent: Please be advised that RX refills may take up to 3 business days. We ask that you follow-up with your pharmacy.

## 2021-09-24 ENCOUNTER — Telehealth: Payer: Self-pay

## 2021-09-24 NOTE — Telephone Encounter (Signed)
  Chronic Care Management  Outreach Note   Name: Julia Nielsen MRN: [id number] DOB: 10-21-1940  Referred by: Valerie Roys, DO Reason for referral: Medication assistance  Spoke with [facility] regarding additional information needed on patient application. Insurance information provided, otherwise no additional information needed. Ralph Leyden will finalize application processing and update practice and patient once approved.    Madelin Rear, PharmD Clinical Pharmacist  [place] [phone]

## 2021-10-10 ENCOUNTER — Ambulatory Visit (INDEPENDENT_AMBULATORY_CARE_PROVIDER_SITE_OTHER): Payer: PPO | Admitting: Family Medicine

## 2021-10-10 ENCOUNTER — Other Ambulatory Visit: Payer: Self-pay

## 2021-10-10 ENCOUNTER — Encounter: Payer: Self-pay | Admitting: Family Medicine

## 2021-10-10 VITALS — BP 121/67 | HR 89 | Temp 98.0°F | Ht 61.0 in | Wt 172.2 lb

## 2021-10-10 DIAGNOSIS — Z0000 Encounter for general adult medical examination without abnormal findings: Secondary | ICD-10-CM | POA: Diagnosis not present

## 2021-10-10 DIAGNOSIS — J309 Allergic rhinitis, unspecified: Secondary | ICD-10-CM

## 2021-10-10 DIAGNOSIS — N2581 Secondary hyperparathyroidism of renal origin: Secondary | ICD-10-CM

## 2021-10-10 DIAGNOSIS — I129 Hypertensive chronic kidney disease with stage 1 through stage 4 chronic kidney disease, or unspecified chronic kidney disease: Secondary | ICD-10-CM

## 2021-10-10 DIAGNOSIS — E1122 Type 2 diabetes mellitus with diabetic chronic kidney disease: Secondary | ICD-10-CM

## 2021-10-10 DIAGNOSIS — I6523 Occlusion and stenosis of bilateral carotid arteries: Secondary | ICD-10-CM | POA: Diagnosis not present

## 2021-10-10 DIAGNOSIS — E1169 Type 2 diabetes mellitus with other specified complication: Secondary | ICD-10-CM

## 2021-10-10 DIAGNOSIS — E785 Hyperlipidemia, unspecified: Secondary | ICD-10-CM | POA: Diagnosis not present

## 2021-10-10 DIAGNOSIS — N183 Chronic kidney disease, stage 3 unspecified: Secondary | ICD-10-CM | POA: Diagnosis not present

## 2021-10-10 DIAGNOSIS — I700 Atherosclerosis of aorta: Secondary | ICD-10-CM

## 2021-10-10 DIAGNOSIS — K7460 Unspecified cirrhosis of liver: Secondary | ICD-10-CM | POA: Diagnosis not present

## 2021-10-10 DIAGNOSIS — E039 Hypothyroidism, unspecified: Secondary | ICD-10-CM

## 2021-10-10 DIAGNOSIS — D693 Immune thrombocytopenic purpura: Secondary | ICD-10-CM

## 2021-10-10 DIAGNOSIS — I2510 Atherosclerotic heart disease of native coronary artery without angina pectoris: Secondary | ICD-10-CM | POA: Diagnosis not present

## 2021-10-10 LAB — URINALYSIS, ROUTINE W REFLEX MICROSCOPIC
Bilirubin, UA: NEGATIVE
Glucose, UA: NEGATIVE
Ketones, UA: NEGATIVE
Nitrite, UA: NEGATIVE
Protein,UA: NEGATIVE
RBC, UA: NEGATIVE
Specific Gravity, UA: 1.015 (ref 1.005–1.030)
Urobilinogen, Ur: 0.2 mg/dL (ref 0.2–1.0)
pH, UA: 5.5 (ref 5.0–7.5)

## 2021-10-10 LAB — MICROSCOPIC EXAMINATION
Epithelial Cells (non renal): NONE SEEN /hpf (ref 0–10)
RBC, Urine: NONE SEEN /hpf (ref 0–2)

## 2021-10-10 LAB — MICROALBUMIN, URINE WAIVED
Creatinine, Urine Waived: 50 mg/dL (ref 10–300)
Microalb, Ur Waived: 30 mg/L — ABNORMAL HIGH (ref 0–19)

## 2021-10-10 LAB — BAYER DCA HB A1C WAIVED: HB A1C (BAYER DCA - WAIVED): 6 % — ABNORMAL HIGH (ref 4.8–5.6)

## 2021-10-10 MED ORDER — CARVEDILOL 25 MG PO TABS: 25.0000 mg | ORAL_TABLET | Freq: Two times a day (BID) | ORAL | 1 refills | Status: DC

## 2021-10-10 MED ORDER — FLUTICASONE PROPIONATE 50 MCG/ACT NA SUSP: 2.0000 | Freq: Every day | NASAL | 12 refills | Status: DC

## 2021-10-10 MED ORDER — BENAZEPRIL HCL 40 MG PO TABS: 40.0000 mg | ORAL_TABLET | Freq: Every day | ORAL | 1 refills | Status: DC

## 2021-10-10 MED ORDER — LOVASTATIN 40 MG PO TABS: 40.0000 mg | ORAL_TABLET | Freq: Every day | ORAL | 1 refills | Status: DC

## 2021-10-10 MED ORDER — TRULICITY 1.5 MG/0.5ML ~~LOC~~ SOAJ: 1.5000 mg | SUBCUTANEOUS | 1 refills | Status: DC

## 2021-10-10 MED ORDER — ALBUTEROL SULFATE HFA 108 (90 BASE) MCG/ACT IN AERS
2.0000 | INHALATION_SPRAY | Freq: Four times a day (QID) | RESPIRATORY_TRACT | 6 refills | Status: DC | PRN
Start: 2021-10-10 — End: 2022-04-10

## 2021-10-10 MED ORDER — AMLODIPINE BESYLATE 10 MG PO TABS: 10.0000 mg | ORAL_TABLET | Freq: Every morning | ORAL | 1 refills | Status: DC

## 2021-10-10 MED ORDER — CLONIDINE HCL 0.1 MG PO TABS: ORAL_TABLET | ORAL | 1 refills | Status: DC

## 2021-10-10 NOTE — Assessment & Plan Note (Signed)
Doing well with a1c of 6.0. Continue current regimen. Continue to monitor. Call with any concerns.

## 2021-10-10 NOTE — Assessment & Plan Note (Signed)
Under good control on current regimen. Continue current regimen. Continue to monitor. Call with any concerns. Refills given. Labs drawn today.   

## 2021-10-10 NOTE — Assessment & Plan Note (Signed)
Stable. Continue to monitor. Refills given today. Call with any concerns.

## 2021-10-10 NOTE — Assessment & Plan Note (Signed)
Under good control on current regimen. Continue current regimen. Continue to monitor. Call with any concerns. Refills given.   

## 2021-10-10 NOTE — Assessment & Plan Note (Signed)
Rechecking labs today. Await results. Treat as needed.  °

## 2021-10-10 NOTE — Assessment & Plan Note (Signed)
Stable. Continue to follow with cardiology. Call with any concerns.  

## 2021-10-10 NOTE — Progress Notes (Signed)
BP 121/67    Pulse 89    Temp 98 F (36.7 C)    Ht 5\' 1"  (1.549 m)    Wt 172 lb 3.2 oz (78.1 kg)    SpO2 96%    BMI 32.54 kg/m    Subjective:    Patient ID: Julia Nielsen, female    DOB: 05-31-1941, 80 y.o.   MRN: [id number]  HPI: Julia Nielsen is a 80 y.o. female presenting on 10/10/2021 for comprehensive medical examination. Current medical complaints include:  Lost her daughter in October. Has been doing OK, but sad.   DIABETES Hypoglycemic episodes:no Polydipsia/polyuria: no Visual disturbance: no Chest pain: no Paresthesias: no Glucose Monitoring: no  Accucheck frequency: Not Checking Taking Insulin?: no Blood Pressure Monitoring: not checking Retinal Examination: Up to Date Foot Exam: Up to Date Diabetic Education: Completed Pneumovax: Up to Date Influenza: Up to Date Aspirin: yes  HYPERTENSION / HYPERLIPIDEMIA Satisfied with current treatment? yes Duration of hypertension: chronic BP monitoring frequency: not checking BP medication side effects: no Past BP meds: amlodipine, benazepril, carvedilol, clonidine, lasix Duration of hyperlipidemia: chronic Cholesterol medication side effects: no Cholesterol supplements: none Past cholesterol medications: lovastatin Medication compliance: excellent compliance Aspirin: no Recent stressors: yes Recurrent headaches: no Visual changes: no Palpitations: no Dyspnea: no Chest pain: no Lower extremity edema: no Dizzy/lightheaded: no  HYPOTHYROIDISM Thyroid control status:controlled Satisfied with current treatment? yes Medication side effects: no Medication compliance: excellent compliance Recent dose adjustment:no Fatigue: no Cold intolerance: no Heat intolerance: no Weight gain: no Weight loss: no Constipation: no Diarrhea/loose stools: no Palpitations: no Lower extremity edema: no Anxiety/depressed mood: no  Menopausal Symptoms: no  Depression Screen done today and results listed below:  Depression  screen [facility] 2/9 10/10/2021 05/04/2021 10/04/2020 07/27/2020 05/01/2020  Decreased Interest 0 0 0 0 0  Down, Depressed, Hopeless 0 0 0 0 0  PHQ - 2 Score 0 0 0 0 0  Altered sleeping 0 - - - 0  Tired, decreased energy 0 - - - 0  Change in appetite 0 - - - 0  Feeling bad or failure about yourself  0 - - - 0  Trouble concentrating 0 - - - 0  Moving slowly or fidgety/restless 0 - - - 0  Suicidal thoughts - - - - 0  PHQ-9 Score 0 - - - 0  Difficult doing work/chores - - - - Not difficult at all    Past Medical History:  Past Medical History:  Diagnosis Date   Arthritis    OSTEO OF KNEE   Breast cancer ([place]) 2000   left breast cancer, radiation tx's.   CAD (coronary artery disease)    Carotid artery stenosis    bilateral   Cataract    Chronic kidney disease    Chronic stage 3   Coronary heart disease    Diabetes mellitus without complication (HCC)    Edema of both legs    GERD (gastroesophageal reflux disease)    Glaucoma    Hyperlipemia    mixed   Hyperlipidemia    Hypertension    Hypothyroidism    Mitral insufficiency    moderate   Osteopenia    Osteopenia    Personal history of radiation therapy 2000   LEFT lumpectomy   Stenosis of carotid artery    Thrombocytopenia (HCC)    Tricuspid insufficiency    moderate    Surgical History:  Past Surgical History:  Procedure Laterality Date   APPENDECTOMY  BLADDER SURGERY     BREAST EXCISIONAL BIOPSY Left 2000   breast ca   BREAST LUMPECTOMY Left    BREAST SURGERY     CARDIAC CATHETERIZATION     CARPAL TUNNEL RELEASE Bilateral    CHOLECYSTECTOMY     COLONOSCOPY     COLONOSCOPY WITH PROPOFOL N/A 08/12/2018   Procedure: COLONOSCOPY WITH PROPOFOL;  Surgeon: Manya Silvas, MD;  [facility] END[facility]COPY;  Service: Endoscopy;  Laterality: N/A;   JOINT REPLACEMENT Right 2015   knee   parathyroid surgery     SHOULDER SURGERY Right    TUBAL LIGATION      Medications:  Current Outpatient Medications on File Prior  to Visit  Medication Sig   Biotin w/ Vitamins C & E (HAIR/SKIN/NAILS PO) Take 1 tablet by mouth daily.   brimonidine (ALPHAGAN) 0.2 % ophthalmic solution 1 drop 2 (two) times daily.   dorzolamide-timolol (COSOPT) 22.3-6.8 MG/ML ophthalmic solution Place 1 drop into both eyes 2 (two) times daily.    fexofenadine (ALLEGRA) 180 MG tablet Take 180 mg by mouth daily.   furosemide (LASIX) 20 MG tablet Take 20 mg by mouth daily.   glucose blood (ONETOUCH ULTRA) test strip 1 each by Other route as needed for other. Use as instructed   latanoprost (XALATAN) 0.005 % ophthalmic solution Place 1 drop into both eyes at bedtime.    levothyroxine (SYNTHROID) 150 MCG tablet TAKE ONE TABLET BY MOUTH BEFORE BREAKFAST   ONETOUCH DELICA LANCETS 54M MISC USE AS DIRECTED ONCE DAILY WITH TEST STRIPS E11.22   No current facility-administered medications on file prior to visit.    Allergies:  Allergies  Allergen Reactions   Prednisone     Elevated blood sugar     Social History:  Social History   Socioeconomic History   Marital status: Married    Spouse name: Not on file   Number of children: Not on file   Years of education: Not on file   Highest education level: High school graduate  Occupational History   Occupation: retired  Tobacco Use   Smoking status: Never   Smokeless tobacco: Never  Vaping Use   Vaping Use: Never used  Substance and Sexual Activity   Alcohol use: No   Drug use: No   Sexual activity: Not Currently  Other Topics Concern   Not on file  Social History Narrative   Not on file   Social Determinants of Health   Financial Resource Strain: Low Risk    Difficulty of Paying Living Expenses: Not hard at all  Food Insecurity: No Food Insecurity   Worried About Charity fundraiser in the Last Year: Never true   [place] in the Last Year: Never true  Transportation Needs: No Transportation Needs   Lack of Transportation (Medical): No   Lack of Transportation  (Non-Medical): No  Physical Activity: Sufficiently Active   Days of Exercise per Week: 7 days   Minutes of Exercise per Session: 60 min  Stress: No Stress Concern Present   Feeling of Stress : Not at all  Social Connections: Not on file  Intimate Partner Violence: Not on file   Social History   Tobacco Use  Smoking Status Never  Smokeless Tobacco Never   Social History   Substance and Sexual Activity  Alcohol Use No    Family History:  Family History  Problem Relation Age of Onset   Gallbladder disease Mother    Aneurysm Mother    COPD  Father    Congestive Heart Failure Father    Emphysema Father    Leukemia Sister    Stroke Brother    Dementia Brother    Alzheimer's disease Brother    Congestive Heart Failure Sister    Emphysema Sister    Breast cancer Neg Hx     Past medical history, surgical history, medications, allergies, family history and social history reviewed with patient today and changes made to appropriate areas of the chart.   Review of Systems  Constitutional: Negative.   HENT: Negative.    Eyes: Negative.   Respiratory: Negative.    Cardiovascular: Negative.   Gastrointestinal:  Positive for diarrhea. Negative for abdominal pain, blood in stool, constipation, heartburn, melena, nausea and vomiting.  Genitourinary: Negative.   Musculoskeletal: Negative.   Skin: Negative.   Neurological:  Positive for tingling. Negative for dizziness, tremors, sensory change, speech change, focal weakness, seizures, loss of consciousness, weakness and headaches.  Endo/Heme/Allergies:  Negative for environmental allergies and polydipsia. Bruises/bleeds easily.  Psychiatric/Behavioral: Negative.    All other ROS negative except what is listed above and in the HPI.      Objective:    BP 121/67    Pulse 89    Temp 98 F (36.7 C)    Ht 5\' 1"  (1.549 m)    Wt 172 lb 3.2 oz (78.1 kg)    SpO2 96%    BMI 32.54 kg/m   Wt Readings from Last 3 Encounters:  10/10/21 172  lb 3.2 oz (78.1 kg)  05/18/21 170 lb 13.7 oz (77.5 kg)  05/04/21 171 lb (77.6 kg)    Physical Exam Vitals and nursing note reviewed.  Constitutional:      General: She is not in acute distress.    Appearance: Normal appearance. She is obese. She is not ill-appearing, toxic-appearing or diaphoretic.  HENT:     Head: Normocephalic and atraumatic.     Right Ear: Tympanic membrane, ear canal and external ear normal. There is no impacted cerumen.     Left Ear: Tympanic membrane, ear canal and external ear normal. There is no impacted cerumen.     Nose: Nose normal. No congestion or rhinorrhea.     Mouth/Throat:     Mouth: Mucous membranes are moist.     Pharynx: Oropharynx is clear. No oropharyngeal exudate or posterior oropharyngeal erythema.  Eyes:     General: No scleral icterus.       Right eye: No discharge.        Left eye: No discharge.     Extraocular Movements: Extraocular movements intact.     Conjunctiva/sclera: Conjunctivae normal.     Pupils: Pupils are equal, round, and reactive to light.  Neck:     Vascular: No carotid bruit.  Cardiovascular:     Rate and Rhythm: Normal rate and regular rhythm.     Pulses: Normal pulses.     Heart sounds: No murmur heard.   No friction rub. No gallop.  Pulmonary:     Effort: Pulmonary effort is normal. No respiratory distress.     Breath sounds: Normal breath sounds. No stridor. No wheezing, rhonchi or rales.  Chest:     Chest wall: No tenderness.  Abdominal:     General: Abdomen is flat. Bowel sounds are normal. There is no distension.     Palpations: Abdomen is soft. There is no mass.     Tenderness: There is no abdominal tenderness. There is no right CVA tenderness, left CVA  tenderness, guarding or rebound.     Hernia: No hernia is present.  Genitourinary:    Comments: Breast and pelvic exams deferred with shared decision making Musculoskeletal:        General: No swelling, tenderness, deformity or signs of injury.      Cervical back: Normal range of motion and neck supple. No rigidity. No muscular tenderness.     Right lower leg: No edema.     Left lower leg: No edema.  Lymphadenopathy:     Cervical: No cervical adenopathy.  Skin:    General: Skin is warm and dry.     Capillary Refill: Capillary refill takes less than 2 seconds.     Coloration: Skin is not jaundiced or pale.     Findings: No bruising, erythema, lesion or rash.  Neurological:     General: No focal deficit present.     Mental Status: She is alert and oriented to person, place, and time. Mental status is at baseline.     Cranial Nerves: No cranial nerve deficit.     Sensory: No sensory deficit.     Motor: No weakness.     Coordination: Coordination normal.     Gait: Gait normal.     Deep Tendon Reflexes: Reflexes normal.  Psychiatric:        Mood and Affect: Mood normal.        Behavior: Behavior normal.        Thought Content: Thought content normal.        Judgment: Judgment normal.    Results for orders placed or performed in visit on 07/11/21  HM DIABETES EYE EXAM  Result Value Ref Range   HM Diabetic Eye Exam No Retinopathy No Retinopathy      Assessment & Plan:   Problem List Items Addressed This Visit       Cardiovascular and Mediastinum   CAD (coronary artery disease)    Stable. Continue to follow with cardiology. Call with any concerns.       Relevant Medications   amLODipine (NORVASC) 10 MG tablet   benazepril (LOTENSIN) 40 MG tablet   carvedilol (COREG) 25 MG tablet   cloNIDine (CATAPRES) 0.1 MG tablet   lovastatin (MEVACOR) 40 MG tablet   Other Relevant Orders   CBC with Differential/Platelet   Comprehensive metabolic panel   Bilateral carotid artery stenosis    Stable. Continue to follow with cardiology. Call with any concerns.       Relevant Medications   amLODipine (NORVASC) 10 MG tablet   benazepril (LOTENSIN) 40 MG tablet   carvedilol (COREG) 25 MG tablet   cloNIDine (CATAPRES) 0.1 MG tablet    lovastatin (MEVACOR) 40 MG tablet   Atherosclerosis of aorta (HCC)    Stable. Continue to follow with cardiology. Call with any concerns.       Relevant Medications   amLODipine (NORVASC) 10 MG tablet   benazepril (LOTENSIN) 40 MG tablet   carvedilol (COREG) 25 MG tablet   cloNIDine (CATAPRES) 0.1 MG tablet   lovastatin (MEVACOR) 40 MG tablet   Other Relevant Orders   CBC with Differential/Platelet   Comprehensive metabolic panel     Respiratory   Multiple respiratory allergies    Under good control on current regimen. Continue current regimen. Continue to monitor. Call with any concerns. Refills given.        Relevant Medications   fluticasone (FLONASE) 50 MCG/ACT nasal spray     Digestive   Cirrhosis of liver without ascites ([place])  Stable. Continue to monitor. Refills given today. Call with any concerns.       Relevant Orders   CBC with Differential/Platelet   Comprehensive metabolic panel     Endocrine   Hyperlipidemia associated with type 2 diabetes mellitus ([place])    Under good control on current regimen. Continue current regimen. Continue to monitor. Call with any concerns. Refills given. Labs drawn today.       Relevant Medications   amLODipine (NORVASC) 10 MG tablet   benazepril (LOTENSIN) 40 MG tablet   carvedilol (COREG) 25 MG tablet   cloNIDine (CATAPRES) 0.1 MG tablet   lovastatin (MEVACOR) 40 MG tablet   Dulaglutide (TRULICITY) 1.5 BJ/4.7WG SOPN   Other Relevant Orders   CBC with Differential/Platelet   Comprehensive metabolic panel   Lipid Panel w/o Chol/HDL Ratio   Type 2 diabetes mellitus with stage 3 chronic kidney disease (HCC)    Doing well with a1c of 6.0. Continue current regimen. Continue to monitor. Call with any concerns.       Relevant Medications   benazepril (LOTENSIN) 40 MG tablet   lovastatin (MEVACOR) 40 MG tablet   Dulaglutide (TRULICITY) 1.5 NF/6.2ZH SOPN   Other Relevant Orders   CBC with Differential/Platelet    Comprehensive metabolic panel   Bayer DCA Hb A1c Waived   Microalbumin, Urine Waived   Secondary hyperparathyroidism of renal origin ([place])    Rechecking labs today. Await results. Treat as needed.       Relevant Orders   CBC with Differential/Platelet   Comprehensive metabolic panel   PTH, Intact and Calcium   Hypothyroidism    Rechecking labs today. Await results. Treat as needed.       Relevant Medications   carvedilol (COREG) 25 MG tablet   Other Relevant Orders   CBC with Differential/Platelet   Comprehensive metabolic panel   TSH     Genitourinary   Benign hypertensive renal disease    Under good control on current regimen. Continue current regimen. Continue to monitor. Call with any concerns. Refills given. Labs drawn today.       Relevant Orders   CBC with Differential/Platelet   Comprehensive metabolic panel   Urinalysis, Routine w reflex microscopic   Microalbumin, Urine Waived   CKD (chronic kidney disease) stage 3, GFR 30-59 ml/min (HCC)    Rechecking labs today. Await results. Treat as needed.       Relevant Orders   CBC with Differential/Platelet   Comprehensive metabolic panel     Hematopoietic and Hemostatic   Idiopathic thrombocytopenia ([place])    Rechecking labs today. Await results. Treat as needed.       Relevant Orders   CBC with Differential/Platelet   Comprehensive metabolic panel   Other Visit Diagnoses     Routine general medical examination at a health care facility    -  Primary   Vaccines up to date. Screening labs checked today. Mammo and DEXA ordered. Continue diet and exercise. Call with any concerns.         Follow up plan: Return in about 6 months (around 04/10/2022).   LABORATORY TESTING:  - Pap smear: not applicable  IMMUNIZATIONS:   - Tdap: Tetanus vaccination status reviewed: last tetanus booster within 10 years. - Influenza: Up to date - Pneumovax: Up to date - Prevnar: Up to date - COVID: Up to date - HPV: Not  applicable - Shingrix vaccine: Not applicable  SCREENING: -Mammogram: Up to date  - Colonoscopy: Up to date  -  Bone Density: Ordered today   PATIENT COUNSELING:   Advised to take 1 mg of folate supplement per day if capable of pregnancy.   Sexuality: Discussed sexually transmitted diseases, partner selection, use of condoms, avoidance of unintended pregnancy  and contraceptive alternatives.   Advised to avoid cigarette smoking.  I discussed with the patient that most people either abstain from alcohol or drink within safe limits (<=14/week and <=4 drinks/occasion for males, <=7/weeks and <= 3 drinks/occasion for females) and that the risk for alcohol disorders and other health effects rises proportionally with the number of drinks per week and how often a drinker exceeds daily limits.  Discussed cessation/primary prevention of drug use and availability of treatment for abuse.   Diet: Encouraged to adjust caloric intake to maintain  or achieve ideal body weight, to reduce intake of dietary saturated fat and total fat, to limit sodium intake by avoiding high sodium foods and not adding table salt, and to maintain adequate dietary potassium and calcium preferably from fresh fruits, vegetables, and low-fat dairy products.    stressed the importance of regular exercise  Injury prevention: Discussed safety belts, safety helmets, smoke detector, smoking near bedding or upholstery.   Dental health: Discussed importance of regular tooth brushing, flossing, and dental visits.    NEXT PREVENTATIVE PHYSICAL DUE IN 1 YEAR. Return in about 6 months (around 04/10/2022).

## 2021-10-10 NOTE — Patient Instructions (Signed)
Please call to schedule your bone density: [facility]  Address: [address] [place], [place], [place] [address]  Phone: [phone]

## 2021-10-11 ENCOUNTER — Encounter: Payer: Self-pay | Admitting: Family Medicine

## 2021-10-11 LAB — COMPREHENSIVE METABOLIC PANEL
ALT: 22 IU/L (ref 0–32)
AST: 26 IU/L (ref 0–40)
Albumin/Globulin Ratio: 1.8 (ref 1.2–2.2)
Albumin: 4 g/dL (ref 3.6–4.6)
Alkaline Phosphatase: 59 IU/L (ref 44–121)
BUN/Creatinine Ratio: 16 (ref 12–28)
BUN: 20 mg/dL (ref 8–27)
Bilirubin Total: 0.6 mg/dL (ref 0.0–1.2)
CO2: 20 mmol/L (ref 20–29)
Calcium: 9 mg/dL (ref 8.7–10.3)
Chloride: 105 mmol/L (ref 96–106)
Creatinine, Ser: 1.27 mg/dL — ABNORMAL HIGH (ref 0.57–1.00)
Globulin, Total: 2.2 g/dL (ref 1.5–4.5)
Glucose: 186 mg/dL — ABNORMAL HIGH (ref 70–99)
Potassium: 4 mmol/L (ref 3.5–5.2)
Sodium: 140 mmol/L (ref 134–144)
Total Protein: 6.2 g/dL (ref 6.0–8.5)
eGFR: 42 mL/min/{1.73_m2} — ABNORMAL LOW (ref >59)

## 2021-10-11 LAB — LIPID PANEL W/O CHOL/HDL RATIO
Cholesterol, Total: 131 mg/dL (ref 100–199)
HDL: 51 mg/dL (ref >39)
LDL Chol Calc (NIH): 58 mg/dL (ref 0–99)
Triglycerides: 121 mg/dL (ref 0–149)
VLDL Cholesterol Cal: 22 mg/dL (ref 5–40)

## 2021-10-11 LAB — CBC WITH DIFFERENTIAL/PLATELET
Basophils Absolute: 0 10*3/uL (ref 0.0–0.2)
Basos: 1 %
EOS (ABSOLUTE): 0.1 10*3/uL (ref 0.0–0.4)
Eos: 3 %
Hematocrit: 39.4 % (ref 34.0–46.6)
Hemoglobin: 12.4 g/dL (ref 11.1–15.9)
Immature Grans (Abs): 0 10*3/uL (ref 0.0–0.1)
Immature Granulocytes: 0 %
Lymphocytes Absolute: 1.2 10*3/uL (ref 0.7–3.1)
Lymphs: 30 %
MCH: 28.9 pg (ref 26.6–33.0)
MCHC: 31.5 g/dL (ref 31.5–35.7)
MCV: 92 fL (ref 79–97)
Monocytes Absolute: 0.3 10*3/uL (ref 0.1–0.9)
Monocytes: 8 %
Neutrophils Absolute: 2.3 10*3/uL (ref 1.4–7.0)
Neutrophils: 58 %
Platelets: 78 10*3/uL — CL (ref 150–450)
RBC: 4.29 x10E6/uL (ref 3.77–5.28)
RDW: 13.4 % (ref 11.7–15.4)
WBC: 4 10*3/uL (ref 3.4–10.8)

## 2021-10-11 LAB — TSH: TSH: 3.54 u[IU]/mL (ref 0.450–4.500)

## 2021-10-11 LAB — PTH, INTACT AND CALCIUM: PTH: 28 pg/mL (ref 15–65)

## 2021-10-19 ENCOUNTER — Other Ambulatory Visit: Payer: Self-pay | Admitting: Family Medicine

## 2021-10-19 DIAGNOSIS — E1122 Type 2 diabetes mellitus with diabetic chronic kidney disease: Secondary | ICD-10-CM

## 2021-10-19 MED ORDER — ONETOUCH ULTRA VI STRP: 1.0000 | ORAL_STRIP | 6 refills | Status: DC | PRN

## 2021-10-19 NOTE — Telephone Encounter (Signed)
Requested Prescriptions  Pending Prescriptions Disp Refills   glucose blood (ONETOUCH ULTRA) test strip 100 each 11    Sig: 1 each by Other route as needed for other. Use as instructed     Endocrinology: Diabetes - Testing Supplies Passed - 10/19/2021 10:15 AM      Passed - Valid encounter within last 12 months    Recent Outpatient Visits          1 week ago Routine general medical examination at a health care facility   [facility], Harmon P, DO   5 months ago COVID-19   [facility] Lauren A, NP   6 months ago Benign hypertensive renal disease   [place], [place], DO   1 year ago Routine general medical examination at a health care facility   [facility], [place], DO   1 year ago Other conjunctivitis of right eye   [facility] Kathrine Haddock, NP      Future Appointments            In 5 months Wynetta Emery, Barb Merino, DO [place], [place]   In 6 months  [employer], [place]

## 2021-10-19 NOTE — Telephone Encounter (Signed)
Copied from [place] [id number]. Topic: Quick Communication - Rx Refill/Question >> Oct 19, 2021  8:14 AM Leward Quan A wrote: Medication: glucose blood (ONETOUCH ULTRA) test strip Changing pharmacy Has the patient contacted their pharmacy? No. Changing pharmacies  (Agent: If no, request that the patient contact the pharmacy for the refill. If patient does not wish to contact the pharmacy document the reason why and proceed with request.) (Agent: If yes, when and what did the pharmacy advise?)  Preferred Pharmacy (with phone number or street name): [facility] [place] [address]  Phone:  [phone] Fax:  [phone]    Has the patient been seen for an appointment in the last year OR does the patient have an upcoming appointment? Yes.    Agent: Please be advised that RX refills may take up to 3 business days. We ask that you follow-up with your pharmacy.

## 2021-11-14 ENCOUNTER — Other Ambulatory Visit: Payer: Self-pay | Admitting: Family Medicine

## 2021-11-14 DIAGNOSIS — I2510 Atherosclerotic heart disease of native coronary artery without angina pectoris: Secondary | ICD-10-CM

## 2021-11-14 DIAGNOSIS — E039 Hypothyroidism, unspecified: Secondary | ICD-10-CM

## 2021-11-15 NOTE — Telephone Encounter (Signed)
Newer rx at different pharm of all refused meds. Requested Prescriptions  Pending Prescriptions Disp Refills   benazepril (LOTENSIN) 40 MG tablet [Pharmacy Med Name: benazepril 40 mg tablet] 90 tablet 1    Sig: TAKE ONE TABLET BY MOUTH ONCE DAILY     Cardiovascular:  ACE Inhibitors Failed - 11/14/2021 12:57 PM      Failed - Cr in normal range and within 180 days    Creatinine  Date Value Ref Range Status  06/02/2014 1.32 (H) 0.60 - 1.30 mg/dL Final   Creatinine, Ser  Date Value Ref Range Status  10/10/2021 1.27 (H) 0.57 - 1.00 mg/dL Final         Passed - K in normal range and within 180 days    Potassium  Date Value Ref Range Status  10/10/2021 4.0 3.5 - 5.2 mmol/L Final  06/02/2014 3.7 3.5 - 5.1 mmol/L Final         Passed - Patient is not pregnant      Passed - Last BP in normal range    BP Readings from Last 1 Encounters:  10/10/21 121/67         Passed - Valid encounter within last 6 months    Recent Outpatient Visits          1 month ago Routine general medical examination at a health care facility   [facility], [place], DO   6 months ago [place], Lauren A, NP   7 months ago Benign hypertensive renal disease   [place], [place], DO   1 year ago Routine general medical examination at a health care facility   [facility], [place], DO   1 year ago Other conjunctivitis of right eye   [facility] Kathrine Haddock, NP      Future Appointments            In 4 months Johnson, Megan P, DO [place], [place]   In 5 months  [employer] [employer], [facility]            cloNIDine (CATAPRES) 0.1 MG tablet [Pharmacy Med Name: clonidine HCl 0.1 mg tablet] 180 tablet 1    Sig: TAKE ONE TABLET BY MOUTH TWICE DAILY     Cardiovascular:  Alpha-2 Agonists Passed - 11/14/2021 12:57 PM      Passed - Last BP in normal range    BP Readings from Last 1  Encounters:  10/10/21 121/67         Passed - Last Heart Rate in normal range    Pulse Readings from Last 1 Encounters:  10/10/21 89         Passed - Valid encounter within last 6 months    Recent Outpatient Visits          1 month ago Routine general medical examination at a health care facility   [facility], [place], DO   6 months ago [place], Lauren A, NP   7 months ago Benign hypertensive renal disease   [place], Megan P, DO   1 year ago Routine general medical examination at a health care facility   [place], [place], DO   1 year ago Other conjunctivitis of right eye   [facility] Kathrine Haddock, NP      Future Appointments  In 4 months Johnson, Megan P, DO [facility], [facility]   In 5 months  [facility], [facility]            carvedilol (COREG) 25 MG tablet [employer] Med Name: carvedilol 25 mg tablet] 180 tablet 1    Sig: TAKE ONE TABLET BY MOUTH TWICE DAILY with A meal     Cardiovascular: Beta Blockers 3 Failed - 11/14/2021 12:57 PM      Failed - Cr in normal range and within 360 days    Creatinine  Date Value Ref Range Status  06/02/2014 1.32 (H) 0.60 - 1.30 mg/dL Final   Creatinine, Ser  Date Value Ref Range Status  10/10/2021 1.27 (H) 0.57 - 1.00 mg/dL Final         Passed - AST in normal range and within 360 days    AST  Date Value Ref Range Status  10/10/2021 26 0 - 40 IU/L Final   AST (SGOT) [place], Waived  Date Value Ref Range Status  10/29/2018 31 11 - 38 U/L Final         Passed - ALT in normal range and within 360 days    ALT  Date Value Ref Range Status  10/10/2021 22 0 - 32 IU/L Final   ALT (SGPT) [place], Waived  Date Value Ref Range Status  10/29/2018 28 10 - 47 U/L Final         Passed - Last BP in normal range    BP Readings from Last 1 Encounters:  10/10/21 121/67         Passed  - Last Heart Rate in normal range    Pulse Readings from Last 1 Encounters:  10/10/21 89         Passed - Valid encounter within last 6 months    Recent Outpatient Visits          1 month ago Routine general medical examination at a health care facility   [facility], [place], DO   6 months ago COVID-19   [employer], Lauren A, NP   7 months ago Benign hypertensive renal disease   [facility] [place], Megan P, DO   1 year ago Routine general medical examination at a health care facility   [facility], [place], DO   1 year ago Other conjunctivitis of right eye   [place], [place], NP      Future Appointments            In 4 months Johnson, Megan P, DO [place], [facility]   In 5 months  [facility], [facility]            levothyroxine (SYNTHROID) 150 MCG tablet [employer] Med Name: levothyroxine 150 mcg tablet] 90 tablet 1    Sig: TAKE ONE TABLET BY MOUTH BEFORE BREAKFAST     Endocrinology:  Hypothyroid Agents Passed - 11/14/2021 12:57 PM      Passed - TSH in normal range and within 360 days    TSH  Date Value Ref Range Status  10/10/2021 3.540 0.450 - 4.500 uIU/mL Final         Passed - Valid encounter within last 12 months    Recent Outpatient Visits          1 month ago Routine general medical examination at a health care facility   [facility], Megan P, DO   6 months ago  [place], NP   7 months ago Benign hypertensive renal disease   [facility] [place], Megan P, DO   1 year ago Routine general medical examination at a health care facility   [facility], [place], DO   1 year ago Other conjunctivitis of right eye   [facility] Kathrine Haddock, NP      Future Appointments            In 4 months Johnson, Megan P, DO [place],  [place]   In 5 months  [place], [facility]            amLODipine ([place]) 10 MG tablet [employer] Med Name: amlodipine 10 mg tablet] 90 tablet 1    Sig: TAKE ONE TABLET BY MOUTH EVERY MORNING     Cardiovascular: Calcium Channel Blockers 2 Passed - 11/14/2021 12:57 PM      Passed - Last BP in normal range    BP Readings from Last 1 Encounters:  10/10/21 121/67         Passed - Last Heart Rate in normal range    Pulse Readings from Last 1 Encounters:  10/10/21 89         Passed - Valid encounter within last 6 months    Recent Outpatient Visits          1 month ago Routine general medical examination at a health care facility   [facility], [place], DO   6 months ago [place], Lauren A, NP   7 months ago Benign hypertensive renal disease   [place], [place], DO   1 year ago Routine general medical examination at a health care facility   [facility] [place], [place], DO   1 year ago Other conjunctivitis of right eye   [facility] Kathrine Haddock, NP      Future Appointments            In 4 months Wynetta Emery, Barb Merino, DO [place], [place]   In 5 months  [employer], [place]            .

## 2021-11-19 ENCOUNTER — Telehealth: Payer: Self-pay

## 2021-11-19 NOTE — Chronic Care Management (AMB) (Signed)
Chronic Care Management Pharmacy Assistant   Name: EFFA YARROW  MRN: [id number] DOB: 07/01/1941  Reason for Encounter: Dispensing call/Medication Coordination call    Medications: Outpatient Encounter Medications as of 11/19/2021  Medication Sig   albuterol (VENTOLIN HFA) 108 (90 Base) MCG/ACT inhaler Inhale 2 puffs into the lungs every 6 (six) hours as needed for wheezing or shortness of breath.   amLODipine (NORVASC) 10 MG tablet Take 1 tablet (10 mg total) by mouth every morning.   benazepril (LOTENSIN) 40 MG tablet Take 1 tablet (40 mg total) by mouth daily.   Biotin w/ Vitamins C & E (HAIR/SKIN/NAILS PO) Take 1 tablet by mouth daily.   brimonidine (ALPHAGAN) 0.2 % ophthalmic solution 1 drop 2 (two) times daily.   carvedilol (COREG) 25 MG tablet Take 1 tablet (25 mg total) by mouth 2 (two) times daily with a meal.   cloNIDine (CATAPRES) 0.1 MG tablet TAKE ONE TABLET BY MOUTH EVERY MORNING and TAKE ONE TABLET BY MOUTH EVERY EVENING   dorzolamide-timolol (COSOPT) 22.3-6.8 MG/ML ophthalmic solution Place 1 drop into both eyes 2 (two) times daily.    Dulaglutide (TRULICITY) 1.5 AS/3.4HD SOPN Inject 1.5 mg into the skin once a week.   fexofenadine (ALLEGRA) 180 MG tablet Take 180 mg by mouth daily.   fluticasone (FLONASE) 50 MCG/ACT nasal spray Place 2 sprays into both nostrils daily.   furosemide (LASIX) 20 MG tablet Take 20 mg by mouth daily.   glucose blood (ONETOUCH ULTRA) test strip 1 each by Other route as needed for other. Use as instructed   latanoprost (XALATAN) 0.005 % ophthalmic solution Place 1 drop into both eyes at bedtime.    levothyroxine (SYNTHROID) 150 MCG tablet TAKE ONE TABLET BY MOUTH BEFORE BREAKFAST   lovastatin (MEVACOR) 40 MG tablet Take 1 tablet (40 mg total) by mouth at bedtime.   ONETOUCH DELICA LANCETS 62I MISC USE AS DIRECTED ONCE DAILY WITH TEST STRIPS E11.22   No facility-administered encounter medications on file as of 11/19/2021.   Reviewed chart for  medication changes ahead of medication coordination call.  No OVs, Consults, or hospital visits since last care coordination call/Pharmacist visit. (If appropriate, list visit date, provider name)  No medication changes indicated OR if recent visit, treatment plan here.  BP Readings from Last 3 Encounters:  10/10/21 121/67  08/27/21 120/67  05/18/21 (!) 148/57    Lab Results  Component Value Date   HGBA1C 6.0 (H) 10/10/2021     Patient obtains medications through Adherence Packaging  90 Days   Last adherence delivery included:  Levothyroxine 150 mcg once daily Amlodipine 10 mg once daily Benazepril 40 mg once daily Carvedilol 25 mg twice daily Clonidine 0.1 mg twice daily Furosemide 20 mg once daily Lovastatin 40 mg once daily Brimonidine drop 0.20% one drop in each eye twice daily dorzolamide-timolol (COSOPT) 22.3-6.8 MG/ML ophthalmic solution 1 drop in both eyes twice daily latanoprost (XALATAN) 0.005 % ophthalmic solution Place 1 drop into both eyes at bedtime Lancets and test stips One touch  Patient declined (meds) last month due to PRN use/additional supply on hand. N/a  Patient is due for next adherence delivery on: 11/27/2021. Called patient and reviewed medications and coordinated delivery.  This delivery to include: Clonidine 0.1 mg twice daily Amlodipine 10 mg once daily Levothyroxine 150 mcg once daily Furosemide 20 mg once daily Carvedilol 25 mg twice daily Benazepril 40 mg once daily Onetouch ultra test strips use directed   Patient states after he 90  days are completed she will be going back to her [employer].  Confirmed delivery date of 11/27/2021, advised patient that pharmacy will contact them the morning of delivery.  [name], [place]

## 2021-11-22 ENCOUNTER — Telehealth: Payer: Self-pay | Admitting: Family Medicine

## 2021-11-22 DIAGNOSIS — I2510 Atherosclerotic heart disease of native coronary artery without angina pectoris: Secondary | ICD-10-CM

## 2021-11-22 NOTE — Progress Notes (Signed)
I attempted to reach out to the patient due to [facility] pharmacy saying that they are unable to fill her medications due to her medications being sent to another pharmacy. I left a voicemail for pt to return phone call to confirm this.   [name], [place]

## 2021-11-22 NOTE — Telephone Encounter (Signed)
Medication Refill - Medication:  amLODipine (NORVASC) 10 MG tablet  benazepril (LOTENSIN) 40 MG tablet  carvedilol (COREG) 25 MG tablet  cloNIDine (CATAPRES) 0.1 MG tablet [[id number]]  fluticasone (FLONASE) 50 MCG/ACT nasal spray  lovastatin (MEVACOR) 40 MG tablet  Dulaglutide (TRULICITY) 1.5 WQ/9.4JL SOPN   Has the patient contacted their pharmacy? Yes.   Contact PCP  Preferred Pharmacy (with phone number or street name):  [facility] (N[facility] [address]  [place], [place] [address]  Phone:  [phone]  Fax:  [phone]   Has the patient been seen for an appointment in the last year OR does the patient have an upcoming appointment? Yes.    Agent: Please be advised that RX refills may take up to 3 business days. We ask that you follow-up with your pharmacy.

## 2021-11-22 NOTE — Telephone Encounter (Signed)
[facility] called and spoke to [place], [facility] about the refill(s) Amlodipine, Benazepril, Carvedilol, Clonidine, Flonase, Lovastatin, Trulicity requested. Advised they were all sent on 10/10/21. She says they have received and all went through for refill except Lovastatin, which can be refilled on 11/28/21.

## 2021-11-28 ENCOUNTER — Other Ambulatory Visit: Payer: Self-pay | Admitting: Family Medicine

## 2021-11-28 DIAGNOSIS — E039 Hypothyroidism, unspecified: Secondary | ICD-10-CM

## 2021-11-28 NOTE — Telephone Encounter (Signed)
Requested Prescriptions  Pending Prescriptions Disp Refills   levothyroxine (SYNTHROID) 150 MCG tablet [Pharmacy Med Name: Levothyroxine Sodium 150 MCG Oral Tablet] 90 tablet 0    Sig: TAKE 1 TABLET BY MOUTH ONCE DAILY BEFORE BREAKFAST     Endocrinology:  Hypothyroid Agents Passed - 11/28/2021  9:25 AM      Passed - TSH in normal range and within 360 days    TSH  Date Value Ref Range Status  10/10/2021 3.540 0.450 - 4.500 uIU/mL Final         Passed - Valid encounter within last 12 months    Recent Outpatient Visits          1 month ago Routine general medical examination at a health care facility   [facility], Megan P, DO   6 months ago COVID-19   [facility], Lauren A, NP   7 months ago Benign hypertensive renal disease   [place], [place], DO   1 year ago Routine general medical examination at a health care facility   [facility], [place], DO   1 year ago Other conjunctivitis of right eye   [facility] Kathrine Haddock, NP      Future Appointments            In 4 months Wynetta Emery, Barb Merino, DO [place], [place]   In 5 months  [facility], [place]

## 2022-01-07 DIAGNOSIS — H401132 Primary open-angle glaucoma, bilateral, moderate stage: Secondary | ICD-10-CM | POA: Diagnosis not present

## 2022-01-08 ENCOUNTER — Other Ambulatory Visit: Payer: Self-pay

## 2022-01-08 ENCOUNTER — Ambulatory Visit
Admission: RE | Admit: 2022-01-08 | Discharge: 2022-01-08 | Disposition: A | Discharge status: Home / Self Care | Payer: PPO | Source: Ambulatory Visit | Attending: Family Medicine | Admitting: Family Medicine

## 2022-01-08 DIAGNOSIS — E039 Hypothyroidism, unspecified: Secondary | ICD-10-CM | POA: Diagnosis not present

## 2022-01-08 DIAGNOSIS — M85852 Other specified disorders of bone density and structure, left thigh: Secondary | ICD-10-CM | POA: Diagnosis not present

## 2022-01-08 DIAGNOSIS — E119 Type 2 diabetes mellitus without complications: Secondary | ICD-10-CM | POA: Diagnosis not present

## 2022-01-08 DIAGNOSIS — Z780 Asymptomatic menopausal state: Secondary | ICD-10-CM | POA: Diagnosis not present

## 2022-01-08 DIAGNOSIS — Z13820 Encounter for screening for osteoporosis: Secondary | ICD-10-CM | POA: Insufficient documentation

## 2022-01-29 DIAGNOSIS — N1832 Chronic kidney disease, stage 3b: Secondary | ICD-10-CM | POA: Diagnosis not present

## 2022-01-29 DIAGNOSIS — I700 Atherosclerosis of aorta: Secondary | ICD-10-CM | POA: Diagnosis not present

## 2022-01-29 DIAGNOSIS — I10 Essential (primary) hypertension: Secondary | ICD-10-CM | POA: Diagnosis not present

## 2022-01-29 DIAGNOSIS — I2510 Atherosclerotic heart disease of native coronary artery without angina pectoris: Secondary | ICD-10-CM | POA: Diagnosis not present

## 2022-01-29 DIAGNOSIS — E782 Mixed hyperlipidemia: Secondary | ICD-10-CM | POA: Diagnosis not present

## 2022-01-29 DIAGNOSIS — I071 Rheumatic tricuspid insufficiency: Secondary | ICD-10-CM | POA: Diagnosis not present

## 2022-01-29 DIAGNOSIS — I340 Nonrheumatic mitral (valve) insufficiency: Secondary | ICD-10-CM | POA: Diagnosis not present

## 2022-01-30 DIAGNOSIS — N1832 Chronic kidney disease, stage 3b: Secondary | ICD-10-CM | POA: Diagnosis not present

## 2022-02-05 DIAGNOSIS — R600 Localized edema: Secondary | ICD-10-CM | POA: Diagnosis not present

## 2022-02-05 DIAGNOSIS — I10 Essential (primary) hypertension: Secondary | ICD-10-CM | POA: Diagnosis not present

## 2022-02-05 DIAGNOSIS — E1122 Type 2 diabetes mellitus with diabetic chronic kidney disease: Secondary | ICD-10-CM | POA: Diagnosis not present

## 2022-02-05 DIAGNOSIS — N1832 Chronic kidney disease, stage 3b: Secondary | ICD-10-CM | POA: Diagnosis not present

## 2022-02-05 DIAGNOSIS — N281 Cyst of kidney, acquired: Secondary | ICD-10-CM | POA: Diagnosis not present

## 2022-04-10 ENCOUNTER — Ambulatory Visit (INDEPENDENT_AMBULATORY_CARE_PROVIDER_SITE_OTHER): Payer: PPO | Admitting: Family Medicine

## 2022-04-10 ENCOUNTER — Encounter: Payer: Self-pay | Admitting: Family Medicine

## 2022-04-10 VITALS — BP 156/80 | HR 81 | Temp 98.0°F | Wt 171.4 lb

## 2022-04-10 DIAGNOSIS — N1831 Chronic kidney disease, stage 3a: Secondary | ICD-10-CM | POA: Diagnosis not present

## 2022-04-10 DIAGNOSIS — E785 Hyperlipidemia, unspecified: Secondary | ICD-10-CM | POA: Diagnosis not present

## 2022-04-10 DIAGNOSIS — N183 Chronic kidney disease, stage 3 unspecified: Secondary | ICD-10-CM | POA: Diagnosis not present

## 2022-04-10 DIAGNOSIS — D631 Anemia in chronic kidney disease: Secondary | ICD-10-CM

## 2022-04-10 DIAGNOSIS — E1169 Type 2 diabetes mellitus with other specified complication: Secondary | ICD-10-CM | POA: Diagnosis not present

## 2022-04-10 DIAGNOSIS — E039 Hypothyroidism, unspecified: Secondary | ICD-10-CM | POA: Diagnosis not present

## 2022-04-10 DIAGNOSIS — I2510 Atherosclerotic heart disease of native coronary artery without angina pectoris: Secondary | ICD-10-CM | POA: Diagnosis not present

## 2022-04-10 DIAGNOSIS — K7460 Unspecified cirrhosis of liver: Secondary | ICD-10-CM

## 2022-04-10 DIAGNOSIS — E1122 Type 2 diabetes mellitus with diabetic chronic kidney disease: Secondary | ICD-10-CM | POA: Diagnosis not present

## 2022-04-10 DIAGNOSIS — D693 Immune thrombocytopenic purpura: Secondary | ICD-10-CM

## 2022-04-10 DIAGNOSIS — N2581 Secondary hyperparathyroidism of renal origin: Secondary | ICD-10-CM

## 2022-04-10 DIAGNOSIS — I700 Atherosclerosis of aorta: Secondary | ICD-10-CM | POA: Diagnosis not present

## 2022-04-10 DIAGNOSIS — G629 Polyneuropathy, unspecified: Secondary | ICD-10-CM

## 2022-04-10 DIAGNOSIS — I129 Hypertensive chronic kidney disease with stage 1 through stage 4 chronic kidney disease, or unspecified chronic kidney disease: Secondary | ICD-10-CM

## 2022-04-10 LAB — BAYER DCA HB A1C WAIVED: HB A1C (BAYER DCA - WAIVED): 6.5 % — ABNORMAL HIGH (ref 4.8–5.6)

## 2022-04-10 MED ORDER — LOVASTATIN 40 MG PO TABS: 40.0000 mg | ORAL_TABLET | Freq: Every day | ORAL | 1 refills | Status: DC

## 2022-04-10 MED ORDER — CLONIDINE HCL 0.1 MG PO TABS
ORAL_TABLET | ORAL | 1 refills | Status: DC
Start: 2022-04-10 — End: 2022-08-13

## 2022-04-10 MED ORDER — BENAZEPRIL HCL 40 MG PO TABS: 40.0000 mg | ORAL_TABLET | Freq: Every day | ORAL | 1 refills | Status: DC

## 2022-04-10 MED ORDER — GABAPENTIN 100 MG PO CAPS: 100.0000 mg | ORAL_CAPSULE | Freq: Every day | ORAL | 3 refills | Status: DC

## 2022-04-10 MED ORDER — CARVEDILOL 25 MG PO TABS: 25.0000 mg | ORAL_TABLET | Freq: Two times a day (BID) | ORAL | 1 refills | Status: DC

## 2022-04-10 MED ORDER — ALBUTEROL SULFATE HFA 108 (90 BASE) MCG/ACT IN AERS
2.0000 | INHALATION_SPRAY | Freq: Four times a day (QID) | RESPIRATORY_TRACT | 6 refills | Status: DC | PRN
Start: 2022-04-10 — End: 2022-11-13

## 2022-04-10 MED ORDER — AMLODIPINE BESYLATE 10 MG PO TABS: 10.0000 mg | ORAL_TABLET | Freq: Every morning | ORAL | 1 refills | Status: DC

## 2022-04-10 NOTE — Assessment & Plan Note (Signed)
Will keep BP and cholesterol and sugars under good control. Continue to monitor. Call with any concerns.  

## 2022-04-10 NOTE — Progress Notes (Signed)
BP (!) 156/80   Pulse 81   Temp 98 F (36.7 C)   Wt 171 lb 6.4 oz (77.7 kg)   SpO2 96%   BMI 32.39 kg/m    Subjective:    Patient ID: Julia Nielsen, female    DOB: 1941-05-23, 81 y.o.   MRN: [id number]  HPI: Julia Nielsen is a 81 y.o. female  Chief Complaint  Patient presents with   Atherosclerosis of aorta   Diabetes   Hyperlipidemia   Hypothyroidism   Chronic Kidney Disease   DIABETES Hypoglycemic episodes:no Polydipsia/polyuria: no Visual disturbance: no Chest pain: no Paresthesias: yes Glucose Monitoring: yes  Accucheck frequency: Daily Taking Insulin?: no Blood Pressure Monitoring: not checking Retinal Examination: Up to Date Foot Exam: Up to Date Diabetic Education: Completed Pneumovax: Up to Date Influenza: Up to Date Aspirin: yes  HYPERTENSION / HYPERLIPIDEMIA Satisfied with current treatment? yes Duration of hypertension: chronic BP monitoring frequency: not checking BP medication side effects: no Past BP meds: amlodipine, lasix, carvedilol, benazepril, clonidine Duration of hyperlipidemia: chronic Cholesterol medication side effects: no Cholesterol supplements: none Past cholesterol medications: lovastatin Medication compliance: excellent compliance Aspirin: no Recent stressors: no Recurrent headaches: no Visual changes: no Palpitations: no Dyspnea: no Chest pain: no Lower extremity edema: no Dizzy/lightheaded: no  HYPOTHYROIDISM Thyroid control status:controlled Satisfied with current treatment? 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 Interim medical history since our last visit reviewed. Allergies and medications reviewed and updated.  Review of Systems  Constitutional: Negative.   Respiratory: Negative.    Cardiovascular: Negative.   Musculoskeletal: Negative.   Neurological:  Positive for numbness. Negative for dizziness, tremors, seizures, syncope, facial asymmetry, speech difficulty, weakness, light-headedness and headaches.  Psychiatric/Behavioral: Negative.      Per HPI unless specifically indicated above     Objective:    BP (!) 156/80   Pulse 81   Temp 98 F (36.7 C)   Wt 171 lb 6.4 oz (77.7 kg)   SpO2 96%   BMI 32.39 kg/m   Wt Readings from Last 3 Encounters:  04/10/22 171 lb 6.4 oz (77.7 kg)  10/10/21 172 lb 3.2 oz (78.1 kg)  05/18/21 170 lb 13.7 oz (77.5 kg)    Physical Exam Vitals and nursing note reviewed.  Constitutional:      General: She is not in acute distress.    Appearance: Normal appearance. She is obese. She is not ill-appearing, toxic-appearing or diaphoretic.  HENT:     Head: Normocephalic and atraumatic.     Right Ear: External ear normal.     Left Ear: External ear normal.     Nose: Nose normal.     Mouth/Throat:     Mouth: Mucous membranes are moist.     Pharynx: Oropharynx is clear.  Eyes:     General: No scleral icterus.       Right eye: No discharge.  Left eye: No discharge.     Extraocular Movements: Extraocular movements intact.     Conjunctiva/sclera: Conjunctivae normal.     Pupils: Pupils are equal, round, and reactive to light.  Cardiovascular:     Rate and Rhythm: Normal rate and regular rhythm.     Pulses: Normal pulses.      Heart sounds: Normal heart sounds. No murmur heard.    No friction rub. No gallop.  Pulmonary:     Effort: Pulmonary effort is normal. No respiratory distress.     Breath sounds: Normal breath sounds. No stridor. No wheezing, rhonchi or rales.  Chest:     Chest wall: No tenderness.  Musculoskeletal:        General: Normal range of motion.     Cervical back: Normal range of motion and neck supple.  Skin:    General: Skin is warm and dry.     Capillary Refill: Capillary refill takes less than 2 seconds.     Coloration: Skin is not jaundiced or pale.     Findings: No bruising, erythema, lesion or rash.  Neurological:     General: No focal deficit present.     Mental Status: She is alert and oriented to person, place, and time. Mental status is at baseline.  Psychiatric:        Mood and Affect: Mood normal.        Behavior: Behavior normal.        Thought Content: Thought content normal.        Judgment: Judgment normal.     Results for orders placed or performed in visit on 04/10/22  Bayer DCA Hb A1c Waived  Result Value Ref Range   HB A1C (BAYER DCA - WAIVED) 6.5 (H) 4.8 - 5.6 %      Assessment & Plan:   Problem List Items Addressed This Visit       Cardiovascular and Mediastinum   CAD (coronary artery disease) - Primary    Will keep BP and cholesterol and sugars under good control. Continue to monitor. Call with any concerns.       Relevant Medications   lovastatin (MEVACOR) 40 MG tablet   cloNIDine (CATAPRES) 0.1 MG tablet   carvedilol (COREG) 25 MG tablet   benazepril (LOTENSIN) 40 MG tablet   amLODipine (NORVASC) 10 MG tablet   Other Relevant Orders   Comprehensive metabolic panel   Atherosclerosis of aorta (HCC)    Will keep BP and cholesterol and sugars under good control. Continue to monitor. Call with any concerns.       Relevant Medications   lovastatin (MEVACOR) 40 MG tablet   cloNIDine (CATAPRES) 0.1 MG tablet   carvedilol (COREG) 25 MG tablet    benazepril (LOTENSIN) 40 MG tablet   amLODipine (NORVASC) 10 MG tablet     Digestive   Cirrhosis of liver without ascites (HCC)    Rechecking labs today. Await results. Treat as needed.         Endocrine   Hyperlipidemia associated with type 2 diabetes mellitus ([place])    Under good control on current regimen. Continue current regimen. Continue to monitor. Call with any concerns. Refills given. Labs drawn today.        Relevant Medications   lovastatin (MEVACOR) 40 MG tablet   cloNIDine (CATAPRES) 0.1 MG tablet   carvedilol (COREG) 25 MG tablet   benazepril (LOTENSIN) 40 MG tablet   amLODipine (NORVASC) 10 MG tablet   Dulaglutide (TRULICITY) 1.5 QJ/1.9ER SOPN  Other Relevant Orders   Comprehensive metabolic panel   Lipid Panel w/o Chol/HDL Ratio   Type 2 diabetes mellitus with stage 3 chronic kidney disease ([place])    Doing well with a1c of 6.5. Continue current regimen. Continue to monitor. Call with any concerns.       Relevant Medications   lovastatin (MEVACOR) 40 MG tablet   benazepril (LOTENSIN) 40 MG tablet   Dulaglutide (TRULICITY) 1.5 SW/9.6PR SOPN   Other Relevant Orders   Comprehensive metabolic panel   Bayer DCA Hb A1c Waived (Completed)   Secondary hyperparathyroidism of renal origin ([place])    Under good control on current regimen. Continue current regimen. Continue to monitor. Call with any concerns. Refills given. Labs drawn today.       Relevant Orders   Comprehensive metabolic panel   PTH, Intact and Calcium   VITAMIN D 25 Hydroxy (Vit-D Deficiency, Fractures)   Hypothyroidism    Rechecking labs today. Await results. Treat as needed. Continue to monitor.       Relevant Medications   carvedilol (COREG) 25 MG tablet   Other Relevant Orders   TSH     Nervous and Auditory   Neuropathy    Will start her on gabapentin at bedtime and recheck 1 month. Call with any concerns. Continue to monitor.         Genitourinary   Benign hypertensive renal disease     Under good control on current regimen. Continue current regimen. Continue to monitor. Call with any concerns. Refills given. Labs drawn today.       Relevant Orders   Comprehensive metabolic panel   Anemia of chronic renal failure   Relevant Orders   Comprehensive metabolic panel   CBC with Differential/Platelet   CKD (chronic kidney disease) stage 3, GFR 30-59 ml/min (HCC)    Rechecking labs today. Await results. Treat as needed.         Hematopoietic and Hemostatic   Idiopathic thrombocytopenia ([place])    Rechecking labs today. Await results. Treat as needed.       Relevant Orders   Comprehensive metabolic panel   CBC with Differential/Platelet     Follow up plan: Return in about 4 weeks (around 05/08/2022) for follow up neuropathy.

## 2022-04-10 NOTE — Assessment & Plan Note (Signed)
Under good control on current regimen. Continue current regimen. Continue to monitor. Call with any concerns. Refills given. Labs drawn today.   

## 2022-04-10 NOTE — Assessment & Plan Note (Signed)
Will start her on gabapentin at bedtime and recheck 1 month. Call with any concerns. Continue to monitor.

## 2022-04-10 NOTE — Assessment & Plan Note (Signed)
Rechecking labs today. Await results. Treat as needed. Continue to monitor.  °

## 2022-04-10 NOTE — Assessment & Plan Note (Signed)
Rechecking labs today. Await results. Treat as needed.  °

## 2022-04-10 NOTE — Assessment & Plan Note (Signed)
Doing well with a1c of 6.5. Continue current regimen. Continue to monitor. Call with any concerns.

## 2022-04-11 ENCOUNTER — Other Ambulatory Visit: Payer: Self-pay | Admitting: Family Medicine

## 2022-04-11 DIAGNOSIS — E039 Hypothyroidism, unspecified: Secondary | ICD-10-CM

## 2022-04-11 LAB — COMPREHENSIVE METABOLIC PANEL
ALT: 25 IU/L (ref 0–32)
AST: 23 IU/L (ref 0–40)
Albumin/Globulin Ratio: 1.8 (ref 1.2–2.2)
Albumin: 4.2 g/dL (ref 3.6–4.6)
Alkaline Phosphatase: 66 IU/L (ref 44–121)
BUN/Creatinine Ratio: 13 (ref 12–28)
BUN: 16 mg/dL (ref 8–27)
Bilirubin Total: 0.7 mg/dL (ref 0.0–1.2)
CO2: 22 mmol/L (ref 20–29)
Calcium: 9.5 mg/dL (ref 8.7–10.3)
Chloride: 105 mmol/L (ref 96–106)
Creatinine, Ser: 1.28 mg/dL — ABNORMAL HIGH (ref 0.57–1.00)
Globulin, Total: 2.3 g/dL (ref 1.5–4.5)
Glucose: 185 mg/dL — ABNORMAL HIGH (ref 70–99)
Potassium: 4.4 mmol/L (ref 3.5–5.2)
Sodium: 141 mmol/L (ref 134–144)
Total Protein: 6.5 g/dL (ref 6.0–8.5)
eGFR: 42 mL/min/{1.73_m2} — ABNORMAL LOW (ref >59)

## 2022-04-11 LAB — CBC WITH DIFFERENTIAL/PLATELET
Basophils Absolute: 0 10*3/uL (ref 0.0–0.2)
Basos: 1 %
EOS (ABSOLUTE): 0.1 10*3/uL (ref 0.0–0.4)
Eos: 3 %
Hematocrit: 41.2 % (ref 34.0–46.6)
Hemoglobin: 13.1 g/dL (ref 11.1–15.9)
Immature Grans (Abs): 0 10*3/uL (ref 0.0–0.1)
Immature Granulocytes: 0 %
Lymphocytes Absolute: 1.3 10*3/uL (ref 0.7–3.1)
Lymphs: 36 %
MCH: 28.9 pg (ref 26.6–33.0)
MCHC: 31.8 g/dL (ref 31.5–35.7)
MCV: 91 fL (ref 79–97)
Monocytes Absolute: 0.3 10*3/uL (ref 0.1–0.9)
Monocytes: 9 %
Neutrophils Absolute: 1.9 10*3/uL (ref 1.4–7.0)
Neutrophils: 51 %
Platelets: 88 10*3/uL — CL (ref 150–450)
RBC: 4.54 x10E6/uL (ref 3.77–5.28)
RDW: 13.1 % (ref 11.7–15.4)
WBC: 3.6 10*3/uL (ref 3.4–10.8)

## 2022-04-11 LAB — LIPID PANEL W/O CHOL/HDL RATIO
Cholesterol, Total: 129 mg/dL (ref 100–199)
HDL: 52 mg/dL (ref >39)
LDL Chol Calc (NIH): 53 mg/dL (ref 0–99)
Triglycerides: 138 mg/dL (ref 0–149)
VLDL Cholesterol Cal: 24 mg/dL (ref 5–40)

## 2022-04-11 LAB — TSH: TSH: 6.08 u[IU]/mL — ABNORMAL HIGH (ref 0.450–4.500)

## 2022-04-11 LAB — VITAMIN D 25 HYDROXY (VIT D DEFICIENCY, FRACTURES): Vit D, 25-Hydroxy: 43.4 ng/mL (ref 30.0–100.0)

## 2022-04-11 LAB — PTH, INTACT AND CALCIUM: PTH: 24 pg/mL (ref 15–65)

## 2022-04-11 MED ORDER — LEVOTHYROXINE SODIUM 175 MCG PO TABS: 175.0000 ug | ORAL_TABLET | Freq: Every day | ORAL | 1 refills | Status: DC

## 2022-04-12 ENCOUNTER — Other Ambulatory Visit: Payer: Self-pay

## 2022-04-12 DIAGNOSIS — E1122 Type 2 diabetes mellitus with diabetic chronic kidney disease: Secondary | ICD-10-CM

## 2022-04-12 MED ORDER — ONETOUCH DELICA LANCETS 33G MISC: 12 refills | Status: AC

## 2022-04-12 MED ORDER — ONETOUCH ULTRA VI STRP: 1.0000 | ORAL_STRIP | Freq: Every day | 6 refills | Status: DC

## 2022-04-12 MED ORDER — ONETOUCH ULTRA 2 W/DEVICE KIT: 1.0000 | PACK | Freq: Every day | 0 refills | Status: DC

## 2022-05-06 ENCOUNTER — Ambulatory Visit (INDEPENDENT_AMBULATORY_CARE_PROVIDER_SITE_OTHER): Payer: PPO | Admitting: *Deleted

## 2022-05-06 DIAGNOSIS — Z0000 Encounter for general adult medical examination without abnormal findings: Secondary | ICD-10-CM

## 2022-05-06 NOTE — Progress Notes (Signed)
Subjective:   Julia Nielsen is a 81 y.o. female who presents for Medicare Annual (Subsequent) preventive examination.  I connected with  Julia Nielsen on 05/06/22 by a  telephone enabled telemedicine application and verified that I am speaking with the correct person using two identifiers.   I discussed the limitations of evaluation and management by telemedicine. The patient expressed understanding and agreed to proceed.  Patient location: home  Provider location: Tele-health-home    Review of Systems     Cardiac Risk Factors include: advanced age (>61men, >51 women);diabetes mellitus     Objective:    Today's Vitals   There is no height or weight on file to calculate BMI.     05/06/2022    8:35 AM 05/18/2021    9:01 AM 05/04/2021    9:02 AM 05/01/2020   10:33 AM 04/19/2019    8:55 AM 08/12/2018    8:12 AM 04/10/2018    9:38 AM  Advanced Directives  Does Patient Have a Medical Advance Directive? No No No No No No No  Would patient like information on creating a medical advance directive? No - Patient declined No - Patient declined    No - Patient declined Yes (MAU/Ambulatory/Procedural Areas - Information given)    Current Medications (verified) Outpatient Encounter Medications as of 05/06/2022  Medication Sig   albuterol (VENTOLIN HFA) 108 (90 Base) MCG/ACT inhaler Inhale 2 puffs into the lungs every 6 (six) hours as needed for wheezing or shortness of breath.   amLODipine (NORVASC) 10 MG tablet Take 1 tablet (10 mg total) by mouth every morning.   benazepril (LOTENSIN) 40 MG tablet Take 1 tablet (40 mg total) by mouth daily.   Biotin w/ Vitamins C & E (HAIR/SKIN/NAILS PO) Take 1 tablet by mouth daily.   Blood Glucose Monitoring Suppl (ONE TOUCH ULTRA 2) w/Device KIT 1 each by Does not apply route daily.   brimonidine (ALPHAGAN) 0.2 % ophthalmic solution 1 drop 2 (two) times daily.   carvedilol (COREG) 25 MG tablet Take 1 tablet (25 mg total) by mouth 2 (two) times daily  with a meal.   cloNIDine (CATAPRES) 0.1 MG tablet TAKE ONE TABLET BY MOUTH EVERY MORNING and TAKE ONE TABLET BY MOUTH EVERY EVENING   dorzolamide-timolol (COSOPT) 22.3-6.8 MG/ML ophthalmic solution Place 1 drop into both eyes 2 (two) times daily.    Dulaglutide (TRULICITY) 1.5 EX/5.1ZG SOPN Inject 1.5 mg into the skin once a week.   fexofenadine (ALLEGRA) 180 MG tablet Take 180 mg by mouth daily.   fluticasone (FLONASE) 50 MCG/ACT nasal spray Place 2 sprays into both nostrils daily.   furosemide (LASIX) 20 MG tablet Take 20 mg by mouth daily.   gabapentin (NEURONTIN) 100 MG capsule Take 1 capsule (100 mg total) by mouth at bedtime.   glucose blood (ONETOUCH ULTRA) test strip 1 each by Other route daily. Use as instructed   latanoprost (XALATAN) 0.005 % ophthalmic solution Place 1 drop into both eyes at bedtime.    levothyroxine (SYNTHROID) 175 MCG tablet Take 1 tablet (175 mcg total) by mouth daily before breakfast.   lovastatin (MEVACOR) 40 MG tablet Take 1 tablet (40 mg total) by mouth at bedtime.   OneTouch Delica Lancets 01V MISC USE AS DIRECTED ONCE DAILY WITH TEST STRIPS E11.22   No facility-administered encounter medications on file as of 05/06/2022.    Allergies (verified) Prednisone   History: Past Medical History:  Diagnosis Date   Arthritis    OSTEO OF KNEE  Breast cancer ([place]) 2000   left breast cancer, radiation tx's.   CAD (coronary artery disease)    Carotid artery stenosis    bilateral   Cataract    Chronic kidney disease    Chronic stage 3   Coronary heart disease    Diabetes mellitus without complication (HCC)    Edema of both legs    GERD (gastroesophageal reflux disease)    Glaucoma    Hyperlipemia    mixed   Hyperlipidemia    Hypertension    Hypothyroidism    Mitral insufficiency    moderate   Osteopenia    Osteopenia    Personal history of radiation therapy 2000   LEFT lumpectomy   Stenosis of carotid artery    Thrombocytopenia (HCC)     Tricuspid insufficiency    moderate   Past Surgical History:  Procedure Laterality Date   APPENDECTOMY     BLADDER SURGERY     BREAST EXCISIONAL BIOPSY Left 2000   breast ca   BREAST LUMPECTOMY Left    BREAST SURGERY     CARDIAC CATHETERIZATION     CARPAL TUNNEL RELEASE Bilateral    CHOLECYSTECTOMY     COLONOSCOPY     COLONOSCOPY WITH PROPOFOL N/A 08/12/2018   Procedure: COLONOSCOPY WITH PROPOFOL;  Surgeon: Manya Silvas, MD;  Location: [facility] ENDOSCOPY;  Service: Endoscopy;  Laterality: N/A;   JOINT REPLACEMENT Right 2015   knee   parathyroid surgery     SHOULDER SURGERY Right    TUBAL LIGATION     Family History  Problem Relation Age of Onset   Gallbladder disease Mother    Aneurysm Mother    COPD Father    Congestive Heart Failure Father    Emphysema Father    Leukemia Sister    Stroke Brother    Dementia Brother    Alzheimer's disease Brother    Congestive Heart Failure Sister    Emphysema Sister    Breast cancer Neg Hx    Social History   Socioeconomic History   Marital status: Married    Spouse name: Not on file   Number of children: Not on file   Years of education: Not on file   Highest education level: High school graduate  Occupational History   Occupation: retired  Tobacco Use   Smoking status: Never   Smokeless tobacco: Never  Vaping Use   Vaping Use: Never used  Substance and Sexual Activity   Alcohol use: No   Drug use: No   Sexual activity: Not Currently  Other Topics Concern   Not on file  Social History Narrative   Not on file   Social Determinants of Health   Financial Resource Strain: Low Risk  (05/06/2022)   Overall Financial Resource Strain (CARDIA)    Difficulty of Paying Living Expenses: Not hard at all  Food Insecurity: No Food Insecurity (05/06/2022)   Hunger Vital Sign    Worried About Running Out of Food in the Last Year: Never true    Ran Out of Food in the Last Year: Never true  Transportation Needs: No Transportation  Needs (05/06/2022)   PRAPARE - Hydrologist (Medical): No    Lack of Transportation (Non-Medical): No  Physical Activity: Sufficiently Active (05/06/2022)   Exercise Vital Sign    Days of Exercise per Week: 5 days    Minutes of Exercise per Session: 50 min  Stress: No Stress Concern Present (05/06/2022)   [employer]  of Occupational Health - Occupational Stress Questionnaire    Feeling of Stress : Not at all  Social Connections: Moderately Integrated (05/06/2022)   Social Connection and Isolation Panel [NHANES]    Frequency of Communication with Friends and Family: Three times a week    Frequency of Social Gatherings with Friends and Family: Once a week    Attends Religious Services: More than 4 times per year    Active Member of [employer] or Organizations: No    Attends Music therapist: Never    Marital Status: Married    Tobacco Counseling Counseling given: Not Answered   Clinical Intake:  Pre-visit preparation completed: Yes  Pain : No/denies pain     Nutritional Risks: None Diabetes: Yes CBG done?: No Did pt. bring in CBG monitor from home?: No  How often do you need to have someone help you when you read instructions, pamphlets, or other written materials from your doctor or pharmacy?: 1 - Never  Diabetic?  Yes  Nutrition Risk Assessment:  Has the patient had any N/V/D within the last 2 months?  No  Does the patient have any non-healing wounds?  No  Has the patient had any unintentional weight loss or weight gain?  No   Diabetes:  Is the patient diabetic?  Yes  If diabetic, was a CBG obtained today?  No  Did the patient bring in their glucometer from home?  No  How often do you monitor your CBG's? 1 x a day.   Financial Strains and Diabetes Management:  Are you having any financial strains with the device, your supplies or your medication? No .  Does the patient want to be seen by Chronic Care Management for  management of their diabetes?  No  Would the patient like to be referred to a Nutritionist or for Diabetic Management?  No   Diabetic Exams:  Diabetic Eye Exam: .  Pt has been advised about the importance in completing this exam.  Diabetic Foot Exam:. Pt has been advised about the importance in completing this exam  Interpreter Needed?: No  Information entered by :: Leroy Kennedy LPN   Activities of Daily Living    05/06/2022    8:42 AM 10/10/2021    8:20 AM  In your present state of health, do you have any difficulty performing the following activities:  Hearing? 0 0  Vision? 0 0  Difficulty concentrating or making decisions? 0 0  Walking or climbing stairs? 0 0  Dressing or bathing? 0 0  Doing errands, shopping? 0 0  Preparing Food and eating ? N   Using the Toilet? N   In the past six months, have you accidently leaked urine? N   Do you have problems with loss of bowel control? N   Managing your Medications? N   Managing your Finances? N     Patient Care Team: Valerie Roys, DO as PCP - General (Family Medicine) Vladimir Faster, [facility] (Inactive) as Pharmacist (Pharmacist)  Indicate any recent Medical Services you may have received from other than [facility] providers in the past year (date may be approximate).     Assessment:   This is a routine wellness examination for Julia Nielsen.  Hearing/Vision screen Hearing Screening - Comments:: No trouble hearing Vision Screening - Comments:: Up to date [facility]  Dietary issues and exercise activities discussed: Current Exercise Habits: Home exercise routine, Time (Minutes): 50, Frequency (Times/Week): 5, Weekly Exercise (Minutes/Week): 250, Intensity: Moderate   Goals  Addressed             This Visit's Progress    Patient Stated       Continue current lifestyle       Depression Screen    05/06/2022    8:41 AM 10/10/2021    8:22 AM 05/04/2021    9:03 AM 10/04/2020    8:10 AM 07/27/2020    1:30 PM 05/01/2020    10:35 AM 09/16/2019    8:34 AM  PHQ 2/9 Scores  PHQ - 2 Score 0 0 0 0 0 0 0  PHQ- 9 Score  0    0     Fall Risk    05/06/2022    8:38 AM 10/10/2021    8:20 AM 05/04/2021    9:03 AM 10/04/2020    8:09 AM 05/09/2020    1:05 PM  Fall Risk   Falls in the past year? 0 0 0 0 0  Comment     Emmi Telephone Survey: data to providers prior to load  Number falls in past yr: 0 0  0   Injury with Fall? 0 0  0   Risk for fall due to :  No Fall Risks Medication side effect No Fall Risks   Follow up Falls evaluation completed;Education provided;Falls prevention discussed Falls evaluation completed Falls evaluation completed;Education provided;Falls prevention discussed Falls evaluation completed     FALL RISK PREVENTION PERTAINING TO THE HOME:  Any stairs in or around the home? Yes  If so, are there any without handrails? No  Home free of loose throw rugs in walkways, pet beds, electrical cords, etc? Yes  Adequate lighting in your home to reduce risk of falls? Yes   ASSISTIVE DEVICES UTILIZED TO PREVENT FALLS:  Life alert? No  Use of a cane, walker or w/c? Yes  Grab bars in the bathroom? Yes  Shower chair or bench in shower? No  Elevated toilet seat or a handicapped toilet? No   TIMED UP AND GO:  Was the test performed? No .    Cognitive Function:        05/06/2022    8:36 AM 05/04/2021    9:05 AM 05/01/2020   10:36 AM 06/22/2019    8:35 AM 04/19/2019    8:56 AM  6CIT Screen  What Year? 0 points 0 points 0 points 0 points 0 points  What month? 0 points 0 points 0 points 0 points 0 points  What time? 0 points 0 points 0 points 0 points 0 points  Count back from 20 0 points 0 points 0 points 0 points 0 points  Months in reverse 0 points 0 points 0 points 0 points 0 points  Repeat phrase 0 points 0 points 0 points 0 points 0 points  Total Score 0 points 0 points 0 points 0 points 0 points    Immunizations Immunization History  Administered Date(s) Administered   Fluad Quad(high  Dose 65+) 06/22/2019, 07/13/2020   Influenza, High Dose Seasonal PF 06/18/2016, 08/27/2017, 08/06/2018, 08/23/2021   Influenza,inj,Quad PF,6+ Mos 07/05/2015   Moderna Sars-Covid-2 Vaccination 02/29/2020, 03/28/2020, 10/10/2020   PNEUMOCOCCAL CONJUGATE-20 08/23/2021   Pneumococcal Conjugate-13 12/08/2014   Pneumococcal-Unspecified 08/15/1999, 06/18/2005   Td 01/23/2009, 10/04/2020    TDAP status: Up to date  Flu Vaccine status: Up to date  Pneumococcal vaccine status: Up to date  Covid-19 vaccine status: Information provided on how to obtain vaccines.   Qualifies for Shingles Vaccine? Yes   Zostavax completed No  Shingrix Completed?: No.    Education has been provided regarding the importance of this vaccine. Patient has been advised to call insurance company to determine out of pocket expense if they have not yet received this vaccine. Advised may also receive vaccine at local pharmacy or Health Dept. Verbalized acceptance and understanding.  Screening Tests Health Maintenance  Topic Date Due   COVID-19 Vaccine (4 - Moderna series) 05/22/2022 (Originally 12/05/2020)   Zoster Vaccines- Shingrix (1 of 2) 08/06/2022 (Originally [birth year]1989)   INFLUENZA VACCINE  05/14/2022   OPHTHALMOLOGY EXAM  07/09/2022   MAMMOGRAM  09/03/2022   HEMOGLOBIN A1C  10/10/2022   FOOT EXAM  04/11/2023   TETANUS/TDAP  10/04/2030   Pneumonia Vaccine 62+ Years old  Completed   DEXA SCAN  Completed   HPV VACCINES  Aged Out   COLONOSCOPY (Pts 45-54yrs Insurance coverage will need to be confirmed)  Discontinued    Health Maintenance  There are no preventive care reminders to display for this patient.   Colorectal cancer screening: No longer required.   Mammogram status: Completed  . Repeat every year  Bone Density status: Completed 2023. Results reflect: Bone density results: OSTEOPENIA. Repeat every 5 years.  Lung Cancer Screening: (Low Dose CT Chest recommended if Age 77-80 years, 30 pack-year  currently smoking OR have quit w/in 15years.) does not qualify.   Lung Cancer Screening Referral:   Additional Screening:  Hepatitis C Screening: does not qualify;  Vision Screening: Recommended annual ophthalmology exams for early detection of glaucoma and other disorders of the eye. Is the patient up to date with their annual eye exam?  Yes  Who is the provider or what is the name of the office in which the patient attends annual eye exams? [facility] If pt is not established with a provider, would they like to be referred to a provider to establish care? No .   Dental Screening: Recommended annual dental exams for proper oral hygiene  Community Resource Referral / Chronic Care Management: CRR required this visit?  No   CCM required this visit?  No      Plan:     I have personally reviewed and noted the following in the patient's chart:   Medical and social history Use of alcohol, tobacco or illicit drugs  Current medications and supplements including opioid prescriptions.  Functional ability and status Nutritional status Physical activity Advanced directives List of other physicians Hospitalizations, surgeries, and ER visits in previous 12 months Vitals Screenings to include cognitive, depression, and falls Referrals and appointments  In addition, I have reviewed and discussed with patient certain preventive protocols, quality metrics, and best practice recommendations. A written personalized care plan for preventive services as well as general preventive health recommendations were provided to patient.     Leroy Kennedy, LPN   9/[id number]   Nurse Notes:

## 2022-05-06 NOTE — Patient Instructions (Signed)
Ms. Julia Nielsen , Thank you for taking time to come for your Medicare Wellness Visit. I appreciate your ongoing commitment to your health goals. Please review the following plan we discussed and let me know if I can assist you in the future.   Screening recommendations/referrals: Colonoscopy: no longer indicated Mammogram: up to date Bone Density: up to date Recommended yearly ophthalmology/optometry visit for glaucoma screening and checkup Recommended yearly dental visit for hygiene and checkup  Vaccinations: Influenza vaccine: up to date Pneumococcal vaccine: up to date Tdap vaccine: up to date Shingles vaccine: Education provided    Advanced directives: Education provided  Conditions/risks identified:   Next appointment: 05-10-2022 @ 9:00 [facility] 65 Years and Older, Female Preventive care refers to lifestyle choices and visits with your health care provider that can promote health and wellness. What does preventive care include? A yearly physical exam. This is also called an annual well check. Dental exams once or twice a year. Routine eye exams. Ask your health care provider how often you should have your eyes checked. Personal lifestyle choices, including: Daily care of your teeth and gums. Regular physical activity. Eating a healthy diet. Avoiding tobacco and drug use. Limiting alcohol use. Practicing safe sex. Taking low-dose aspirin every day. Taking vitamin and mineral supplements as recommended by your health care provider. What happens during an annual well check? The services and screenings done by your health care provider during your annual well check will depend on your age, overall health, lifestyle risk factors, and family history of disease., overall health, lifestyle risk factors, and family history of disease. Counseling  Your health care provider may ask you questions about your: Alcohol use. Tobacco use. Drug use. Emotional well-being. Home and relationship well-being. Sexual activity. Eating  habits. History of falls. Memory and ability to understand (cognition). Work and work Statistician. Reproductive health. Screening  You may have the following tests or measurements: Height, weight, and BMI. Blood pressure. Lipid and cholesterol levels. These may be checked every 5 years, or more frequently if you are over 42 years old. Skin check. Lung cancer screening. You may have this screening every year starting at age 24 if you have a 30-pack-year history of smoking and currently smoke or have quit within the past 15 years. Fecal occult blood test (FOBT) of the stool. You may have this test every year starting at age 29. Flexible sigmoidoscopy or colonoscopy. You may have a sigmoidoscopy every 5 years or a colonoscopy every 10 years starting at age 23. Hepatitis C blood test. Hepatitis B blood test. Sexually transmitted disease (STD) testing. Diabetes screening. This is done by checking your blood sugar (glucose) after you have not eaten for a while (fasting). You may have this done every 1-3 years. Bone density scan. This is done to screen for osteoporosis. You may have this done starting at age 32. Mammogram. This may be done every 1-2 years. Talk to your health care provider about how often you should have regular mammograms. Talk with your health care provider about your test results, treatment options, and if necessary, the need for more tests. Vaccines  Your health care provider may recommend certain vaccines, such as: Influenza vaccine. This is recommended every year. Tetanus, diphtheria, and acellular pertussis (Tdap, Td) vaccine. You may need a Td booster every 10 years. Zoster vaccine. You may need this after age 61. Pneumococcal 13-valent conjugate (PCV13) vaccine. One dose is recommended after age 15. Pneumococcal polysaccharide (PPSV23) vaccine. One dose is recommended after age 35. Talk to your health care provider  about which screenings and vaccines you need and how  often you need them. This information is not intended to replace advice given to you by your health care provider. Make sure you discuss any questions you have with your health care provider. Document Released: 10/27/2015 Document Revised: 06/19/2016 Document Reviewed: 08/01/2015 Elsevier Interactive Patient Education  2017 [place] Prevention in the Home Falls can cause injuries. They can happen to people of all ages. There are many things you can do to make your home safe and to help prevent falls. What can I do on the outside of my home? Regularly fix the edges of walkways and driveways and fix any cracks. Remove anything that might make you trip as you walk through a door, such as a raised step or threshold. Trim any bushes or trees on the path to your home. Use bright outdoor lighting. Clear any walking paths of anything that might make someone trip, such as rocks or tools. Regularly check to see if handrails are loose or broken. Make sure that both sides of any steps have handrails. Any raised decks and porches should have guardrails on the edges. Have any leaves, snow, or ice cleared regularly. Use sand or salt on walking paths during winter. Clean up any spills in your garage right away. This includes oil or grease spills. What can I do in the bathroom? Use night lights. Install grab bars by the toilet and in the tub and shower. Do not use towel bars as grab bars. Use non-skid mats or decals in the tub or shower. If you need to sit down in the shower, use a plastic, non-slip stool. Keep the floor dry. Clean up any water that spills on the floor as soon as it happens. Remove soap buildup in the tub or shower regularly. Attach bath mats securely with double-sided non-slip rug tape. Do not have throw rugs and other things on the floor that can make you trip. What can I do in the bedroom? Use night lights. Make sure that you have a light by your bed that is easy to  reach. Do not use any sheets or blankets that are too big for your bed. They should not hang down onto the floor. Have a firm chair that has side arms. You can use this for support while you get dressed. Do not have throw rugs and other things on the floor that can make you trip. What can I do in the kitchen? Clean up any spills right away. Avoid walking on wet floors. Keep items that you use a lot in easy-to-reach places. If you need to reach something above you, use a strong step stool that has a grab bar. Keep electrical cords out of the way. Do not use floor polish or wax that makes floors slippery. If you must use wax, use non-skid floor wax. Do not have throw rugs and other things on the floor that can make you trip. What can I do with my stairs? Do not leave any items on the stairs. Make sure that there are handrails on both sides of the stairs and use them. Fix handrails that are broken or loose. Make sure that handrails are as long as the stairways. Check any carpeting to make sure that it is firmly attached to the stairs. Fix any carpet that is loose or worn. Avoid having throw rugs at the top or bottom of the stairs. If you do have throw rugs, attach them to the floor  with carpet tape. Make sure that you have a light switch at the top of the stairs and the bottom of the stairs. If you do not have them, ask someone to add them for you. What else can I do to help prevent falls? Wear shoes that: Do not have high heels. Have rubber bottoms. Are comfortable and fit you well. Are closed at the toe. Do not wear sandals. If you use a stepladder: Make sure that it is fully opened. Do not climb a closed stepladder. Make sure that both sides of the stepladder are locked into place. Ask someone to hold it for you, if possible. Clearly mark and make sure that you can see: Any grab bars or handrails. First and last steps. Where the edge of each step is. Use tools that help you move  around (mobility aids) if they are needed. These include: Canes. Walkers. Scooters. Crutches. Turn on the lights when you go into a dark area. Replace any light bulbs as soon as they burn out. Set up your furniture so you have a clear path. Avoid moving your furniture around. If any of your floors are uneven, fix them. If there are any pets around you, be aware of where they are. Review your medicines with your doctor. Some medicines can make you feel dizzy. This can increase your chance of falling. Ask your doctor what other things that you can do to help prevent falls. This information is not intended to replace advice given to you by your health care provider. Make sure you discuss any questions you have with your health care provider. Document Released: 07/27/2009 Document Revised: 03/07/2016 Document Reviewed: 11/04/2014 Elsevier Interactive Patient Education  2017 [employer].

## 2022-05-10 ENCOUNTER — Encounter: Payer: Self-pay | Admitting: Family Medicine

## 2022-05-10 ENCOUNTER — Ambulatory Visit (INDEPENDENT_AMBULATORY_CARE_PROVIDER_SITE_OTHER): Payer: PPO | Admitting: Family Medicine

## 2022-05-10 VITALS — BP 112/70 | HR 80 | Temp 97.7°F | Wt 169.5 lb

## 2022-05-10 DIAGNOSIS — G629 Polyneuropathy, unspecified: Secondary | ICD-10-CM | POA: Diagnosis not present

## 2022-05-10 MED ORDER — GABAPENTIN 100 MG PO CAPS
100.0000 mg | ORAL_CAPSULE | Freq: Every evening | ORAL | 3 refills | Status: DC | PRN
Start: 2022-05-10 — End: 2022-08-13

## 2022-05-10 NOTE — Progress Notes (Signed)
BP 112/70   Pulse 80   Temp 97.7 F (36.5 C)   Wt 169 lb 8 oz (76.9 kg)   SpO2 97%   BMI 32.03 kg/m    Subjective:    Patient ID: Julia Nielsen, female    DOB: 01-15-1941, 81 y.o.   MRN: [id number]  HPI: Julia Nielsen is a 81 y.o. female  Chief Complaint  Patient presents with   Peripheral Neuropathy    Patient states she took gabapentin for 2 weeks at bed time but felt so drowsy the next day so she stopped taking it. Patient states pain is pretty much gone.    NEUROPATHY Neuropathy status: better  Satisfied with current treatment?: yes Medication side effects: yes- fatigue Medication compliance:  stoppe Location: bilateral legs Pain: no Severity:  no pain   Quality:  numb and tingling Frequency: rarely Bilateral: no Symmetric: no Numbness: yes Decreased sensation: no Weakness: no Context: better  Relevant past medical, surgical, family and social history reviewed and updated as indicated. Interim medical history since our last visit reviewed. Allergies and medications reviewed and updated.  Review of Systems  Constitutional: Negative.   Respiratory: Negative.    Cardiovascular: Negative.   Gastrointestinal: Negative.   Musculoskeletal: Negative.   Neurological: Negative.   Psychiatric/Behavioral: Negative.      Per HPI unless specifically indicated above     Objective:    BP 112/70   Pulse 80   Temp 97.7 F (36.5 C)   Wt 169 lb 8 oz (76.9 kg)   SpO2 97%   BMI 32.03 kg/m   Wt Readings from Last 3 Encounters:  05/10/22 169 lb 8 oz (76.9 kg)  04/10/22 171 lb 6.4 oz (77.7 kg)  10/10/21 172 lb 3.2 oz (78.1 kg)    Physical Exam Vitals and nursing note reviewed.  Constitutional:      General: She is not in acute distress.    Appearance: Normal appearance. She is obese. She is not ill-appearing, toxic-appearing or diaphoretic.  HENT:     Head: Normocephalic and atraumatic.     Right Ear: External ear normal.     Left Ear: External ear normal.      Nose: Nose normal.     Mouth/Throat:     Mouth: Mucous membranes are moist.     Pharynx: Oropharynx is clear.  Eyes:     General: No scleral icterus.       Right eye: No discharge.        Left eye: No discharge.     Extraocular Movements: Extraocular movements intact.     Conjunctiva/sclera: Conjunctivae normal.     Pupils: Pupils are equal, round, and reactive to light.  Cardiovascular:     Rate and Rhythm: Normal rate and regular rhythm.     Pulses: Normal pulses.     Heart sounds: Normal heart sounds. No murmur heard.    No friction rub. No gallop.  Pulmonary:     Effort: Pulmonary effort is normal. No respiratory distress.     Breath sounds: Normal breath sounds. No stridor. No wheezing, rhonchi or rales.  Chest:     Chest wall: No tenderness.  Musculoskeletal:        General: Normal range of motion.     Cervical back: Normal range of motion and neck supple.  Skin:    General: Skin is warm and dry.     Capillary Refill: Capillary refill takes less than 2 seconds.     Coloration:  Skin is not jaundiced or pale.     Findings: No bruising, erythema, lesion or rash.  Neurological:     General: No focal deficit present.     Mental Status: She is alert and oriented to person, place, and time. Mental status is at baseline.  Psychiatric:        Mood and Affect: Mood normal.        Behavior: Behavior normal.        Thought Content: Thought content normal.        Judgment: Judgment normal.     Results for orders placed or performed in visit on 04/10/22  Comprehensive metabolic panel  Result Value Ref Range   Glucose 185 (H) 70 - 99 mg/dL   BUN 16 8 - 27 mg/dL   Creatinine, Ser 3.19 (H) 0.57 - 1.00 mg/dL   eGFR 42 (L) >71 SO/CLV/5.33   BUN/Creatinine Ratio 13 12 - 28   Sodium 141 134 - 144 mmol/L   Potassium 4.4 3.5 - 5.2 mmol/L   Chloride 105 96 - 106 mmol/L   CO2 22 20 - 29 mmol/L   Calcium 9.5 8.7 - 10.3 mg/dL   Total Protein 6.5 6.0 - 8.5 g/dL   Albumin 4.2 3.6  - 4.6 g/dL   Globulin, Total 2.3 1.5 - 4.5 g/dL   Albumin/Globulin Ratio 1.8 1.2 - 2.2   Bilirubin Total 0.7 0.0 - 1.2 mg/dL   Alkaline Phosphatase 66 44 - 121 IU/L   AST 23 0 - 40 IU/L   ALT 25 0 - 32 IU/L  CBC with Differential/Platelet  Result Value Ref Range   WBC 3.6 3.4 - 10.8 x10E3/uL   RBC 4.54 3.77 - 5.28 x10E6/uL   Hemoglobin 13.1 11.1 - 15.9 g/dL   Hematocrit 32.6 52.2 - 46.6 %   MCV 91 79 - 97 fL   MCH 28.9 26.6 - 33.0 pg   MCHC 31.8 31.5 - 35.7 g/dL   RDW 23.9 51.3 - 62.4 %   Platelets 88 (LL) 150 - 450 x10E3/uL   Neutrophils 51 Not Estab. %   Lymphs 36 Not Estab. %   Monocytes 9 Not Estab. %   Eos 3 Not Estab. %   Basos 1 Not Estab. %   Neutrophils Absolute 1.9 1.4 - 7.0 x10E3/uL   Lymphocytes Absolute 1.3 0.7 - 3.1 x10E3/uL   Monocytes Absolute 0.3 0.1 - 0.9 x10E3/uL   EOS (ABSOLUTE) 0.1 0.0 - 0.4 x10E3/uL   Basophils Absolute 0.0 0.0 - 0.2 x10E3/uL   Immature Granulocytes 0 Not Estab. %   Immature Grans (Abs) 0.0 0.0 - 0.1 x10E3/uL   Hematology Comments: Note:   Lipid Panel w/o Chol/HDL Ratio  Result Value Ref Range   Cholesterol, Total 129 100 - 199 mg/dL   Triglycerides 733 0 - 149 mg/dL   HDL 52 >30 mg/dL   VLDL Cholesterol Cal 24 5 - 40 mg/dL   LDL Chol Calc (NIH) 53 0 - 99 mg/dL  Bayer DCA Hb Q4H Waived  Result Value Ref Range   HB A1C (BAYER DCA - WAIVED) 6.5 (H) 4.8 - 5.6 %  TSH  Result Value Ref Range   TSH 6.080 (H) 0.450 - 4.500 uIU/mL  PTH, Intact and Calcium  Result Value Ref Range   PTH 24 15 - 65 pg/mL   PTH Interp Comment   VITAMIN D 25 Hydroxy (Vit-D Deficiency, Fractures)  Result Value Ref Range   Vit D, 25-Hydroxy 43.4 30.0 - 100.0 ng/mL  Assessment & Plan:   Problem List Items Addressed This Visit       Nervous and Auditory   Neuropathy - Primary    Doing better. Will take medicine PRN. Call with any concerns. Continue to monitor.         Follow up plan: Return in about 3 months (around 08/10/2022) for 2 weeks  lab only visit.

## 2022-05-10 NOTE — Assessment & Plan Note (Signed)
Doing better. Will take medicine PRN. Call with any concerns. Continue to monitor.

## 2022-05-24 ENCOUNTER — Other Ambulatory Visit: Payer: PPO

## 2022-05-24 DIAGNOSIS — E039 Hypothyroidism, unspecified: Secondary | ICD-10-CM

## 2022-05-25 LAB — TSH: TSH: 1.6 u[IU]/mL (ref 0.450–4.500)

## 2022-05-27 ENCOUNTER — Other Ambulatory Visit: Payer: Self-pay | Admitting: Family Medicine

## 2022-05-27 DIAGNOSIS — E039 Hypothyroidism, unspecified: Secondary | ICD-10-CM

## 2022-05-27 MED ORDER — LEVOTHYROXINE SODIUM 175 MCG PO TABS: 175.0000 ug | ORAL_TABLET | Freq: Every day | ORAL | 3 refills | Status: DC

## 2022-06-26 ENCOUNTER — Telehealth: Payer: Self-pay

## 2022-06-26 NOTE — Progress Notes (Signed)
Chronic Care Management Pharmacy Assistant   Name: Julia Nielsen  MRN: [id number] DOB: 05-15-1941   Reason for Encounter: Disease State   Conditions to be addressed/monitored: DMII   Recent office visits:  05/10/22 Park Liter P, DO (Neuropathy) Orders placed: none; Medication changes: Gabapentin 100 mg at bedtime prn  04/10/22 Valerie Roys, DO (CAD) Orders placed: Labs; Medication changes: Gabapentin 100 mg at bedtime  Recent consult visits:  None since last coordination call 11/19/21  Hospital visits:  None in previous 6 months  Medications: Outpatient Encounter Medications as of 06/26/2022  Medication Sig   albuterol (VENTOLIN HFA) 108 (90 Base) MCG/ACT inhaler Inhale 2 puffs into the lungs every 6 (six) hours as needed for wheezing or shortness of breath.   amLODipine (NORVASC) 10 MG tablet Take 1 tablet (10 mg total) by mouth every morning.   benazepril (LOTENSIN) 40 MG tablet Take 1 tablet (40 mg total) by mouth daily.   Biotin w/ Vitamins C & E (HAIR/SKIN/NAILS PO) Take 1 tablet by mouth daily.   Blood Glucose Monitoring Suppl (ONE TOUCH ULTRA 2) w/Device KIT 1 each by Does not apply route daily.   brimonidine (ALPHAGAN) 0.2 % ophthalmic solution 1 drop 2 (two) times daily.   carvedilol (COREG) 25 MG tablet Take 1 tablet (25 mg total) by mouth 2 (two) times daily with a meal.   cloNIDine (CATAPRES) 0.1 MG tablet TAKE ONE TABLET BY MOUTH EVERY MORNING and TAKE ONE TABLET BY MOUTH EVERY EVENING   dorzolamide-timolol (COSOPT) 22.3-6.8 MG/ML ophthalmic solution Place 1 drop into both eyes 2 (two) times daily.    Dulaglutide (TRULICITY) 1.5 WE/9.9BZ SOPN Inject 1.5 mg into the skin once a week.   fexofenadine (ALLEGRA) 180 MG tablet Take 180 mg by mouth daily.   fluticasone (FLONASE) 50 MCG/ACT nasal spray Place 2 sprays into both nostrils daily.   furosemide (LASIX) 20 MG tablet Take 20 mg by mouth daily.   gabapentin (NEURONTIN) 100 MG capsule Take 1 capsule (100 mg  total) by mouth at bedtime as needed.   glucose blood (ONETOUCH ULTRA) test strip 1 each by Other route daily. Use as instructed   latanoprost (XALATAN) 0.005 % ophthalmic solution Place 1 drop into both eyes at bedtime.    levothyroxine (SYNTHROID) 175 MCG tablet Take 1 tablet (175 mcg total) by mouth daily before breakfast.   lovastatin (MEVACOR) 40 MG tablet Take 1 tablet (40 mg total) by mouth at bedtime.   OneTouch Delica Lancets 16R MISC USE AS DIRECTED ONCE DAILY WITH TEST STRIPS E11.22   No facility-administered encounter medications on file as of 06/26/2022.   Recent Relevant Labs: Lab Results  Component Value Date/Time   HGBA1C 6.5 (H) 04/10/2022 08:39 AM   HGBA1C 6.0 (H) 10/10/2021 08:33 AM   MICROALBUR 30 (H) 10/10/2021 08:33 AM   MICROALBUR 80 (H) 10/04/2020 08:17 AM    Kidney Function Lab Results  Component Value Date/Time   CREATININE 1.28 (H) 04/10/2022 08:41 AM   CREATININE 1.27 (H) 10/10/2021 08:48 AM   CREATININE 1.32 (H) 06/02/2014 04:28 AM   CREATININE 1.38 (H) 06/01/2014 05:05 AM   GFRNONAA 31 (L) 05/18/2021 11:14 AM   GFRNONAA 39 (L) 06/02/2014 04:28 AM   GFRAA 46 (L) 10/04/2020 08:20 AM   GFRAA 46 (L) 06/02/2014 04:28 AM    Current antihyperglycemic regimen:  Dulaglutide (TRULICITY) 1.5 CV/8.9FY SOPN  What recent interventions/DTPs have been made to improve glycemic control:  Doing well with a1c of 6.5. Continue current  regimen. Continue to monitor, per Park Liter 04/10/22  Have there been any recent hospitalizations or ED visits since last visit with CPP? No  Patient denies hypoglycemic symptoms, including None  Patient denies hyperglycemic symptoms, including none  How often are you checking your blood sugar? once daily  What are your blood sugars ranging? 75-180 Fasting: 188 this morning  During the week, how often does your blood glucose drop below 70?  Patient states that blood sugars have been in the 70s and 80s lately. She states not for  sure what she is doing wrong for them to be low but not having any symptoms  Are you checking your feet daily/regularly? Patient states that she does have some swelling in feet but props them up on pillow and the swelling goes down  Adherence Review: Is the patient currently on a STATIN medication? Yes,Lovastatin 40 mg Is the patient currently on ACE/ARB medication? Yes, Benazepril 40 mg Does the patient have >5 day gap between last estimated fill dates? Yes   Care Gaps: Colonoscopy-No longer required.  Diabetic Foot Exam-04/10/22 Mammogram-09/03/21 Ophthalmology-07/09/21 Dexa Scan - NA Annual Well Visit - 05/06/22 (Medicare) Micro albumin-04/10/22 (GFR/Urine ACR) Hemoglobin A1c- 04/10/22  Star Rating Drugs: Benazepril 40 mg-last fill 05/21/22 90 ds, 1/61/09 90 ds Trulicity 1.5 UE/[id number] fill 11/22/21 28 ds(Patient gets from the manufacturer, has plenty on hand til Jan) Lovastatin 40 mg-last fill 05/21/22 90 ds, 02/21/22 90 ds   [place] [phone]

## 2022-07-04 NOTE — Telephone Encounter (Signed)
Not on a medicine that causes low sugars. Trulicity only works when you eat- it does not cause hypoglycemia. Those are normal blood sugars- she's just not used to being in the normal range. If she's getting shakey she should eat something. No medication changes needed.

## 2022-07-08 DIAGNOSIS — H401132 Primary open-angle glaucoma, bilateral, moderate stage: Secondary | ICD-10-CM | POA: Diagnosis not present

## 2022-07-08 LAB — HM DIABETES EYE EXAM

## 2022-07-08 NOTE — Telephone Encounter (Signed)
Attempted to contact patient, NA LVM for patient to call back in regards to providers advise.

## 2022-07-30 ENCOUNTER — Telehealth: Payer: Self-pay

## 2022-07-30 NOTE — Progress Notes (Signed)
Chronic Care Management Pharmacy Assistant   Name: Julia Nielsen  MRN: [id number] DOB: [birth year]   Reason for Encounter: Disease State   Conditions to be addressed/monitored: DMII   Recent office visits:  None since last coordination call  Recent consult visits:  None since last coordination call  Hospital visits:  None in previous 6 months  Medications: Outpatient Encounter Medications as of 07/30/2022  Medication Sig   albuterol (VENTOLIN HFA) 108 (90 Base) MCG/ACT inhaler Inhale 2 puffs into the lungs every 6 (six) hours as needed for wheezing or shortness of breath.   amLODipine (NORVASC) 10 MG tablet Take 1 tablet (10 mg total) by mouth every morning.   benazepril (LOTENSIN) 40 MG tablet Take 1 tablet (40 mg total) by mouth daily.   Biotin w/ Vitamins C & E (HAIR/SKIN/NAILS PO) Take 1 tablet by mouth daily.   Blood Glucose Monitoring Suppl (ONE TOUCH ULTRA 2) w/Device KIT 1 each by Does not apply route daily.   brimonidine (ALPHAGAN) 0.2 % ophthalmic solution 1 drop 2 (two) times daily.   carvedilol (COREG) 25 MG tablet Take 1 tablet (25 mg total) by mouth 2 (two) times daily with a meal.   cloNIDine (CATAPRES) 0.1 MG tablet TAKE ONE TABLET BY MOUTH EVERY MORNING and TAKE ONE TABLET BY MOUTH EVERY EVENING   dorzolamide-timolol (COSOPT) 22.3-6.8 MG/ML ophthalmic solution Place 1 drop into both eyes 2 (two) times daily.    Dulaglutide (TRULICITY) 1.5 PX/1.0GY SOPN Inject 1.5 mg into the skin once a week.   fexofenadine (ALLEGRA) 180 MG tablet Take 180 mg by mouth daily.   fluticasone (FLONASE) 50 MCG/ACT nasal spray Place 2 sprays into both nostrils daily.   furosemide (LASIX) 20 MG tablet Take 20 mg by mouth daily.   gabapentin (NEURONTIN) 100 MG capsule Take 1 capsule (100 mg total) by mouth at bedtime as needed.   glucose blood (ONETOUCH ULTRA) test strip 1 each by Other route daily. Use as instructed   latanoprost (XALATAN) 0.005 % ophthalmic solution Place 1 drop  into both eyes at bedtime.    levothyroxine (SYNTHROID) 175 MCG tablet Take 1 tablet (175 mcg total) by mouth daily before breakfast.   lovastatin (MEVACOR) 40 MG tablet Take 1 tablet (40 mg total) by mouth at bedtime.   OneTouch Delica Lancets 69S MISC USE AS DIRECTED ONCE DAILY WITH TEST STRIPS E11.22   No facility-administered encounter medications on file as of 07/30/2022.   Recent Relevant Labs: Lab Results  Component Value Date/Time   HGBA1C 6.5 (H) 04/10/2022 08:39 AM   HGBA1C 6.0 (H) 10/10/2021 08:33 AM   MICROALBUR 30 (H) 10/10/2021 08:33 AM   MICROALBUR 80 (H) 10/04/2020 08:17 AM    Kidney Function Lab Results  Component Value Date/Time   CREATININE 1.28 (H) 04/10/2022 08:41 AM   CREATININE 1.27 (H) 10/10/2021 08:48 AM   CREATININE 1.32 (H) 06/02/2014 04:28 AM   CREATININE 1.38 (H) 06/01/2014 05:05 AM   GFRNONAA 31 (L) 05/18/2021 11:14 AM   GFRNONAA 39 (L) 06/02/2014 04:28 AM   GFRAA 46 (L) 10/04/2020 08:20 AM   GFRAA 46 (L) 06/02/2014 04:28 AM    Current antihyperglycemic regimen:  Dulaglutide (TRULICITY) 1.5 WN/4.6EV SOPN   Adherence Review: Is the patient currently on a STATIN medication? Yes Is the patient currently on ACE/ARB medication? Yes Does the patient have >5 day gap between last estimated fill dates? No   Care Gaps: Colonoscopy-No longer required.  Diabetic Foot Exam-04/10/22 Mammogram-09/03/21 Ophthalmology-07/09/21 Dexa Scan -  NA Annual Well Visit - 05/06/22 (Medicare) Micro albumin-04/10/22 (GFR/Urine ACR) Hemoglobin A1c- 04/10/22  Star Rating Drugs: Benazepril 40 mg-last fill 05/21/22 90 ds, 6/58/26 90 ds Trulicity 1.5 [id number] fill 11/22/21 28 ds(Patient gets from the manufacturer, has plenty on hand til Jan) Lovastatin 40 mg-last fill 05/21/22 90 ds, 02/21/22 90 ds   [place] [phone]

## 2022-08-07 DIAGNOSIS — N2889 Other specified disorders of kidney and ureter: Secondary | ICD-10-CM | POA: Diagnosis not present

## 2022-08-07 DIAGNOSIS — I129 Hypertensive chronic kidney disease with stage 1 through stage 4 chronic kidney disease, or unspecified chronic kidney disease: Secondary | ICD-10-CM | POA: Diagnosis not present

## 2022-08-07 DIAGNOSIS — N281 Cyst of kidney, acquired: Secondary | ICD-10-CM | POA: Diagnosis not present

## 2022-08-07 DIAGNOSIS — E1122 Type 2 diabetes mellitus with diabetic chronic kidney disease: Secondary | ICD-10-CM | POA: Diagnosis not present

## 2022-08-07 DIAGNOSIS — R809 Proteinuria, unspecified: Secondary | ICD-10-CM | POA: Diagnosis not present

## 2022-08-07 DIAGNOSIS — N1832 Chronic kidney disease, stage 3b: Secondary | ICD-10-CM | POA: Diagnosis not present

## 2022-08-07 DIAGNOSIS — I10 Essential (primary) hypertension: Secondary | ICD-10-CM | POA: Diagnosis not present

## 2022-08-07 DIAGNOSIS — E876 Hypokalemia: Secondary | ICD-10-CM | POA: Diagnosis not present

## 2022-08-07 DIAGNOSIS — R600 Localized edema: Secondary | ICD-10-CM | POA: Diagnosis not present

## 2022-08-12 DIAGNOSIS — N281 Cyst of kidney, acquired: Secondary | ICD-10-CM | POA: Diagnosis not present

## 2022-08-12 DIAGNOSIS — E1122 Type 2 diabetes mellitus with diabetic chronic kidney disease: Secondary | ICD-10-CM | POA: Diagnosis not present

## 2022-08-12 DIAGNOSIS — I10 Essential (primary) hypertension: Secondary | ICD-10-CM | POA: Diagnosis not present

## 2022-08-12 DIAGNOSIS — R808 Other proteinuria: Secondary | ICD-10-CM | POA: Diagnosis not present

## 2022-08-12 DIAGNOSIS — R809 Proteinuria, unspecified: Secondary | ICD-10-CM | POA: Diagnosis not present

## 2022-08-12 DIAGNOSIS — N1832 Chronic kidney disease, stage 3b: Secondary | ICD-10-CM | POA: Diagnosis not present

## 2022-08-13 ENCOUNTER — Ambulatory Visit (INDEPENDENT_AMBULATORY_CARE_PROVIDER_SITE_OTHER): Payer: PPO | Admitting: Family Medicine

## 2022-08-13 ENCOUNTER — Encounter: Payer: Self-pay | Admitting: Family Medicine

## 2022-08-13 VITALS — BP 112/71 | HR 77 | Temp 97.8°F | Wt 171.0 lb

## 2022-08-13 DIAGNOSIS — I2510 Atherosclerotic heart disease of native coronary artery without angina pectoris: Secondary | ICD-10-CM

## 2022-08-13 DIAGNOSIS — N183 Chronic kidney disease, stage 3 unspecified: Secondary | ICD-10-CM

## 2022-08-13 DIAGNOSIS — G629 Polyneuropathy, unspecified: Secondary | ICD-10-CM

## 2022-08-13 DIAGNOSIS — E1122 Type 2 diabetes mellitus with diabetic chronic kidney disease: Secondary | ICD-10-CM | POA: Diagnosis not present

## 2022-08-13 LAB — BAYER DCA HB A1C WAIVED: HB A1C (BAYER DCA - WAIVED): 6.3 % — ABNORMAL HIGH (ref 4.8–5.6)

## 2022-08-13 MED ORDER — ONETOUCH ULTRA 2 W/DEVICE KIT: 1.0000 | PACK | Freq: Every day | 0 refills | Status: AC

## 2022-08-13 MED ORDER — GABAPENTIN 100 MG PO CAPS: 100.0000 mg | ORAL_CAPSULE | Freq: Every evening | ORAL | 1 refills | Status: DC | PRN

## 2022-08-13 MED ORDER — BENAZEPRIL HCL 40 MG PO TABS: 40.0000 mg | ORAL_TABLET | Freq: Every day | ORAL | 0 refills | Status: DC

## 2022-08-13 MED ORDER — LOVASTATIN 40 MG PO TABS: 40.0000 mg | ORAL_TABLET | Freq: Every day | ORAL | 0 refills | Status: DC

## 2022-08-13 MED ORDER — CLONIDINE HCL 0.1 MG PO TABS: ORAL_TABLET | ORAL | 0 refills | Status: DC

## 2022-08-13 MED ORDER — CARVEDILOL 25 MG PO TABS: 25.0000 mg | ORAL_TABLET | Freq: Two times a day (BID) | ORAL | 0 refills | Status: DC

## 2022-08-13 MED ORDER — AMLODIPINE BESYLATE 10 MG PO TABS: 10.0000 mg | ORAL_TABLET | Freq: Every morning | ORAL | 0 refills | Status: DC

## 2022-08-13 NOTE — Assessment & Plan Note (Signed)
Doing well with A1c of 6.3. Continue current regimen. Call with any concerns. Recheck 3 months.

## 2022-08-13 NOTE — Progress Notes (Signed)
BP 112/71   Pulse 77   Temp 97.8 F (36.6 C)   Wt 171 lb (77.6 kg)   SpO2 98%   BMI 32.31 kg/m    Subjective:    Patient ID: Julia Nielsen, female    DOB: 1941/10/05, 81 y.o.   MRN: [id number]  HPI: Julia Nielsen is a 81 y.o. female  Chief Complaint  Patient presents with   Peripheral Neuropathy   Diabetes    Per patient her meter broke yesterday to check her blood sugar, would like a new one today    Neuropathy is doing better. Taking the gabapentin as needed. Feeling well.   DIABETES Hypoglycemic episodes:no Polydipsia/polyuria: no Visual disturbance: no Chest pain: no Paresthesias: no Glucose Monitoring: no- machine broke, needs a new one  Accucheck frequency: not checking Taking Insulin?: no Blood Pressure Monitoring: a few times a week Retinal Examination: Up to Date Foot Exam: Up to Date Diabetic Education: Completed Pneumovax: Up to Date Influenza: Up to Date Aspirin: no  Relevant past medical, surgical, family and social history reviewed and updated as indicated. Interim medical history since our last visit reviewed. Allergies and medications reviewed and updated.  Review of Systems  Constitutional: Negative.   Respiratory: Negative.    Cardiovascular: Negative.   Musculoskeletal: Negative.   Psychiatric/Behavioral: Negative.      Per HPI unless specifically indicated above     Objective:    BP 112/71   Pulse 77   Temp 97.8 F (36.6 C)   Wt 171 lb (77.6 kg)   SpO2 98%   BMI 32.31 kg/m   Wt Readings from Last 3 Encounters:  08/13/22 171 lb (77.6 kg)  05/10/22 169 lb 8 oz (76.9 kg)  04/10/22 171 lb 6.4 oz (77.7 kg)    Physical Exam Vitals and nursing note reviewed.  Constitutional:      General: She is not in acute distress.    Appearance: Normal appearance. She is not ill-appearing, toxic-appearing or diaphoretic.  HENT:     Head: Normocephalic and atraumatic.     Right Ear: External ear normal.     Left Ear: External ear  normal.     Nose: Nose normal.     Mouth/Throat:     Mouth: Mucous membranes are moist.     Pharynx: Oropharynx is clear.  Eyes:     General: No scleral icterus.       Right eye: No discharge.        Left eye: No discharge.     Extraocular Movements: Extraocular movements intact.     Conjunctiva/sclera: Conjunctivae normal.     Pupils: Pupils are equal, round, and reactive to light.  Cardiovascular:     Rate and Rhythm: Normal rate and regular rhythm.     Pulses: Normal pulses.     Heart sounds: Normal heart sounds. No murmur heard.    No friction rub. No gallop.  Pulmonary:     Effort: Pulmonary effort is normal. No respiratory distress.     Breath sounds: Normal breath sounds. No stridor. No wheezing, rhonchi or rales.  Chest:     Chest wall: No tenderness.  Musculoskeletal:        General: Normal range of motion.     Cervical back: Normal range of motion and neck supple.  Skin:    General: Skin is warm and dry.     Capillary Refill: Capillary refill takes less than 2 seconds.     Coloration: Skin is not  jaundiced or pale.     Findings: No bruising, erythema, lesion or rash.  Neurological:     General: No focal deficit present.     Mental Status: She is alert and oriented to person, place, and time. Mental status is at baseline.  Psychiatric:        Mood and Affect: Mood normal.        Behavior: Behavior normal.        Thought Content: Thought content normal.        Judgment: Judgment normal.     Results for orders placed or performed in visit on 07/09/22  HM DIABETES EYE EXAM  Result Value Ref Range   HM Diabetic Eye Exam No Retinopathy No Retinopathy      Assessment & Plan:   Problem List Items Addressed This Visit       Cardiovascular and Mediastinum   CAD (coronary artery disease)   Relevant Medications   lovastatin (MEVACOR) 40 MG tablet   cloNIDine (CATAPRES) 0.1 MG tablet   carvedilol (COREG) 25 MG tablet   benazepril (LOTENSIN) 40 MG tablet    amLODipine (NORVASC) 10 MG tablet     Endocrine   Type 2 diabetes mellitus with stage 3 chronic kidney disease ([place]) - Primary    Doing well with A1c of 6.3. Continue current regimen. Call with any concerns. Recheck 3 months.       Relevant Medications   lovastatin (MEVACOR) 40 MG tablet   benazepril (LOTENSIN) 40 MG tablet   Other Relevant Orders   Bayer DCA Hb A1c Waived     Nervous and Auditory   Neuropathy    Tolerating her medicine well. No concerns. Continue to monitor.         Follow up plan: Return in about 3 months (around 11/13/2022) for physical.

## 2022-08-13 NOTE — Assessment & Plan Note (Signed)
Tolerating her medicine well. No concerns. Continue to monitor.

## 2022-08-14 ENCOUNTER — Telehealth: Payer: Self-pay | Admitting: Family Medicine

## 2022-08-14 DIAGNOSIS — Z1231 Encounter for screening mammogram for malignant neoplasm of breast: Secondary | ICD-10-CM

## 2022-08-14 NOTE — Telephone Encounter (Signed)
Order placed, will notify patient.

## 2022-08-14 NOTE — Telephone Encounter (Signed)
Copied from Start [id number]. Topic: General - Other >> Aug 14, 2022 10:10 AM [place] B wrote: Reason for CRM: Patient called in about form she needs to get signed by her and Dr Wynetta Emery of contract with trulicity. Please call back to discuss

## 2022-08-14 NOTE — Telephone Encounter (Signed)
Patient states she will be needing patient assistance form filled out soon, patient received Trulicity through [facility] care. Advised patient when she receives form to drop it off and provider will sign, patient verbally understood.

## 2022-08-14 NOTE — Telephone Encounter (Signed)
Referral Request - Has patient seen PCP for this complaint? No  *If NO, is insurance requiring patient see PCP for this issue before PCP can refer them? Referral for which specialty: mammogram Preferred provider/office: ? Reason for referral: annual mammo

## 2022-08-28 ENCOUNTER — Telehealth: Payer: Self-pay

## 2022-08-28 NOTE — Progress Notes (Signed)
Chronic Care Management Pharmacy Assistant   Name: Julia Nielsen  MRN: [id number] DOB: 12-Sep-1941    Reason for Encounter: Disease State   Conditions to be addressed/monitored: DMII  Recent office visits:  08/13/22 Valerie Roys, DO (Diabetes) Orders placed: Labs; Medication changes: none  Recent consult visits:  None since last coordination call 07/30/22  Hospital visits:  None in previous 6 months  Medications: Outpatient Encounter Medications as of 08/28/2022  Medication Sig   albuterol (VENTOLIN HFA) 108 (90 Base) MCG/ACT inhaler Inhale 2 puffs into the lungs every 6 (six) hours as needed for wheezing or shortness of breath.   amLODipine (NORVASC) 10 MG tablet Take 1 tablet (10 mg total) by mouth every morning.   benazepril (LOTENSIN) 40 MG tablet Take 1 tablet (40 mg total) by mouth daily.   Biotin w/ Vitamins C & E (HAIR/SKIN/NAILS PO) Take 1 tablet by mouth daily.   Blood Glucose Monitoring Suppl (ONE TOUCH ULTRA 2) w/Device KIT 1 each by Does not apply route daily.   brimonidine (ALPHAGAN) 0.2 % ophthalmic solution 1 drop 2 (two) times daily.   carvedilol (COREG) 25 MG tablet Take 1 tablet (25 mg total) by mouth 2 (two) times daily with a meal.   cloNIDine (CATAPRES) 0.1 MG tablet TAKE ONE TABLET BY MOUTH EVERY MORNING and TAKE ONE TABLET BY MOUTH EVERY EVENING   dorzolamide-timolol (COSOPT) 22.3-6.8 MG/ML ophthalmic solution Place 1 drop into both eyes 2 (two) times daily.    Dulaglutide (TRULICITY) 1.5 MQ/2.8MN SOPN Inject 1.5 mg into the skin once a week.   fexofenadine (ALLEGRA) 180 MG tablet Take 180 mg by mouth daily.   fluticasone (FLONASE) 50 MCG/ACT nasal spray Place 2 sprays into both nostrils daily.   furosemide (LASIX) 20 MG tablet Take 20 mg by mouth daily.   gabapentin (NEURONTIN) 100 MG capsule Take 1 capsule (100 mg total) by mouth at bedtime as needed.   glucose blood (ONETOUCH ULTRA) test strip 1 each by Other route daily. Use as instructed    latanoprost (XALATAN) 0.005 % ophthalmic solution Place 1 drop into both eyes at bedtime.    levothyroxine (SYNTHROID) 175 MCG tablet Take 1 tablet (175 mcg total) by mouth daily before breakfast.   lovastatin (MEVACOR) 40 MG tablet Take 1 tablet (40 mg total) by mouth at bedtime.   OneTouch Delica Lancets 81R MISC USE AS DIRECTED ONCE DAILY WITH TEST STRIPS E11.22   No facility-administered encounter medications on file as of 08/28/2022.   Recent Relevant Labs: Lab Results  Component Value Date/Time   HGBA1C 6.3 (H) 08/13/2022 09:08 AM   HGBA1C 6.5 (H) 04/10/2022 08:39 AM   MICROALBUR 30 (H) 10/10/2021 08:33 AM   MICROALBUR 80 (H) 10/04/2020 08:17 AM    Kidney Function Lab Results  Component Value Date/Time   CREATININE 1.28 (H) 04/10/2022 08:41 AM   CREATININE 1.27 (H) 10/10/2021 08:48 AM   CREATININE 1.32 (H) 06/02/2014 04:28 AM   CREATININE 1.38 (H) 06/01/2014 05:05 AM   GFRNONAA 31 (L) 05/18/2021 11:14 AM   GFRNONAA 39 (L) 06/02/2014 04:28 AM   GFRAA 46 (L) 10/04/2020 08:20 AM   GFRAA 46 (L) 06/02/2014 04:28 AM    Current antihyperglycemic regimen:  Dulaglutide (TRULICITY) 1.5 RN/1.6FB SOPN   What recent interventions/DTPs have been made to improve glycemic control:  None noted  Have there been any recent hospitalizations or ED visits since last visit with CPP? No  Patient denies hypoglycemic symptoms, including None  Patient denies hyperglycemic  symptoms, including none  How often are you checking your blood sugar? once daily  What are your blood sugars ranging? 100-200 Fasting: 166 this morning  During the week, how often does your blood glucose drop below 70? none  Are you checking your feet daily/regularly? Swelling in feet, appt with kidney doctor next month. Swelling does go down when she elevates her feet.  Adherence Review: Is the patient currently on a STATIN medication? Yes Is the patient currently on ACE/ARB medication? Yes Does the patient have >5  day gap between last estimated fill dates? No  Care Gaps: Colonoscopy-No longer required.  Diabetic Foot Exam-04/10/22 Mammogram-09/03/21 Ophthalmology-07/08/22 Dexa Scan - NA Annual Well Visit - 05/06/22 (Medicare) Micro albumin-04/10/22 (GFR/Urine ACR) Hemoglobin A1c- 08/13/22 (6.3), 04/10/22 (6.5)   Star Rating Drugs: Benazepril 40 mg-last fill 08/12/22 90 ds, 04/15/21 90 ds Trulicity 1.5 VO/7.2SP-ZZCK fill 11/22/21 28 ds(Patient gets from the manufacturer, has plenty on hand til Jan) Lovastatin 40 mg-last fill 08/12/22 90 ds, 05/21/22 90 ds   [place] [phone]

## 2022-09-02 ENCOUNTER — Other Ambulatory Visit: Payer: Self-pay | Admitting: Family Medicine

## 2022-09-02 NOTE — Telephone Encounter (Signed)
Requested medications are due for refill today.  Unsure  Requested medications are on the active medications list.  yes  Last refill. 04/10/2022 63m 1 rf  Future visit scheduled.   Yes   Notes to clinic.  Med listed as historical.    Requested Prescriptions  Pending Prescriptions Disp Refills   TRULICITY 1.5 MJH/4.1DESOPN [Pharmacy Med Name: Trulicity Subcutaneous Solution Pen-injector 1.5 MG/0.5ML]  0    Sig: INJECT 1.5 MG (0.5ML) UNDER THE SKIN ONCE A WEEK     Endocrinology:  Diabetes - GLP-1 Receptor Agonists Passed - 09/02/2022  6:20 AM      Passed - HBA1C is between 0 and 7.9 and within 180 days    HB A1C (BAYER DCA - WAIVED)  Date Value Ref Range Status  08/13/2022 6.3 (H) 4.8 - 5.6 % Final    Comment:             Prediabetes: 5.7 - 6.4          Diabetes: >6.4          Glycemic control for adults with diabetes: <7.0          Passed - Valid encounter within last 6 months    Recent Outpatient Visits           2 weeks ago Type 2 diabetes mellitus with stage 3 chronic kidney disease, without long-term current use of insulin, unspecified whether stage 3a or 3b CKD ([place]   [place] P, DO   3 months ago Neuropathy   [facility]   4 months ago Coronary artery disease involving native coronary artery of native heart without angina pectoris   [place] DO   10 months ago Routine general medical examination at a health care facility   [place]   1 year ago COVID-19   [facility]       Future Appointments             In 2 months Johnson, MBarb Merino DO [facility] [place]

## 2022-09-16 ENCOUNTER — Ambulatory Visit
Admission: RE | Admit: 2022-09-16 | Discharge: 2022-09-16 | Disposition: A | Discharge status: Home / Self Care | Payer: PPO | Source: Ambulatory Visit | Attending: Family Medicine | Admitting: Family Medicine

## 2022-09-16 DIAGNOSIS — Z1231 Encounter for screening mammogram for malignant neoplasm of breast: Secondary | ICD-10-CM | POA: Insufficient documentation

## 2022-09-17 ENCOUNTER — Encounter: Payer: Self-pay | Admitting: Family Medicine

## 2022-10-02 ENCOUNTER — Telehealth: Payer: Self-pay | Admitting: Family Medicine

## 2022-10-02 NOTE — Telephone Encounter (Signed)
Copied from [place] [id number]. Topic: General - Other >> Oct 02, 2022  2:29 PM Julia Nielsen wrote: Reason for CRM: The patient would like to speak with a member of staff regarding coverage of their Dulaglutide (TRULICITY) 1.5 PR/9.4VO SOPN [[id number]]   The patient would like to discuss coverage of their medication with [facility]   Please contact when available

## 2022-10-03 NOTE — Telephone Encounter (Signed)
Pt called to advise that her app for trulicity was approved in November / she received  call to call office back about this / please advise

## 2022-10-03 NOTE — Telephone Encounter (Signed)
Spoke with patient and she says she is due for a renewal for her [employer] at the end of this month. Patient says she has not heard anything and was wondering if our office had received anything on her behalf. I informed patient we have not and provided her with [facility] contact information to call and follow up. Advised patient to give our office a call back with any concerns or questions.

## 2022-10-15 ENCOUNTER — Other Ambulatory Visit: Payer: Self-pay | Admitting: Family Medicine

## 2022-10-15 DIAGNOSIS — J309 Allergic rhinitis, unspecified: Secondary | ICD-10-CM

## 2022-10-16 NOTE — Telephone Encounter (Signed)
Requested Prescriptions  Pending Prescriptions Disp Refills   fluticasone (FLONASE) 50 MCG/ACT nasal spray [Pharmacy Med Name: Fluticasone Propionate 50 MCG/ACT Nasal Suspension] 48 g 0    Sig: Use 2 spray(s) in each nostril once daily     Ear, Nose, and Throat: Nasal Preparations - Corticosteroids Passed - 10/15/2022  9:22 AM      Passed - Valid encounter within last 12 months    Recent Outpatient Visits           2 months ago Type 2 diabetes mellitus with stage 3 chronic kidney disease, without long-term current use of insulin, unspecified whether stage 3a or 3b CKD ([place])   [place], Megan P, DO   5 months ago Neuropathy   [place], Megan P, DO   6 months ago Coronary artery disease involving native coronary artery of native heart without angina pectoris   [place], Megan P, DO   1 year ago Routine general medical examination at a health care facility   [place], [place], DO   1 year ago COVID-19   [facility], Scheryl Darter, NP       Future Appointments             In 4 weeks Wynetta Emery, Barb Merino, DO [place], P[facility]

## 2022-10-25 ENCOUNTER — Other Ambulatory Visit: Payer: Self-pay | Admitting: Family Medicine

## 2022-10-25 DIAGNOSIS — N183 Chronic kidney disease, stage 3 unspecified: Secondary | ICD-10-CM

## 2022-10-25 NOTE — Telephone Encounter (Signed)
Requested Prescriptions  Pending Prescriptions Disp Refills   ONETOUCH ULTRA test strip [Pharmacy Med Name: OneTouch Ultra Blue In Vitro Strip] 100 each 0    Sig: USE TO CHECK BLOOD SUGAR AS NEEDED AS INSTRUCTED     Endocrinology: Diabetes - Testing Supplies Passed - 10/25/2022 12:05 PM      Passed - Valid encounter within last 12 months    Recent Outpatient Visits           2 months ago Type 2 diabetes mellitus with stage 3 chronic kidney disease, without long-term current use of insulin, unspecified whether stage 3a or 3b CKD ([place])   [place], Megan P, DO   5 months ago Neuropathy   [place], Megan P, DO   6 months ago Coronary artery disease involving native coronary artery of native heart without angina pectoris   [place], Megan P, DO   1 year ago Routine general medical examination at a health care facility   [place], [place], DO   1 year ago COVID-19   [facility], Scheryl Darter, NP       Future Appointments             In 2 weeks Wynetta Emery, Barb Merino, DO [place], [facility]

## 2022-11-11 ENCOUNTER — Ambulatory Visit: Payer: PPO

## 2022-11-11 ENCOUNTER — Telehealth: Payer: Self-pay | Admitting: Family Medicine

## 2022-11-11 NOTE — Progress Notes (Signed)
Care Management & Coordination Services Pharmacy Note  11/11/2022 Name:  Julia Nielsen MRN:  [id number] DOB:  10-20-1940  Summary: -Pleasant 82 year old female presents for visit. From [place] but married a man from [place]. He worked on a dairy farm so they moved to [place]. Raised 2 girls (One in [facility] and the other passed of Bone Cancer in 2022 around October) -Loves to read (Likes mysteries), heather graham. Told her about "Bitter Blood"  Recommendations/Changes made from today's visit: -Patient hasn't gotten Trulicity in the mail. Counseled to call the company to get refill. Counseled this many times that I can't control the refill process    Subjective: Julia Nielsen is an 82 y.o. year old female who is a primary patient of Valerie Roys, DO.  The care coordination team was consulted for assistance with disease management and care coordination needs.    Engaged with patient by telephone for follow up visit.  Recent office visits:  08/13/22 [place] Liter P, DO (Diabetes) Orders placed: Labs; Medication changes: none   Recent consult visits:  None since last coordination call 07/30/22   Hospital visits:  None in previous 6 months   Objective:  Lab Results  Component Value Date   CREATININE 1.28 (H) 04/10/2022   BUN 16 04/10/2022   EGFR 42 (L) 04/10/2022   GFRNONAA 31 (L) 05/18/2021   GFRAA 46 (L) 10/04/2020   NA 141 04/10/2022   K 4.4 04/10/2022   CALCIUM 9.5 04/10/2022   CO2 22 04/10/2022   GLUCOSE 185 (H) 04/10/2022    Lab Results  Component Value Date/Time   HGBA1C 6.3 (H) 08/13/2022 09:08 AM   HGBA1C 6.5 (H) 04/10/2022 08:39 AM   MICROALBUR 30 (H) 10/10/2021 08:33 AM   MICROALBUR 80 (H) 10/04/2020 08:17 AM    Last diabetic Eye exam:  Lab Results  Component Value Date/Time   HMDIABEYEEXA No Retinopathy 07/08/2022 12:00 AM    Last diabetic Foot exam: No results found for: "HMDIABFOOTEX"   Lab Results  Component Value Date   CHOL 129 04/10/2022   HDL 52  04/10/2022   LDLCALC 53 04/10/2022   TRIG 138 04/10/2022   CHOLHDL 3.1 05/07/2019       Latest Ref Rng & Units 04/10/2022    8:41 AM 10/10/2021    8:48 AM 05/18/2021   11:14 AM  Hepatic Function  Total Protein 6.0 - 8.5 g/dL 6.5  6.2  6.5   Albumin 3.6 - 4.6 g/dL 4.2  4.0  3.3   AST 0 - 40 IU/L 23  26  34   ALT 0 - 32 IU/L '25  22  27   '$ Alk Phosphatase 44 - 121 IU/L 66  59  53   Total Bilirubin 0.0 - 1.2 mg/dL 0.7  0.6  1.1     Lab Results  Component Value Date/Time   TSH 1.600 05/24/2022 08:41 AM   TSH 6.080 (H) 04/10/2022 08:41 AM       Latest Ref Rng & Units 04/10/2022    8:41 AM 10/10/2021    8:48 AM 05/18/2021   11:14 AM  CBC  WBC 3.4 - 10.8 x10E3/uL 3.6  4.0  6.7   Hemoglobin 11.1 - 15.9 g/dL 13.1  12.4  12.9   Hematocrit 34.0 - 46.6 % 41.2  39.4  37.9   Platelets 150 - 450 x10E3/uL 88  78  100     Lab Results  Component Value Date/Time   VD25OH 43.4 04/10/2022 08:41 AM  VD25OH 43.1 04/03/2021 09:23 AM   VITAMINB12 372 09/19/2017 09:20 AM    Clinical ASCVD: No  The ASCVD Risk score (Arnett DK, et al., 2019) failed to calculate for the following reasons:   The 2019 ASCVD risk score is only valid for ages 66 to 80    Other: (CHADS2VASc if Afib, MMRC or CAT for COPD, ACT, DEXA)     08/13/2022    8:50 AM 05/06/2022    8:41 AM 10/10/2021    8:22 AM  Depression screen PHQ 2/9  Decreased Interest 0 0 0  Down, Depressed, Hopeless 0 0 0  PHQ - 2 Score 0 0 0  Altered sleeping 0  0  Tired, decreased energy 0  0  Change in appetite 0  0  Feeling bad or failure about yourself  0  0  Trouble concentrating 0  0  Moving slowly or fidgety/restless 0  0  Suicidal thoughts 0    PHQ-9 Score 0  0  Difficult doing work/chores Not difficult at all       Social History   Tobacco Use  Smoking Status Never  Smokeless Tobacco Never   BP Readings from Last 3 Encounters:  08/13/22 112/71  05/10/22 112/70  04/10/22 (!) 156/80   Pulse Readings from Last 3 Encounters:   08/13/22 77  05/10/22 80  04/10/22 81   Wt Readings from Last 3 Encounters:  08/13/22 171 lb (77.6 kg)  05/10/22 169 lb 8 oz (76.9 kg)  04/10/22 171 lb 6.4 oz (77.7 kg)   BMI Readings from Last 3 Encounters:  08/13/22 32.31 kg/m  05/10/22 32.03 kg/m  04/10/22 32.39 kg/m    Allergies  Allergen Reactions   Prednisone     Elevated blood sugar     Medications Reviewed Today     Reviewed by Valerie Roys, DO (Physician) on 08/13/22 at [phone]  Med List Status: <None>   Medication Order Taking? Sig Documenting Provider Last Dose Status Informant  albuterol (VENTOLIN HFA) 108 (90 Base) MCG/ACT inhaler [id number] Yes Inhale 2 puffs into the lungs every 6 (six) hours as needed for wheezing or shortness of breath. Johnson, Megan P, DO Taking Active   amLODipine (NORVASC) 10 MG tablet [id number] Yes Take 1 tablet (10 mg total) by mouth every morning. Johnson, Megan P, DO Taking Active   benazepril (LOTENSIN) 40 MG tablet [id number] Yes Take 1 tablet (40 mg total) by mouth daily. [place] Liter P, DO Taking Active   Biotin w/ Vitamins C & E (HAIR/SKIN/NAILS PO) [id number] Yes Take 1 tablet by mouth daily. [provider] Taking Active   Blood Glucose Monitoring Suppl (ONE TOUCH ULTRA 2) w/Device KIT [id number]  1 each by Does not apply route daily. Johnson, Megan P, DO  Active   brimonidine (ALPHAGAN) 0.2 % ophthalmic solution [id number] Yes 1 drop 2 (two) times daily. [provider] Taking Active   carvedilol (COREG) 25 MG tablet [id number] Yes Take 1 tablet (25 mg total) by mouth 2 (two) times daily with a meal. Wynetta Emery, Megan P, DO Taking Active   cloNIDine (CATAPRES) 0.1 MG tablet [id number] Yes TAKE ONE TABLET BY MOUTH EVERY MORNING and TAKE ONE TABLET BY MOUTH EVERY EVENING Johnson, Megan P, DO Taking Active   dorzolamide-timolol (COSOPT) 22.3-6.8 MG/ML ophthalmic solution [id number] Yes Place 1 drop into both eyes 2 (two) times daily.  [provider] Taking  Active   Dulaglutide (TRULICITY) 1.5 ZG/0.1VC SOPN [id number] Yes Inject 1.5 mg into the skin once a week.  Johnson, Megan P, DO Taking Active   fexofenadine (ALLEGRA) 180 MG tablet [id number] Yes Take 180 mg by mouth daily. [provider] Taking Active   fluticasone (FLONASE) 50 MCG/ACT nasal spray [id number] Yes Place 2 sprays into both nostrils daily. Johnson, Megan P, DO Taking Active   furosemide (LASIX) 20 MG tablet [id number] Yes Take 20 mg by mouth daily. [provider] Taking Active   gabapentin (NEURONTIN) 100 MG capsule [id number] Yes Take 1 capsule (100 mg total) by mouth at bedtime as needed. Johnson, Megan P, DO Taking Active   glucose blood (ONETOUCH ULTRA) test strip [id number] Yes 1 each by Other route daily. Use as instructed [place] Liter P, DO Taking Active   latanoprost (XALATAN) 0.005 % ophthalmic solution [id number] Yes Place 1 drop into both eyes at bedtime.  [provider] Taking Active   levothyroxine (SYNTHROID) 175 MCG tablet [id number] Yes Take 1 tablet (175 mcg total) by mouth daily before breakfast. [place] Liter P, DO Taking Active   lovastatin (MEVACOR) 40 MG tablet [id number] Yes Take 1 tablet (40 mg total) by mouth at bedtime. Valerie Roys, DO Taking Active   OneTouch Delica Lancets 63J [place] [id number] Yes USE AS DIRECTED ONCE DAILY WITH TEST STRIPS E11.22 Valerie Roys, DO Taking Active             SDOH:  (Social Determinants of Health) assessments and interventions performed: Yes SDOH Interventions    Flowsheet Row Care Coordination from 11/11/2022 in [place] from 05/06/2022 in [place] from 05/01/2020 in [place] Management from 03/29/2020 in [place] Interventions      Food Insecurity Interventions -- Intervention Not Indicated -- --  Housing Interventions -- Intervention Not  Indicated -- --  Transportation Interventions Intervention Not Indicated Intervention Not Indicated -- --  Depression Interventions/Treatment  -- -- PHQ2-9 Score <4 Follow-up Not Indicated --  Financial Strain Interventions Intervention Not Indicated Intervention Not Indicated -- Other (Comment)  [patient assistance program]  Physical Activity Interventions -- Intervention Not Indicated -- --  Stress Interventions -- Intervention Not Indicated -- Provide Counseling  Social Connections Interventions -- Intervention Not Indicated -- --       Medication Assistance:   Trulicity -[id number]: Approved -2024: Approved   Name and location of Current pharmacy:  [place] ([place]) [address] [place] [address] Phone: [phone] Fax: [phone]  [facility] [address] [place], [place] AT [facility] [place] [address] Phone: [phone] Fax: [phone]  [place] [address]), [place]) [place] [address] Phone: [phone] Fax: [place], [place] [address] [place] [address] [place] [address] Phone: [phone] Fax: [phone]   Compliance/Adherence/Medication fill history: Care Gaps: Colonoscopy-No longer required.  Diabetic Foot Exam-04/10/22 Mammogram-09/03/21 Ophthalmology-07/08/22 Dexa Scan - NA Annual Well Visit - 05/06/22 (Medicare) Micro albumin-04/10/22 (GFR/Urine ACR) Hemoglobin A1c- 08/13/22 (6.3), 04/10/22 (6.5)   Star Rating Drugs: Benazepril 40 mg-last fill 08/12/22 90 ds, 12/14/33 90 ds Trulicity 1.5 KT/6.2BW-LSLH fill 11/22/21 28 ds(Patient gets from the manufacturer, has plenty on hand til Jan) Lovastatin 40 mg-last fill 08/12/22 90 ds, 05/21/22 90 ds   Assessment/Plan   Hypertension (BP  goal <140/90)  BP Readings from Last 3 Encounters:  08/13/22 112/71  05/10/22 112/70  04/10/22 (!) 156/80  -Controlled -Current treatment: Amlodipine '10mg'$  Appropriate, Effective, Safe, Accessible Benazapril '40mg'$  Appropriate, Effective, Safe, Accessible Carvedilol '25mg'$  Appropriate, Effective, Safe, Accessible Furosemide '20mg'$  Appropriate, Effective, Safe, Accessible Clonidine 0.'1mg'$  Appropriate, Effective, Safe, Accessible -Medications previously tried: N/A  -Current home readings: Doesn't test -Current dietary habits: "Tries to eat healthy" -Current exercise habits: None -Denies hypotensive/hypertensive symptoms -Educated on BP goals and benefits of medications for prevention of heart attack, stroke and kidney damage; -Counseled to monitor BP at home weekly, document, and provide log at future appointments -Recommended to continue current medication  Hyperlipidemia: (LDL goal < 70) The ASCVD Risk score (Arnett DK, et al., 2019) failed to calculate for the following reasons:   The 2019 ASCVD risk score is only valid for ages 69 to 38 Lab Results  Component Value Date   CHOL 129 04/10/2022   CHOL 131 10/10/2021   CHOL 132 04/03/2021   Lab Results  Component Value Date   HDL 52 04/10/2022   HDL 51 10/10/2021   HDL 50 04/03/2021   Lab Results  Component Value Date   LDLCALC 53 04/10/2022   LDLCALC 58 10/10/2021   LDLCALC 59 04/03/2021   Lab Results  Component Value Date   TRIG 138 04/10/2022   TRIG 121 10/10/2021   TRIG 131 04/03/2021   Lab Results  Component Value Date   CHOLHDL 3.1 05/07/2019   CHOLHDL 2.7 04/20/2018   CHOLHDL 2.8 01/14/2017   No results found for: "LDLDIRECT" Last vitamin D Lab Results  Component Value Date   VD25OH 43.4 04/10/2022   Lab Results  Component Value Date   TSH 1.600 05/24/2022  -Controlled -Current treatment: Lovastatin '40mg'$  Appropriate, Effective, Safe, Accessible -Medications previously tried: N/A  -Current dietary  patterns: "Tries to eat healthy" -Current exercise habits: None -Educated on Cholesterol goals;  -Recommended to continue current medication  Diabetes (A1c goal <7%) Lab Results  Component Value Date   HGBA1C 6.3 (H) 08/13/2022   HGBA1C 6.5 (H) 04/10/2022   HGBA1C 6.0 (H) 10/10/2021   Lab Results  Component Value Date   MICROALBUR 30 (H) 10/10/2021   LDLCALC 53 04/10/2022   CREATININE 1.28 (H) 04/10/2022    Lab Results  Component Value Date   NA 141 04/10/2022   K 4.4 04/10/2022   CREATININE 1.28 (H) 04/10/2022   EGFR 42 (L) 04/10/2022   GFRNONAA 31 (L) 05/18/2021   GLUCOSE 185 (H) 04/10/2022    Lab Results  Component Value Date   WBC 3.6 04/10/2022   HGB 13.1 04/10/2022   HCT 41.2 04/10/2022   MCV 91 04/10/2022   PLT 88 (LL) 04/10/2022    Lab Results  Component Value Date   LABMICR See below: 10/10/2021   LABMICR See below: 10/04/2020   MICROALBUR 30 (H) 10/10/2021   MICROALBUR 80 (H) 10/04/2020  -Controlled -Current medications: Dulaglutide 1.'5mg'$  Appropriate, Effective, Safe, Accessible 2023: PAP approved 2024: PAP approved as of Jan 2024 but she hasn't gotten shipment -Medications previously tried: N/A  -Current home glucose readings fasting glucose:  Jan 2024: 170, 155,  Has the flu No hypoglycemia -Denies hypoglycemic/hyperglycemic symptoms -Current exercise: None -Educated on A1c and blood sugar goals; -Counseled to check feet daily and get yearly eye exams Jan 2024: Sugars are elevated but patient states she has the flu. Will have team reach out next month and get sugar readings. If back to normal, self care  CPP f/u prn  [place] Constable, Pharm.D. - [phone]

## 2022-11-11 NOTE — Telephone Encounter (Signed)
Copied from [place] [phone]. Topic: General - Other >> Nov 11, 2022  3:14 PM [place] B wrote: Reason for CRM: Patient called in states she spoke with [employer] where she gets the [employer] , and first says her application was approved in November but then said spoke to someone named Arizona Constable who says its not approved and she needs the medication. Please call back with further assistance.

## 2022-11-13 ENCOUNTER — Telehealth: Payer: Self-pay

## 2022-11-13 ENCOUNTER — Ambulatory Visit (INDEPENDENT_AMBULATORY_CARE_PROVIDER_SITE_OTHER): Payer: PPO | Admitting: Family Medicine

## 2022-11-13 ENCOUNTER — Encounter: Payer: Self-pay | Admitting: Family Medicine

## 2022-11-13 VITALS — BP 110/67 | HR 80 | Temp 98.2°F | Wt 169.9 lb

## 2022-11-13 DIAGNOSIS — I2510 Atherosclerotic heart disease of native coronary artery without angina pectoris: Secondary | ICD-10-CM | POA: Diagnosis not present

## 2022-11-13 DIAGNOSIS — D692 Other nonthrombocytopenic purpura: Secondary | ICD-10-CM | POA: Diagnosis not present

## 2022-11-13 DIAGNOSIS — N183 Chronic kidney disease, stage 3 unspecified: Secondary | ICD-10-CM

## 2022-11-13 DIAGNOSIS — N2581 Secondary hyperparathyroidism of renal origin: Secondary | ICD-10-CM

## 2022-11-13 DIAGNOSIS — E1122 Type 2 diabetes mellitus with diabetic chronic kidney disease: Secondary | ICD-10-CM | POA: Diagnosis not present

## 2022-11-13 DIAGNOSIS — I700 Atherosclerosis of aorta: Secondary | ICD-10-CM

## 2022-11-13 DIAGNOSIS — E785 Hyperlipidemia, unspecified: Secondary | ICD-10-CM

## 2022-11-13 DIAGNOSIS — E039 Hypothyroidism, unspecified: Secondary | ICD-10-CM | POA: Diagnosis not present

## 2022-11-13 DIAGNOSIS — K7460 Unspecified cirrhosis of liver: Secondary | ICD-10-CM

## 2022-11-13 DIAGNOSIS — E1169 Type 2 diabetes mellitus with other specified complication: Secondary | ICD-10-CM

## 2022-11-13 DIAGNOSIS — J309 Allergic rhinitis, unspecified: Secondary | ICD-10-CM | POA: Diagnosis not present

## 2022-11-13 DIAGNOSIS — Z0000 Encounter for general adult medical examination without abnormal findings: Secondary | ICD-10-CM | POA: Diagnosis not present

## 2022-11-13 DIAGNOSIS — I129 Hypertensive chronic kidney disease with stage 1 through stage 4 chronic kidney disease, or unspecified chronic kidney disease: Secondary | ICD-10-CM | POA: Diagnosis not present

## 2022-11-13 LAB — URINALYSIS, ROUTINE W REFLEX MICROSCOPIC
Bilirubin, UA: NEGATIVE
Glucose, UA: NEGATIVE
Ketones, UA: NEGATIVE
Nitrite, UA: NEGATIVE
Protein,UA: NEGATIVE
Specific Gravity, UA: 1.015 (ref 1.005–1.030)
Urobilinogen, Ur: 0.2 mg/dL (ref 0.2–1.0)
pH, UA: 5 (ref 5.0–7.5)

## 2022-11-13 LAB — MICROSCOPIC EXAMINATION

## 2022-11-13 LAB — MICROALBUMIN, URINE WAIVED
Creatinine, Urine Waived: 50 mg/dL (ref 10–300)
Microalb, Ur Waived: 80 mg/L — ABNORMAL HIGH (ref 0–19)
Microalb/Creat Ratio: >300 mg/g — ABNORMAL HIGH (ref <30)

## 2022-11-13 LAB — BAYER DCA HB A1C WAIVED: HB A1C (BAYER DCA - WAIVED): 7.2 % — ABNORMAL HIGH (ref 4.8–5.6)

## 2022-11-13 MED ORDER — CARVEDILOL 25 MG PO TABS: 25.0000 mg | ORAL_TABLET | Freq: Two times a day (BID) | ORAL | 1 refills | Status: DC

## 2022-11-13 MED ORDER — CLONIDINE HCL 0.1 MG PO TABS: ORAL_TABLET | ORAL | 1 refills | Status: DC

## 2022-11-13 MED ORDER — BENAZEPRIL HCL 40 MG PO TABS: 40.0000 mg | ORAL_TABLET | Freq: Every day | ORAL | 1 refills | Status: DC

## 2022-11-13 MED ORDER — DOXYCYCLINE HYCLATE 100 MG PO TABS: 100.0000 mg | ORAL_TABLET | Freq: Two times a day (BID) | ORAL | 0 refills | Status: DC

## 2022-11-13 MED ORDER — AMLODIPINE BESYLATE 10 MG PO TABS: 10.0000 mg | ORAL_TABLET | Freq: Every morning | ORAL | 1 refills | Status: DC

## 2022-11-13 MED ORDER — FUROSEMIDE 20 MG PO TABS: 20.0000 mg | ORAL_TABLET | Freq: Every day | ORAL | 3 refills | Status: DC

## 2022-11-13 MED ORDER — ALBUTEROL SULFATE HFA 108 (90 BASE) MCG/ACT IN AERS: 2.0000 | INHALATION_SPRAY | Freq: Four times a day (QID) | RESPIRATORY_TRACT | 6 refills | Status: DC | PRN

## 2022-11-13 MED ORDER — LOVASTATIN 40 MG PO TABS: 40.0000 mg | ORAL_TABLET | Freq: Every day | ORAL | 1 refills | Status: DC

## 2022-11-13 MED ORDER — GABAPENTIN 100 MG PO CAPS: 100.0000 mg | ORAL_CAPSULE | Freq: Every evening | ORAL | 1 refills | Status: DC | PRN

## 2022-11-13 MED ORDER — FLUTICASONE PROPIONATE 50 MCG/ACT NA SUSP: NASAL | 12 refills | Status: DC

## 2022-11-13 NOTE — Progress Notes (Signed)
BP 110/67   Pulse 80   Temp 98.2 F (36.8 C) (Oral)   Wt 169 lb 14.4 oz (77.1 kg)   SpO2 98%   BMI 32.10 kg/m    Subjective:    Patient ID: Julia Nielsen, female    DOB: 1941/05/18, 82 y.o.   MRN: [id number]  HPI: Julia Nielsen is a 82 y.o. female presenting on 11/13/2022 for comprehensive medical examination. Current medical complaints include:  UPPER RESPIRATORY TRACT INFECTION Duration: 3 weeks Worst symptom: congestion Fever: no Cough: yes Shortness of breath: no Wheezing: no Chest pain: no Chest tightness: no Chest congestion: no Nasal congestion: yes Runny nose: yes Post nasal drip: yes Sneezing: no Sore throat: no Swollen glands: no Sinus pressure: yes Headache: yes Face pain: yes Toothache: no Ear pain: yes bilateral Ear pressure: yes bilateral Eyes red/itching:no Eye drainage/crusting: no  Vomiting: no Rash: no Fatigue: yes Sick contacts: no Strep contacts: no  Context: stable Recurrent sinusitis: no Relief with OTC cold/cough medications: no  Treatments attempted:  albuterol, cold/sinus, and cough syrup   DIABETES- has 1 month of her trulicity left Hypoglycemic episodes:no Polydipsia/polyuria: no Visual disturbance: no Chest pain: no Paresthesias: no Glucose Monitoring: no  Accucheck frequency:  occasionally Taking Insulin?: no Blood Pressure Monitoring: not checking Retinal Examination: Up to Date Foot Exam: Up to Date Diabetic Education: Completed Pneumovax: Up to Date Influenza: Up to Date Aspirin: no  HYPERTENSION / HYPERLIPIDEMIA Satisfied with current treatment? yes Duration of hypertension: chronic BP monitoring frequency: not checking BP medication side effects: no Past BP meds: amlodipine, benazepril, lasix, carvedilol, clonidine Duration of hyperlipidemia: chronic Cholesterol medication side effects: no Cholesterol supplements: none Past cholesterol medications: lovastatin Medication compliance: excellent  compliance Aspirin: no Recent stressors: no Recurrent headaches: no Visual changes: no Palpitations: no Dyspnea: no Chest pain: no Lower extremity edema: no Dizzy/lightheaded: no  Menopausal Symptoms: no  Depression Screen done today and results listed below:     11/13/2022    8:18 AM 08/13/2022    8:50 AM 05/06/2022    8:41 AM 10/10/2021    8:22 AM 05/04/2021    9:03 AM  Depression screen PHQ 2/9  Decreased Interest 0 0 0 0 0  Down, Depressed, Hopeless 0 0 0 0 0  PHQ - 2 Score 0 0 0 0 0  Altered sleeping 0 0  0   Tired, decreased energy 0 0  0   Change in appetite 0 0  0   Feeling bad or failure about yourself  0 0  0   Trouble concentrating 0 0  0   Moving slowly or fidgety/restless 0 0  0   Suicidal thoughts 0 0     PHQ-9 Score 0 0  0   Difficult doing work/chores Not difficult at all Not difficult at all       Past Medical History:  Past Medical History:  Diagnosis Date   Arthritis    OSTEO OF KNEE   Breast cancer ([place]) 2000   left breast cancer, radiation tx's.   CAD (coronary artery disease)    Carotid artery stenosis    bilateral   Cataract    Chronic kidney disease    Chronic stage 3   Coronary heart disease    Diabetes mellitus without complication (HCC)    Edema of both legs    GERD (gastroesophageal reflux disease)    Glaucoma    Hyperlipemia    mixed   Hyperlipidemia    Hypertension  Hypothyroidism    Mitral insufficiency    moderate   Osteopenia    Osteopenia    Personal history of radiation therapy 2000   LEFT lumpectomy   Stenosis of carotid artery    Thrombocytopenia (HCC)    Tricuspid insufficiency    moderate    Surgical History:  Past Surgical History:  Procedure Laterality Date   APPENDECTOMY     BLADDER SURGERY     BREAST EXCISIONAL BIOPSY Left 2000   breast ca   BREAST LUMPECTOMY Left    BREAST SURGERY     CARDIAC CATHETERIZATION     CARPAL TUNNEL RELEASE Bilateral    CHOLECYSTECTOMY     COLONOSCOPY      COLONOSCOPY WITH PROPOFOL N/A 08/12/2018   Procedure: COLONOSCOPY WITH PROPOFOL;  Surgeon: Manya Silvas, MD;  Location: [facility] ENDOSCOPY;  Service: Endoscopy;  Laterality: N/A;   JOINT REPLACEMENT Right 2015   knee   parathyroid surgery     SHOULDER SURGERY Right    TUBAL LIGATION      Medications:  Current Outpatient Medications on File Prior to Visit  Medication Sig   Biotin w/ Vitamins C & E (HAIR/SKIN/NAILS PO) Take 1 tablet by mouth daily.   Blood Glucose Monitoring Suppl (ONE TOUCH ULTRA 2) w/Device KIT 1 each by Does not apply route daily.   brimonidine (ALPHAGAN) 0.2 % ophthalmic solution 1 drop 2 (two) times daily.   dorzolamide-timolol (COSOPT) 22.3-6.8 MG/ML ophthalmic solution Place 1 drop into both eyes 2 (two) times daily.    Dulaglutide (TRULICITY) 1.5 TM/1.9QQ SOPN Inject 1.5 mg into the skin once a week.   fexofenadine (ALLEGRA) 180 MG tablet Take 180 mg by mouth daily.   latanoprost (XALATAN) 0.005 % ophthalmic solution Place 1 drop into both eyes at bedtime.    levothyroxine (SYNTHROID) 175 MCG tablet Take 1 tablet (175 mcg total) by mouth daily before breakfast.   OneTouch Delica Lancets 22L MISC USE AS DIRECTED ONCE DAILY WITH TEST STRIPS E11.22   ONETOUCH ULTRA test strip USE TO CHECK BLOOD SUGAR AS NEEDED AS INSTRUCTED   No current facility-administered medications on file prior to visit.    Allergies:  Allergies  Allergen Reactions   Prednisone     Elevated blood sugar     Social History:  Social History   Socioeconomic History   Marital status: Married    Spouse name: Not on file   Number of children: Not on file   Years of education: Not on file   Highest education level: High school graduate  Occupational History   Occupation: retired  Tobacco Use   Smoking status: Never   Smokeless tobacco: Never  Vaping Use   Vaping Use: Never used  Substance and Sexual Activity   Alcohol use: No   Drug use: No   Sexual activity: Not Currently   Other Topics Concern   Not on file  Social History Narrative   Not on file   Social Determinants of Health   Financial Resource Strain: Low Risk  (11/11/2022)   Overall Financial Resource Strain (CARDIA)    Difficulty of Paying Living Expenses: Not hard at all  Food Insecurity: No Food Insecurity (05/06/2022)   Hunger Vital Sign    Worried About Running Out of Food in the Last Year: Never true    [place] in the Last Year: Never true  Transportation Needs: No Transportation Needs (11/11/2022)   PRAPARE - Hydrologist (Medical): No  Lack of Transportation (Non-Medical): No  Physical Activity: Sufficiently Active (05/06/2022)   Exercise Vital Sign    Days of Exercise per Week: 5 days    Minutes of Exercise per Session: 50 min  Stress: No Stress Concern Present (05/06/2022)   [place]    Feeling of Stress : Not at all  Social Connections: Moderately Integrated (05/06/2022)   Social Connection and Isolation Panel [NHANES]    Frequency of Communication with Friends and Family: Three times a week    Frequency of Social Gatherings with Friends and Family: Once a week    Attends Religious Services: More than 4 times per year    Active Member of [employer] or Organizations: No    Attends Archivist Meetings: Never    Marital Status: Married  Human resources officer Violence: Not At Risk (05/06/2022)   Humiliation, Afraid, Rape, and Kick questionnaire    Fear of Current or Ex-Partner: No    Emotionally Abused: No    Physically Abused: No    Sexually Abused: No   Social History   Tobacco Use  Smoking Status Never  Smokeless Tobacco Never   Social History   Substance and Sexual Activity  Alcohol Use No    Family History:  Family History  Problem Relation Age of Onset   Gallbladder disease Mother    Aneurysm Mother    COPD Father    Congestive Heart Failure Father     Emphysema Father    Leukemia Sister    Stroke Brother    Dementia Brother    Alzheimer's disease Brother    Congestive Heart Failure Sister    Emphysema Sister    Breast cancer Neg Hx     Past medical history, surgical history, medications, allergies, family history and social history reviewed with patient today and changes made to appropriate areas of the chart.   Review of Systems  Constitutional: Negative.   HENT:  Positive for congestion, ear pain and sinus pain. Negative for ear discharge, hearing loss, nosebleeds, sore throat and tinnitus.   Eyes: Negative.   Respiratory: Negative.  Negative for stridor.   Cardiovascular: Negative.   Gastrointestinal:  Positive for diarrhea. Negative for abdominal pain, blood in stool, constipation, heartburn, melena, nausea and vomiting.  Genitourinary: Negative.   Musculoskeletal: Negative.   Skin: Negative.   Neurological: Negative.   Endo/Heme/Allergies:  Negative for environmental allergies and polydipsia. Bruises/bleeds easily.  Psychiatric/Behavioral: Negative.     All other ROS negative except what is listed above and in the HPI.      Objective:    BP 110/67   Pulse 80   Temp 98.2 F (36.8 C) (Oral)   Wt 169 lb 14.4 oz (77.1 kg)   SpO2 98%   BMI 32.10 kg/m   Wt Readings from Last 3 Encounters:  11/13/22 169 lb 14.4 oz (77.1 kg)  08/13/22 171 lb (77.6 kg)  05/10/22 169 lb 8 oz (76.9 kg)    Physical Exam Vitals and nursing note reviewed.  Constitutional:      General: She is not in acute distress.    Appearance: Normal appearance. She is not ill-appearing, toxic-appearing or diaphoretic.  HENT:     Head: Normocephalic and atraumatic.     Right Ear: Tympanic membrane, ear canal and external ear normal. There is no impacted cerumen.     Left Ear: Tympanic membrane, ear canal and external ear normal. There is no impacted cerumen.  Nose: Nose normal. No congestion or rhinorrhea.     Mouth/Throat:     Mouth: Mucous  membranes are moist.     Pharynx: Oropharynx is clear. No oropharyngeal exudate or posterior oropharyngeal erythema.  Eyes:     General: No scleral icterus.       Right eye: No discharge.        Left eye: No discharge.     Extraocular Movements: Extraocular movements intact.     Conjunctiva/sclera: Conjunctivae normal.     Pupils: Pupils are equal, round, and reactive to light.  Neck:     Vascular: No carotid bruit.  Cardiovascular:     Rate and Rhythm: Normal rate and regular rhythm.     Pulses: Normal pulses.     Heart sounds: No murmur heard.    No friction rub. No gallop.  Pulmonary:     Effort: Pulmonary effort is normal. No respiratory distress.     Breath sounds: Normal breath sounds. No stridor. No wheezing, rhonchi or rales.  Chest:     Chest wall: No tenderness.  Abdominal:     General: Abdomen is flat. Bowel sounds are normal. There is no distension.     Palpations: Abdomen is soft. There is no mass.     Tenderness: There is no abdominal tenderness. There is no right CVA tenderness, left CVA tenderness, guarding or rebound.     Hernia: No hernia is present.  Genitourinary:    Comments: Breast and pelvic exams deferred with shared decision making Musculoskeletal:        General: No swelling, tenderness, deformity or signs of injury.     Cervical back: Normal range of motion and neck supple. No rigidity. No muscular tenderness.     Right lower leg: No edema.     Left lower leg: No edema.  Lymphadenopathy:     Cervical: No cervical adenopathy.  Skin:    General: Skin is warm and dry.     Capillary Refill: Capillary refill takes less than 2 seconds.     Coloration: Skin is not jaundiced or pale.     Findings: No bruising, erythema, lesion or rash.  Neurological:     General: No focal deficit present.     Mental Status: She is alert and oriented to person, place, and time. Mental status is at baseline.     Cranial Nerves: No cranial nerve deficit.     Sensory: No  sensory deficit.     Motor: No weakness.     Coordination: Coordination normal.     Gait: Gait normal.     Deep Tendon Reflexes: Reflexes normal.  Psychiatric:        Mood and Affect: Mood normal.        Behavior: Behavior normal.        Thought Content: Thought content normal.        Judgment: Judgment normal.     Results for orders placed or performed in visit on 11/13/22  Microscopic Examination   Urine  Result Value Ref Range   WBC, UA 6-10 (A) 0 - 5 /hpf   RBC, Urine 0-2 0 - 2 /hpf   Epithelial Cells (non renal) 0-10 0 - 10 /hpf   Bacteria, UA Few None seen/Few  Bayer DCA Hb A1c Waived  Result Value Ref Range   HB A1C (BAYER DCA - WAIVED) 7.2 (H) 4.8 - 5.6 %  Microalbumin, Urine Waived  Result Value Ref Range   Microalb, Ur Waived 80 (H)  0 - 19 mg/L   Creatinine, Urine Waived 50 10 - 300 mg/dL   Microalb/Creat Ratio >300 (H) <30 mg/g  CBC with Differential/Platelet  Result Value Ref Range   WBC 5.6 3.4 - 10.8 x10E3/uL   RBC 4.04 3.77 - 5.28 x10E6/uL   Hemoglobin 11.9 11.1 - 15.9 g/dL   Hematocrit 36.3 34.0 - 46.6 %   MCV 90 79 - 97 fL   MCH 29.5 26.6 - 33.0 pg   MCHC 32.8 31.5 - 35.7 g/dL   RDW 13.3 11.7 - 15.4 %   Platelets 146 (L) 150 - 450 x10E3/uL   Neutrophils 68 Not Estab. %   Lymphs 21 Not Estab. %   Monocytes 9 Not Estab. %   Eos 1 Not Estab. %   Basos 0 Not Estab. %   Neutrophils Absolute 3.8 1.4 - 7.0 x10E3/uL   Lymphocytes Absolute 1.2 0.7 - 3.1 x10E3/uL   Monocytes Absolute 0.5 0.1 - 0.9 x10E3/uL   EOS (ABSOLUTE) 0.1 0.0 - 0.4 x10E3/uL   Basophils Absolute 0.0 0.0 - 0.2 x10E3/uL   Immature Granulocytes 1 Not Estab. %   Immature Grans (Abs) 0.0 0.0 - 0.1 x10E3/uL  Comprehensive metabolic panel  Result Value Ref Range   Glucose 187 (H) 70 - 99 mg/dL   BUN 16 8 - 27 mg/dL   Creatinine, Ser 1.16 (H) 0.57 - 1.00 mg/dL   eGFR 47 (L) >59 mL/min/1.73   BUN/Creatinine Ratio 14 12 - 28   Sodium 141 134 - 144 mmol/L   Potassium 3.9 3.5 - 5.2 mmol/L    Chloride 105 96 - 106 mmol/L   CO2 20 20 - 29 mmol/L   Calcium 8.8 8.7 - 10.3 mg/dL   Total Protein 6.0 6.0 - 8.5 g/dL   Albumin 3.6 (L) 3.7 - 4.7 g/dL   Globulin, Total 2.4 1.5 - 4.5 g/dL   Albumin/Globulin Ratio 1.5 1.2 - 2.2   Bilirubin Total 0.4 0.0 - 1.2 mg/dL   Alkaline Phosphatase 83 44 - 121 IU/L   AST 22 0 - 40 IU/L   ALT 18 0 - 32 IU/L  Lipid Panel w/o Chol/HDL Ratio  Result Value Ref Range   Cholesterol, Total 106 100 - 199 mg/dL   Triglycerides 142 0 - 149 mg/dL   HDL 40 >39 mg/dL   VLDL Cholesterol Cal 25 5 - 40 mg/dL   LDL Chol Calc (NIH) 41 0 - 99 mg/dL  Urinalysis, Routine w reflex microscopic  Result Value Ref Range   Specific Gravity, UA 1.015 1.005 - 1.030   pH, UA 5.0 5.0 - 7.5   Color, UA Yellow Yellow   Appearance Ur Cloudy (A) Clear   Leukocytes,UA 2+ (A) Negative   Protein,UA Negative Negative/Trace   Glucose, UA Negative Negative   Ketones, UA Negative Negative   RBC, UA Trace (A) Negative   Bilirubin, UA Negative Negative   Urobilinogen, Ur 0.2 0.2 - 1.0 mg/dL   Nitrite, UA Negative Negative   Microscopic Examination See below:   TSH  Result Value Ref Range   TSH 2.080 0.450 - 4.500 uIU/mL      Assessment & Plan:   Problem List Items Addressed This Visit       Cardiovascular and Mediastinum   CAD (coronary artery disease)    Will keep BP and cholesterol under good control. Continue to monitor. Call with any concerns.       Relevant Medications   amLODipine (NORVASC) 10 MG tablet   benazepril (LOTENSIN)  40 MG tablet   carvedilol (COREG) 25 MG tablet   cloNIDine (CATAPRES) 0.1 MG tablet   furosemide (LASIX) 20 MG tablet   lovastatin (MEVACOR) 40 MG tablet   Other Relevant Orders   Microalbumin, Urine Waived (Completed)   CBC with Differential/Platelet (Completed)   Comprehensive metabolic panel (Completed)   Atherosclerosis of aorta (HCC)    Will keep BP and cholesterol under good control. Continue to monitor. Call with any concerns.        Relevant Medications   amLODipine (NORVASC) 10 MG tablet   benazepril (LOTENSIN) 40 MG tablet   carvedilol (COREG) 25 MG tablet   cloNIDine (CATAPRES) 0.1 MG tablet   furosemide (LASIX) 20 MG tablet   lovastatin (MEVACOR) 40 MG tablet   Other Relevant Orders   Microalbumin, Urine Waived (Completed)   CBC with Differential/Platelet (Completed)   Comprehensive metabolic panel (Completed)   Senile purpura ([place])    Reassured patient. Continue to monitor.       Relevant Medications   amLODipine (NORVASC) 10 MG tablet   benazepril (LOTENSIN) 40 MG tablet   carvedilol (COREG) 25 MG tablet   cloNIDine (CATAPRES) 0.1 MG tablet   furosemide (LASIX) 20 MG tablet   lovastatin (MEVACOR) 40 MG tablet   Other Relevant Orders   Microalbumin, Urine Waived (Completed)   CBC with Differential/Platelet (Completed)   Comprehensive metabolic panel (Completed)     Respiratory   Multiple respiratory allergies   Relevant Medications   fluticasone (FLONASE) 50 MCG/ACT nasal spray   Other Relevant Orders   Microalbumin, Urine Waived (Completed)   CBC with Differential/Platelet (Completed)   Comprehensive metabolic panel (Completed)     Digestive   Cirrhosis of liver without ascites (HCC)    Stable. Continue to monitor. Call with any concerns.         Endocrine   Hyperlipidemia associated with type 2 diabetes mellitus ([place])    Under good control on current regimen. Continue current regimen. Continue to monitor. Call with any concerns. Refills given. Labs drawn today.        Relevant Medications   amLODipine (NORVASC) 10 MG tablet   benazepril (LOTENSIN) 40 MG tablet   carvedilol (COREG) 25 MG tablet   cloNIDine (CATAPRES) 0.1 MG tablet   furosemide (LASIX) 20 MG tablet   lovastatin (MEVACOR) 40 MG tablet   Other Relevant Orders   Microalbumin, Urine Waived (Completed)   CBC with Differential/Platelet (Completed)   Comprehensive metabolic panel (Completed)   Lipid Panel w/o  Chol/HDL Ratio (Completed)   Type 2 diabetes mellitus with stage 3 chronic kidney disease (HCC)    Up slightly with A1c of 7.3 up from 6.2. Will really work on her diet and recheck 3 months. Call with any concerns.       Relevant Medications   benazepril (LOTENSIN) 40 MG tablet   lovastatin (MEVACOR) 40 MG tablet   Other Relevant Orders   Bayer DCA Hb A1c Waived (Completed)   Microalbumin, Urine Waived (Completed)   CBC with Differential/Platelet (Completed)   Comprehensive metabolic panel (Completed)   Urinalysis, Routine w reflex microscopic (Completed)   Secondary hyperparathyroidism of renal origin ([place])    Rechecking labs today. Await results. Treat as needed.       Hypothyroidism    Rechecking labs today. Await results. Treat as needed.       Relevant Medications   carvedilol (COREG) 25 MG tablet   Other Relevant Orders   Microalbumin, Urine Waived (Completed)   CBC with  Differential/Platelet (Completed)   Comprehensive metabolic panel (Completed)   TSH (Completed)     Genitourinary   Benign hypertensive renal disease    Under good control on current regimen. Continue current regimen. Continue to monitor. Call with any concerns. Refills given. Labs drawn today.       Relevant Orders   Microalbumin, Urine Waived (Completed)   CBC with Differential/Platelet (Completed)   Comprehensive metabolic panel (Completed)   Other Visit Diagnoses     Routine general medical examination at a health care facility    -  Primary   Vaccines up to date. Screening labs checked today. Pap, mammo and colonoscopy N/A. DEXA up to date. Continue diet and exercise. Continue to monitor.        Follow up plan: Return in about 3 months (around 02/11/2023).   LABORATORY TESTING:  - Pap smear: not applicable  IMMUNIZATIONS:   - Tdap: Tetanus vaccination status reviewed: last tetanus booster within 10 years. - Influenza: Up to date - Pneumovax: Up to date - Prevnar: Up to date -  COVID: Up to date - HPV: Not applicable - Shingrix vaccine: Not applicable  SCREENING: -Mammogram: Up to date  - Colonoscopy: Not applicable  - Bone Density: Up to date    PATIENT COUNSELING:   Advised to take 1 mg of folate supplement per day if capable of pregnancy.   Sexuality: Discussed sexually transmitted diseases, partner selection, use of condoms, avoidance of unintended pregnancy  and contraceptive alternatives.   Advised to avoid cigarette smoking.  I discussed with the patient that most people either abstain from alcohol or drink within safe limits (<=14/week and <=4 drinks/occasion for males, <=7/weeks and <= 3 drinks/occasion for females) and that the risk for alcohol disorders and other health effects rises proportionally with the number of drinks per week and how often a drinker exceeds daily limits.  Discussed cessation/primary prevention of drug use and availability of treatment for abuse.   Diet: Encouraged to adjust caloric intake to maintain  or achieve ideal body weight, to reduce intake of dietary saturated fat and total fat, to limit sodium intake by avoiding high sodium foods and not adding table salt, and to maintain adequate dietary potassium and calcium preferably from fresh fruits, vegetables, and low-fat dairy products.    stressed the importance of regular exercise  Injury prevention: Discussed safety belts, safety helmets, smoke detector, smoking near bedding or upholstery.   Dental health: Discussed importance of regular tooth brushing, flossing, and dental visits.    NEXT PREVENTATIVE PHYSICAL DUE IN 1 YEAR. Return in about 3 months (around 02/11/2023).

## 2022-11-13 NOTE — Progress Notes (Cosign Needed)
Patient is currently enrolled in [place] patient assistance program for the medication Trulicity until date: 91/79/15.  Patient would like to re-enroll in patient assistance for the next calendar year.  Renewal forms were completed on behalf of the patient and will be mailed out 11/15/2022. Patient will bring completed application along with proof of income to [facility] to be signed and dated by provider.  Patient has requested to receive forms in the mail to complete. Forms will be printed and mailed to patient.   Ethelene Hal

## 2022-11-14 ENCOUNTER — Encounter: Payer: Self-pay | Admitting: Family Medicine

## 2022-11-14 LAB — COMPREHENSIVE METABOLIC PANEL
ALT: 18 IU/L (ref 0–32)
AST: 22 IU/L (ref 0–40)
Albumin/Globulin Ratio: 1.5 (ref 1.2–2.2)
Albumin: 3.6 g/dL — ABNORMAL LOW (ref 3.7–4.7)
Alkaline Phosphatase: 83 IU/L (ref 44–121)
BUN/Creatinine Ratio: 14 (ref 12–28)
BUN: 16 mg/dL (ref 8–27)
Bilirubin Total: 0.4 mg/dL (ref 0.0–1.2)
CO2: 20 mmol/L (ref 20–29)
Calcium: 8.8 mg/dL (ref 8.7–10.3)
Chloride: 105 mmol/L (ref 96–106)
Creatinine, Ser: 1.16 mg/dL — ABNORMAL HIGH (ref 0.57–1.00)
Globulin, Total: 2.4 g/dL (ref 1.5–4.5)
Glucose: 187 mg/dL — ABNORMAL HIGH (ref 70–99)
Potassium: 3.9 mmol/L (ref 3.5–5.2)
Sodium: 141 mmol/L (ref 134–144)
Total Protein: 6 g/dL (ref 6.0–8.5)
eGFR: 47 mL/min/{1.73_m2} — ABNORMAL LOW (ref >59)

## 2022-11-14 LAB — CBC WITH DIFFERENTIAL/PLATELET
Basophils Absolute: 0 10*3/uL (ref 0.0–0.2)
Basos: 0 %
EOS (ABSOLUTE): 0.1 10*3/uL (ref 0.0–0.4)
Eos: 1 %
Hematocrit: 36.3 % (ref 34.0–46.6)
Hemoglobin: 11.9 g/dL (ref 11.1–15.9)
Immature Grans (Abs): 0 10*3/uL (ref 0.0–0.1)
Immature Granulocytes: 1 %
Lymphocytes Absolute: 1.2 10*3/uL (ref 0.7–3.1)
Lymphs: 21 %
MCH: 29.5 pg (ref 26.6–33.0)
MCHC: 32.8 g/dL (ref 31.5–35.7)
MCV: 90 fL (ref 79–97)
Monocytes Absolute: 0.5 10*3/uL (ref 0.1–0.9)
Monocytes: 9 %
Neutrophils Absolute: 3.8 10*3/uL (ref 1.4–7.0)
Neutrophils: 68 %
Platelets: 146 10*3/uL — ABNORMAL LOW (ref 150–450)
RBC: 4.04 x10E6/uL (ref 3.77–5.28)
RDW: 13.3 % (ref 11.7–15.4)
WBC: 5.6 10*3/uL (ref 3.4–10.8)

## 2022-11-14 LAB — LIPID PANEL W/O CHOL/HDL RATIO
Cholesterol, Total: 106 mg/dL (ref 100–199)
HDL: 40 mg/dL (ref >39)
LDL Chol Calc (NIH): 41 mg/dL (ref 0–99)
Triglycerides: 142 mg/dL (ref 0–149)
VLDL Cholesterol Cal: 25 mg/dL (ref 5–40)

## 2022-11-14 LAB — TSH: TSH: 2.08 u[IU]/mL (ref 0.450–4.500)

## 2022-11-18 NOTE — Assessment & Plan Note (Signed)
Rechecking labs today. Await results. Treat as needed.  °

## 2022-11-18 NOTE — Assessment & Plan Note (Signed)
Under good control on current regimen. Continue current regimen. Continue to monitor. Call with any concerns. Refills given. Labs drawn today.   

## 2022-11-18 NOTE — Assessment & Plan Note (Signed)
Reassured patient. Continue to monitor.  

## 2022-11-18 NOTE — Assessment & Plan Note (Signed)
Will keep BP and cholesterol under good control. Continue to monitor. Call with any concerns.

## 2022-11-18 NOTE — Assessment & Plan Note (Signed)
Stable. Continue to monitor. Call with any concerns.  ?

## 2022-11-18 NOTE — Assessment & Plan Note (Signed)
Up slightly with A1c of 7.3 up from 6.2. Will really work on her diet and recheck 3 months. Call with any concerns.

## 2022-11-19 ENCOUNTER — Telehealth: Payer: Self-pay | Admitting: Family Medicine

## 2022-11-19 NOTE — Telephone Encounter (Signed)
-----   Message from Wendy Poet sent at 11/19/2022 10:26 AM EST ----- Regarding: Side effects to medications Good morning Dr. Wynetta Emery'  My name is Julia Nielsen an associate with [facility]. I got a call from patient Julia Nielsen who wanted to let me know that she has not received her PAP application for Trulicity that was mailed out on 11/15/22. I told the patient to wait to see if it comes tomorrow if not I will print out the application and she can come to the office and sign which she agreed.  Also while we were on the phone the patient stated that she was started on Albuterol and Doxycycline and that both of these medications make her sick on her stomach. She stated that she has stopped taking the medications and that she does not want anymore refills due to the stomach ache. I told the patient that I would forward a message to you to let you know, please advise if there is something else she can take.  Ethelene Hal

## 2022-11-19 NOTE — Telephone Encounter (Signed)
Spoke with patient and she says between the Albuterol and Doxycyline medication, she is afraid to take them due to one or the either is causing her some discomfort right underneath her breast area. Patient says since she has stopped the medications, she has not have any more discomfort. Patient says her sinus are getting better. Patient says she has been trying hot Ginger Tea and Mucinex and it has been helping her clear up the mucus.

## 2022-11-19 NOTE — Telephone Encounter (Signed)
No need for new med if she's feeling better.

## 2022-11-19 NOTE — Telephone Encounter (Signed)
Please find out how she's feeling- if she is still having issues with her sinus infection please let me know. Please also make sure she's been taking her doxycycline on a full stomach.

## 2022-11-20 ENCOUNTER — Telehealth: Payer: Self-pay

## 2022-11-20 ENCOUNTER — Telehealth: Payer: Self-pay | Admitting: Family Medicine

## 2022-11-20 NOTE — Telephone Encounter (Signed)
Paperwork for [employer] need [employer].  Placed in provider's folder.

## 2022-11-20 NOTE — Progress Notes (Cosign Needed)
Care Management & Coordination Services Pharmacy Team  Reason for Encounter: Diabetes  Contacted patient to discuss diabetes disease state. Spoke with patient on 11/20/2022   Recent office visits:  11/13/22-Megan Annia Friendly, DO (PCP) Seen for a medical examination. Labs ordered. Follow up in 3 months.   Recent consult visits:  None noted  Hospital visits:  None in previous 6 months  Medications: Outpatient Encounter Medications as of 11/20/2022  Medication Sig   albuterol (VENTOLIN HFA) 108 (90 Base) MCG/ACT inhaler Inhale 2 puffs into the lungs every 6 (six) hours as needed for wheezing or shortness of breath.   amLODipine (NORVASC) 10 MG tablet Take 1 tablet (10 mg total) by mouth every morning.   benazepril (LOTENSIN) 40 MG tablet Take 1 tablet (40 mg total) by mouth daily.   Biotin w/ Vitamins C & E (HAIR/SKIN/NAILS PO) Take 1 tablet by mouth daily.   Blood Glucose Monitoring Suppl (ONE TOUCH ULTRA 2) w/Device KIT 1 each by Does not apply route daily.   brimonidine (ALPHAGAN) 0.2 % ophthalmic solution 1 drop 2 (two) times daily.   carvedilol (COREG) 25 MG tablet Take 1 tablet (25 mg total) by mouth 2 (two) times daily with a meal.   cloNIDine (CATAPRES) 0.1 MG tablet TAKE ONE TABLET BY MOUTH EVERY MORNING and TAKE ONE TABLET BY MOUTH EVERY EVENING   dorzolamide-timolol (COSOPT) 22.3-6.8 MG/ML ophthalmic solution Place 1 drop into both eyes 2 (two) times daily.    doxycycline (VIBRA-TABS) 100 MG tablet Take 1 tablet (100 mg total) by mouth 2 (two) times daily.   Dulaglutide (TRULICITY) 1.5 HC/6.2BJ SOPN Inject 1.5 mg into the skin once a week.   fexofenadine (ALLEGRA) 180 MG tablet Take 180 mg by mouth daily.   fluticasone (FLONASE) 50 MCG/ACT nasal spray Use 2 spray(s) in each nostril once daily   furosemide (LASIX) 20 MG tablet Take 1 tablet (20 mg total) by mouth daily.   gabapentin (NEURONTIN) 100 MG capsule Take 1 capsule (100 mg total) by mouth at bedtime as needed.    latanoprost (XALATAN) 0.005 % ophthalmic solution Place 1 drop into both eyes at bedtime.    levothyroxine (SYNTHROID) 175 MCG tablet Take 1 tablet (175 mcg total) by mouth daily before breakfast.   lovastatin (MEVACOR) 40 MG tablet Take 1 tablet (40 mg total) by mouth at bedtime.   OneTouch Delica Lancets 62G MISC USE AS DIRECTED ONCE DAILY WITH TEST STRIPS E11.22   ONETOUCH ULTRA test strip USE TO CHECK BLOOD SUGAR AS NEEDED AS INSTRUCTED   No facility-administered encounter medications on file as of 11/20/2022.    Recent Relevant Labs: Lab Results  Component Value Date/Time   HGBA1C 7.2 (H) 11/13/2022 08:31 AM   HGBA1C 6.3 (H) 08/13/2022 09:08 AM   MICROALBUR 80 (H) 11/13/2022 08:31 AM   MICROALBUR 30 (H) 10/10/2021 08:33 AM    Kidney Function Lab Results  Component Value Date/Time   CREATININE 1.16 (H) 11/13/2022 08:33 AM   CREATININE 1.28 (H) 04/10/2022 08:41 AM   CREATININE 1.32 (H) 06/02/2014 04:28 AM   CREATININE 1.38 (H) 06/01/2014 05:05 AM   GFRNONAA 31 (L) 05/18/2021 11:14 AM   GFRNONAA 39 (L) 06/02/2014 04:28 AM   GFRAA 46 (L) 10/04/2020 08:20 AM   GFRAA 46 (L) 06/02/2014 04:28 AM   Current antihyperglycemic regimen:  Trulicity 1.5 mg inject 1.5 mg once a week.  Patient verbally confirms she is taking the above medications as directed. Yes  What diet changes have been made to improve  diabetes control? Patient states she has been eating about the same no changes.  What recent interventions/DTPs have been made to improve glycemic control:  Patient   Have there been any recent hospitalizations or ED visits since last visit with PharmD? No  Patient denies hypoglycemic symptoms, including None  Patient denies hyperglycemic symptoms, including none  How often are you checking your blood sugar? once daily  What are your blood sugars ranging?  Fasting:11/20/22-175 Before meals:  After meals:  Bedtime:  Patient states she had a sinus infection the past week and  her blood sugar was up due to the antibiotics that she was on her bloods sugars were 223,190 these were the only readings she could provide.  During the week, how often does your blood glucose drop below 70? Never  Are you checking your feet daily/regularly? Yes  Adherence Review: Is the patient currently on a STATIN medication? Yes Is the patient currently on ACE/ARB medication? Yes Does the patient have >5 day gap between last estimated fill dates? No  Star Rating Drugs:  Benazepril 40 mg Last filled:11/08/22 90 DS, 08/12/22 90 DS Carvedilol 25 mg Last filled:11/08/22 90 DS, 93/90/30 90 DS Trulicity 1.5 mg Last SPQZRA:07/62/26 28 DS, 01/01/21 0 DS Lovastatin 40 mg Last filled:11/08/22 90 DS, 08/12/22 90 DS   Care Gaps: Annual wellness visit in last year? Yes Last eye exam / retinopathy screening:Scheduled on 07/09/23 Last diabetic foot exam:Scheduled on 04/11/23    [name], RMA

## 2022-11-27 ENCOUNTER — Telehealth: Payer: Self-pay

## 2022-11-27 NOTE — Telephone Encounter (Signed)
Spoke with patient about Trulicity per msg from [employer].    Patient states she has 1 month of med left. I double checked and asked her again because both Destiny and Shontae/Tracy said patient told her 1 pen but patient affirmed 1 month.  [employer] will fax PAP out tomorrow.   Patient is adamant the company can do overnight delivery so I Counseled patient to call on 12/04/22 to get it overnighted (In case she really does have 1 pen left).   Patient was frustrated about the whole process and said, "This will not happen again." I apologized and said I understood how frustrating the process is and explained, in the nicest way possible, that the PAP wasn't done because in December she told us that she had already been approved. Olivia Mackie, went above and beyond, last week to call company and find out that patient was not approved and immediately re-did paperwork and both mailed/printed out for patient.  Met with both Tracy/Shontae via Teams to coordinate care for patient

## 2022-11-27 NOTE — Telephone Encounter (Signed)
[employer] hasn't received form for pts Trulicity / pt stated she is down to one box / pt doesn't want to run out / please call pt and advise when paperwork has been submitted

## 2022-12-09 NOTE — Progress Notes (Cosign Needed)
Care Management & Coordination Services Pharmacy Team  Reason for Encounter: Patient assistance renewal   Patient is currently enrolled in [place] patient assistance program for the medication Trulicity. Start date 12/03/22, until date: 10/14/23.  Spoke with patient this morning and made her aware that she has been approved and that shipment sent out but may take 10-14 days for delivery.  Julia Nielsen

## 2023-01-02 DIAGNOSIS — I700 Atherosclerosis of aorta: Secondary | ICD-10-CM | POA: Diagnosis not present

## 2023-01-02 DIAGNOSIS — I10 Essential (primary) hypertension: Secondary | ICD-10-CM | POA: Diagnosis not present

## 2023-01-02 DIAGNOSIS — I6523 Occlusion and stenosis of bilateral carotid arteries: Secondary | ICD-10-CM | POA: Diagnosis not present

## 2023-01-02 DIAGNOSIS — R9431 Abnormal electrocardiogram [ECG] [EKG]: Secondary | ICD-10-CM | POA: Diagnosis not present

## 2023-01-02 DIAGNOSIS — N1831 Chronic kidney disease, stage 3a: Secondary | ICD-10-CM | POA: Diagnosis not present

## 2023-01-02 DIAGNOSIS — E119 Type 2 diabetes mellitus without complications: Secondary | ICD-10-CM | POA: Diagnosis not present

## 2023-01-02 DIAGNOSIS — I2510 Atherosclerotic heart disease of native coronary artery without angina pectoris: Secondary | ICD-10-CM | POA: Diagnosis not present

## 2023-01-02 DIAGNOSIS — E785 Hyperlipidemia, unspecified: Secondary | ICD-10-CM | POA: Diagnosis not present

## 2023-01-02 DIAGNOSIS — N179 Acute kidney failure, unspecified: Secondary | ICD-10-CM | POA: Diagnosis not present

## 2023-01-06 DIAGNOSIS — H401132 Primary open-angle glaucoma, bilateral, moderate stage: Secondary | ICD-10-CM | POA: Diagnosis not present

## 2023-01-06 DIAGNOSIS — H16223 Keratoconjunctivitis sicca, not specified as Sjogren's, bilateral: Secondary | ICD-10-CM | POA: Diagnosis not present

## 2023-01-18 ENCOUNTER — Other Ambulatory Visit: Payer: Self-pay | Admitting: Family Medicine

## 2023-01-18 DIAGNOSIS — N183 Chronic kidney disease, stage 3 unspecified: Secondary | ICD-10-CM

## 2023-01-20 DIAGNOSIS — H401132 Primary open-angle glaucoma, bilateral, moderate stage: Secondary | ICD-10-CM | POA: Diagnosis not present

## 2023-01-20 DIAGNOSIS — H16223 Keratoconjunctivitis sicca, not specified as Sjogren's, bilateral: Secondary | ICD-10-CM | POA: Diagnosis not present

## 2023-01-20 NOTE — Telephone Encounter (Signed)
Requested Prescriptions  Pending Prescriptions Disp Refills   ONETOUCH ULTRA test strip [Pharmacy Med Name: OneTouch Ultra Blue In Vitro Strip] 100 each 0    Sig: USE TO CHECK BLOOD SUGAR AS NEEDED AS INSTRUCTED     Endocrinology: Diabetes - Testing Supplies Passed - 01/18/2023  9:48 AM      Passed - Valid encounter within last 12 months    Recent Outpatient Visits           2 months ago Routine general medical examination at a health care facility   [facility], Megan P, DO   5 months ago Type 2 diabetes mellitus with stage 3 chronic kidney disease, without long-term current use of insulin, unspecified whether stage 3a or 3b CKD (HCC)   [place] [facility] [place], Megan P, DO   8 months ago Neuropathy   [place] [facility] [place], Megan P, DO   9 months ago Coronary artery disease involving native coronary artery of native heart without angina pectoris   [facility] [place], Megan P, DO   1 year ago Routine general medical examination at a health care facility   [facility] Dorcas Carrow, DO       Future Appointments             In 3 weeks Dorcas Carrow, DO [facility], [facility]

## 2023-01-28 DIAGNOSIS — H16223 Keratoconjunctivitis sicca, not specified as Sjogren's, bilateral: Secondary | ICD-10-CM | POA: Diagnosis not present

## 2023-02-11 ENCOUNTER — Encounter: Payer: Self-pay | Admitting: Family Medicine

## 2023-02-11 ENCOUNTER — Ambulatory Visit (INDEPENDENT_AMBULATORY_CARE_PROVIDER_SITE_OTHER): Payer: PPO | Admitting: Family Medicine

## 2023-02-11 VITALS — BP 125/71 | HR 80 | Temp 97.9°F | Wt 169.5 lb

## 2023-02-11 DIAGNOSIS — N183 Chronic kidney disease, stage 3 unspecified: Secondary | ICD-10-CM | POA: Diagnosis not present

## 2023-02-11 DIAGNOSIS — E1122 Type 2 diabetes mellitus with diabetic chronic kidney disease: Secondary | ICD-10-CM | POA: Diagnosis not present

## 2023-02-11 LAB — BAYER DCA HB A1C WAIVED: HB A1C (BAYER DCA - WAIVED): 6.8 % — ABNORMAL HIGH (ref 4.8–5.6)

## 2023-02-11 NOTE — Progress Notes (Signed)
BP 125/71   Pulse 80   Temp 97.9 F (36.6 C) (Oral)   Wt 169 lb 8 oz (76.9 kg)   SpO2 97%   BMI 32.03 kg/m    Subjective:    Patient ID: Julia Nielsen, female    DOB: 09-Feb-1941, 82 y.o.   MRN: [id number]  HPI: Julia Nielsen is a 82 y.o. female  Chief Complaint  Patient presents with   Diabetes   DIABETES Hypoglycemic episodes:no Polydipsia/polyuria: no Visual disturbance: no Chest pain: no Paresthesias: no Glucose Monitoring: yes  Accucheck frequency: Not Checking Taking Insulin?: no Blood Pressure Monitoring: not checking Retinal Examination: Up to Date Foot Exam: Up to Date Diabetic Education: Completed Pneumovax: Up to Date Influenza: Up to Date Aspirin: yes   Relevant past medical, surgical, family and social history reviewed and updated as indicated. Interim medical history since our last visit reviewed. Allergies and medications reviewed and updated.  Review of Systems  Constitutional: Negative.   Respiratory: Negative.    Cardiovascular: Negative.   Gastrointestinal: Negative.   Musculoskeletal: Negative.   Neurological: Negative.   Psychiatric/Behavioral: Negative.      Per HPI unless specifically indicated above     Objective:    BP 125/71   Pulse 80   Temp 97.9 F (36.6 C) (Oral)   Wt 169 lb 8 oz (76.9 kg)   SpO2 97%   BMI 32.03 kg/m   Wt Readings from Last 3 Encounters:  02/11/23 169 lb 8 oz (76.9 kg)  11/13/22 169 lb 14.4 oz (77.1 kg)  08/13/22 171 lb (77.6 kg)    Physical Exam Vitals and nursing note reviewed.  Constitutional:      General: She is not in acute distress.    Appearance: Normal appearance. She is not ill-appearing, toxic-appearing or diaphoretic.  HENT:     Head: Normocephalic and atraumatic.     Right Ear: External ear normal.     Left Ear: External ear normal.     Nose: Nose normal.     Mouth/Throat:     Mouth: Mucous membranes are moist.     Pharynx: Oropharynx is clear.  Eyes:     General: No  scleral icterus.       Right eye: No discharge.        Left eye: No discharge.     Extraocular Movements: Extraocular movements intact.     Conjunctiva/sclera: Conjunctivae normal.     Pupils: Pupils are equal, round, and reactive to light.  Cardiovascular:     Rate and Rhythm: Normal rate and regular rhythm.     Pulses: Normal pulses.     Heart sounds: Normal heart sounds. No murmur heard.    No friction rub. No gallop.  Pulmonary:     Effort: Pulmonary effort is normal. No respiratory distress.     Breath sounds: Normal breath sounds. No stridor. No wheezing, rhonchi or rales.  Chest:     Chest wall: No tenderness.  Musculoskeletal:        General: Normal range of motion.     Cervical back: Normal range of motion and neck supple.  Skin:    General: Skin is warm and dry.     Capillary Refill: Capillary refill takes less than 2 seconds.     Coloration: Skin is not jaundiced or pale.     Findings: No bruising, erythema, lesion or rash.  Neurological:     General: No focal deficit present.     Mental Status: She  is alert and oriented to person, place, and time. Mental status is at baseline.  Psychiatric:        Mood and Affect: Mood normal.        Behavior: Behavior normal.        Thought Content: Thought content normal.        Judgment: Judgment normal.     Results for orders placed or performed in visit on 11/13/22  Microscopic Examination   Urine  Result Value Ref Range   WBC, UA 6-10 (A) 0 - 5 /hpf   RBC, Urine 0-2 0 - 2 /hpf   Epithelial Cells (non renal) 0-10 0 - 10 /hpf   Bacteria, UA Few None seen/Few  Bayer DCA Hb A1c Waived  Result Value Ref Range   HB A1C (BAYER DCA - WAIVED) 7.2 (H) 4.8 - 5.6 %  Microalbumin, Urine Waived  Result Value Ref Range   Microalb, Ur Waived 80 (H) 0 - 19 mg/L   Creatinine, Urine Waived 50 10 - 300 mg/dL   Microalb/Creat Ratio >300 (H) <30 mg/g  CBC with Differential/Platelet  Result Value Ref Range   WBC 5.6 3.4 - 10.8 x10E3/uL    RBC 4.04 3.77 - 5.28 x10E6/uL   Hemoglobin 11.9 11.1 - 15.9 g/dL   Hematocrit 96.0 45.4 - 46.6 %   MCV 90 79 - 97 fL   MCH 29.5 26.6 - 33.0 pg   MCHC 32.8 31.5 - 35.7 g/dL   RDW 09.8 11.9 - 14.7 %   Platelets 146 (L) 150 - 450 x10E3/uL   Neutrophils 68 Not Estab. %   Lymphs 21 Not Estab. %   Monocytes 9 Not Estab. %   Eos 1 Not Estab. %   Basos 0 Not Estab. %   Neutrophils Absolute 3.8 1.4 - 7.0 x10E3/uL   Lymphocytes Absolute 1.2 0.7 - 3.1 x10E3/uL   Monocytes Absolute 0.5 0.1 - 0.9 x10E3/uL   EOS (ABSOLUTE) 0.1 0.0 - 0.4 x10E3/uL   Basophils Absolute 0.0 0.0 - 0.2 x10E3/uL   Immature Granulocytes 1 Not Estab. %   Immature Grans (Abs) 0.0 0.0 - 0.1 x10E3/uL  Comprehensive metabolic panel  Result Value Ref Range   Glucose 187 (H) 70 - 99 mg/dL   BUN 16 8 - 27 mg/dL   Creatinine, Ser 8.29 (H) 0.57 - 1.00 mg/dL   eGFR 47 (L) >56 OZ/HYQ/6.57   BUN/Creatinine Ratio 14 12 - 28   Sodium 141 134 - 144 mmol/L   Potassium 3.9 3.5 - 5.2 mmol/L   Chloride 105 96 - 106 mmol/L   CO2 20 20 - 29 mmol/L   Calcium 8.8 8.7 - 10.3 mg/dL   Total Protein 6.0 6.0 - 8.5 g/dL   Albumin 3.6 (L) 3.7 - 4.7 g/dL   Globulin, Total 2.4 1.5 - 4.5 g/dL   Albumin/Globulin Ratio 1.5 1.2 - 2.2   Bilirubin Total 0.4 0.0 - 1.2 mg/dL   Alkaline Phosphatase 83 44 - 121 IU/L   AST 22 0 - 40 IU/L   ALT 18 0 - 32 IU/L  Lipid Panel w/o Chol/HDL Ratio  Result Value Ref Range   Cholesterol, Total 106 100 - 199 mg/dL   Triglycerides 846 0 - 149 mg/dL   HDL 40 >96 mg/dL   VLDL Cholesterol Cal 25 5 - 40 mg/dL   LDL Chol Calc (NIH) 41 0 - 99 mg/dL  Urinalysis, Routine w reflex microscopic  Result Value Ref Range   Specific Gravity, UA 1.015 1.005 -  1.030   pH, UA 5.0 5.0 - 7.5   Color, UA Yellow Yellow   Appearance Ur Cloudy (A) Clear   Leukocytes,UA 2+ (A) Negative   Protein,UA Negative Negative/Trace   Glucose, UA Negative Negative   Ketones, UA Negative Negative   RBC, UA Trace (A) Negative    Bilirubin, UA Negative Negative   Urobilinogen, Ur 0.2 0.2 - 1.0 mg/dL   Nitrite, UA Negative Negative   Microscopic Examination See below:   TSH  Result Value Ref Range   TSH 2.080 0.450 - 4.500 uIU/mL      Assessment & Plan:   Problem List Items Addressed This Visit       Endocrine   Type 2 diabetes mellitus with stage 3 chronic kidney disease (HCC) - Primary    Doing well with A1c of 6.8. Continue current regimen. Continue to monitor. Recheck 3 months. Cal with any concerns.       Relevant Orders   Bayer DCA Hb A1c Waived     Follow up plan: Return in about 3 months (around 05/13/2023).

## 2023-02-11 NOTE — Assessment & Plan Note (Signed)
Doing well with A1c of 6.8. Continue current regimen. Continue to monitor. Recheck 3 months. Cal with any concerns.

## 2023-02-12 DIAGNOSIS — H16223 Keratoconjunctivitis sicca, not specified as Sjogren's, bilateral: Secondary | ICD-10-CM | POA: Diagnosis not present

## 2023-02-13 ENCOUNTER — Telehealth: Payer: Self-pay

## 2023-02-13 NOTE — Progress Notes (Cosign Needed)
Care Management & Coordination Services Pharmacy Team  Reason for Encounter: Diabetes  Contacted patient to discuss diabetes disease state. Spoke with patient on 02/25/2023    Recent office visits:  02/11/23-Megan Holly Bodily, DO (PCP) Seen for a diabetic follow up visit. Labs ordered. Follow up in 3 months.  11/13/22-Megan Holly Bodily, DO (PCP) Seen for upper respiratory tract infection. Labs ordered. Follow up in 3 months.   Recent consult visits:  01/02/23-Akshay Pendyal, MD (Cardiology) Seen for hypertension follow up visit.   Hospital visits:  None in previous 6 months  Medications: Outpatient Encounter Medications as of 02/13/2023  Medication Sig   albuterol (VENTOLIN HFA) 108 (90 Base) MCG/ACT inhaler Inhale 2 puffs into the lungs every 6 (six) hours as needed for wheezing or shortness of breath.   amLODipine (NORVASC) 10 MG tablet Take 1 tablet (10 mg total) by mouth every morning.   benazepril (LOTENSIN) 40 MG tablet Take 1 tablet (40 mg total) by mouth daily.   Biotin w/ Vitamins C & E (HAIR/SKIN/NAILS PO) Take 1 tablet by mouth daily.   Blood Glucose Monitoring Suppl (ONE TOUCH ULTRA 2) w/Device KIT 1 each by Does not apply route daily.   brimonidine (ALPHAGAN) 0.2 % ophthalmic solution 1 drop 2 (two) times daily.   carvedilol (COREG) 25 MG tablet Take 1 tablet (25 mg total) by mouth 2 (two) times daily with a meal.   cloNIDine (CATAPRES) 0.1 MG tablet TAKE ONE TABLET BY MOUTH EVERY MORNING and TAKE ONE TABLET BY MOUTH EVERY EVENING   dorzolamide-timolol (COSOPT) 22.3-6.8 MG/ML ophthalmic solution Place 1 drop into both eyes 2 (two) times daily.    doxycycline (VIBRA-TABS) 100 MG tablet Take 1 tablet (100 mg total) by mouth 2 (two) times daily.   Dulaglutide (TRULICITY) 1.5 MG/0.5ML SOPN Inject 1.5 mg into the skin once a week.   fexofenadine (ALLEGRA) 180 MG tablet Take 180 mg by mouth daily.   fluticasone (FLONASE) 50 MCG/ACT nasal spray Use 2 spray(s) in each nostril once  daily   furosemide (LASIX) 20 MG tablet Take 1 tablet (20 mg total) by mouth daily.   gabapentin (NEURONTIN) 100 MG capsule Take 1 capsule (100 mg total) by mouth at bedtime as needed.   latanoprost (XALATAN) 0.005 % ophthalmic solution Place 1 drop into both eyes at bedtime.    levothyroxine (SYNTHROID) 175 MCG tablet Take 1 tablet (175 mcg total) by mouth daily before breakfast.   lovastatin (MEVACOR) 40 MG tablet Take 1 tablet (40 mg total) by mouth at bedtime.   OneTouch Delica Lancets 33G MISC USE AS DIRECTED ONCE DAILY WITH TEST STRIPS E11.22   ONETOUCH ULTRA test strip USE TO CHECK BLOOD SUGAR AS NEEDED AS INSTRUCTED   No facility-administered encounter medications on file as of 02/13/2023.    Recent Relevant Labs: Lab Results  Component Value Date/Time   HGBA1C 6.8 (H) 02/11/2023 09:24 AM   HGBA1C 7.2 (H) 11/13/2022 08:31 AM   MICROALBUR 80 (H) 11/13/2022 08:31 AM   MICROALBUR 30 (H) 10/10/2021 08:33 AM    Kidney Function Lab Results  Component Value Date/Time   CREATININE 1.16 (H) 11/13/2022 08:33 AM   CREATININE 1.28 (H) 04/10/2022 08:41 AM   CREATININE 1.32 (H) 06/02/2014 04:28 AM   CREATININE 1.38 (H) 06/01/2014 05:05 AM   GFRNONAA 31 (L) 05/18/2021 11:14 AM   GFRNONAA 39 (L) 06/02/2014 04:28 AM   GFRAA 46 (L) 10/04/2020 08:20 AM   GFRAA 46 (L) 06/02/2014 04:28 AM   Current antihyperglycemic regimen:  Trulicity 1.5 mg inject 1.5 mg weekly    Patient verbally confirms she is taking the above medications as directed. Yes  What diet changes have been made to improve diabetes control? Patient states she tries to stay away from sugary foods. Patient states she has been doing natural fruit & vegetables smoothies that she does daily.  What recent interventions/DTPs have been made to improve glycemic control:  None noted  Have there been any recent hospitalizations or ED visits since last visit with PharmD? No  Patient denies hypoglycemic symptoms, including Pale,  Sweaty, Shaky, Hungry, Nervous/irritable, and Vision changes  Patient reports hyperglycemic symptoms, including weakness  How often are you checking your blood sugar? once daily  What are your blood sugars ranging?  Fasting: 02/24/22-209 Before meals:  After meals:  Bedtime:   During the week, how often does your blood glucose drop below 70? Never  Are you checking your feet daily/regularly? Yes  Adherence Review: Is the patient currently on a STATIN medication? Yes Is the patient currently on ACE/ARB medication? Yes Does the patient have >5 day gap between last estimated fill dates? No  Star Rating Drugs:  Trulicity 1.5 mg Last filled:01/01/21 N/A DS, 11/22/21 28 DS Amlodipine 10 mg Last filled:11/08/22 90 DS, 01/19/23 90 DS Benazepril 40 mg Last filled:11/08/22 90 DS, 01/19/23 90 DS Lovastatin 40 mg Last filled:11/08/22 90 DS, 01/19/23 90 DS  Care Gaps: Annual wellness visit in last year? Yes 05/06/22 Last eye exam / retinopathy screening:07/08/22 Last diabetic foot exam:04/10/22   Rance Muir, RMA

## 2023-03-12 DIAGNOSIS — H16223 Keratoconjunctivitis sicca, not specified as Sjogren's, bilateral: Secondary | ICD-10-CM | POA: Diagnosis not present

## 2023-04-14 DIAGNOSIS — H401132 Primary open-angle glaucoma, bilateral, moderate stage: Secondary | ICD-10-CM | POA: Diagnosis not present

## 2023-04-14 LAB — HM DIABETES EYE EXAM

## 2023-04-15 ENCOUNTER — Other Ambulatory Visit: Payer: Self-pay | Admitting: Family Medicine

## 2023-04-15 DIAGNOSIS — N183 Chronic kidney disease, stage 3 unspecified: Secondary | ICD-10-CM

## 2023-04-15 DIAGNOSIS — E039 Hypothyroidism, unspecified: Secondary | ICD-10-CM

## 2023-04-16 NOTE — Telephone Encounter (Signed)
Unable to refill per protocol, Rx requests are too soon.  Requested Prescriptions  Pending Prescriptions Disp Refills   levothyroxine (SYNTHROID) 175 MCG tablet [Pharmacy Med Name: Levothyroxine Sodium 175 MCG Oral Tablet] 90 tablet 0    Sig: TAKE 1 TABLET BY MOUTH ONCE DAILY BEFORE BREAKFAST     Endocrinology:  Hypothyroid Agents Passed - 04/15/2023  9:27 AM      Passed - TSH in normal range and within 360 days    TSH  Date Value Ref Range Status  11/13/2022 2.080 0.450 - 4.500 uIU/mL Final         Passed - Valid encounter within last 12 months    Recent Outpatient Visits           2 months ago Type 2 diabetes mellitus with stage 3 chronic kidney disease, without long-term current use of insulin, unspecified whether stage 3a or 3b CKD (HCC)   [facility] [place], Megan P, DO   5 months ago Routine general medical examination at a health care facility   [facility], [place] P, DO   8 months ago Type 2 diabetes mellitus with stage 3 chronic kidney disease, without long-term current use of insulin, unspecified whether stage 3a or 3b CKD (HCC)   [place] [place], Megan P, DO   11 months ago Neuropathy   [place] [facility] [place], [place], DO   1 year ago Coronary artery disease involving native coronary artery of native heart without angina pectoris   [place] [facility] [place], Oralia Rud, DO       Future Appointments             In 1 month Johnson, Oralia Rud, DO [place] [facility], [facility]             [facility] ULTRA test strip [facility] Med Name: OneTouch Ultra Blue In Vitro Strip] 100 each 0    Sig: USE TO CHECK BLOOD SUGAR AS NEEDED AS DIRECTED     Endocrinology: Diabetes - Testing Supplies Passed - 04/15/2023  9:27 AM      Passed - Valid encounter within last 12 months    Recent Outpatient Visits           2 months ago Type 2 diabetes  mellitus with stage 3 chronic kidney disease, without long-term current use of insulin, unspecified whether stage 3a or 3b CKD (HCC)   [place] [facility] [place], Megan P, DO   5 months ago Routine general medical examination at a health care facility   [facility], [place] P, DO   8 months ago Type 2 diabetes mellitus with stage 3 chronic kidney disease, without long-term current use of insulin, unspecified whether stage 3a or 3b CKD (HCC)   [facility] [place], Megan P, DO   11 months ago Neuropathy   [place] [facility] [place], [place], DO   1 year ago Coronary artery disease involving native coronary artery of native heart without angina pectoris    [facility] [place], Oralia Rud, DO       Future Appointments             In 1 month [place], Oralia Rud, DO  [facility], [facility]

## 2023-04-24 ENCOUNTER — Other Ambulatory Visit: Payer: Self-pay | Admitting: Family Medicine

## 2023-04-24 DIAGNOSIS — E039 Hypothyroidism, unspecified: Secondary | ICD-10-CM

## 2023-04-24 DIAGNOSIS — E1122 Type 2 diabetes mellitus with diabetic chronic kidney disease: Secondary | ICD-10-CM

## 2023-04-24 NOTE — Telephone Encounter (Signed)
Requested Prescriptions  Pending Prescriptions Disp Refills   levothyroxine (SYNTHROID) 175 MCG tablet [Pharmacy Med Name: Levothyroxine Sodium 175 MCG Oral Tablet] 90 tablet 0    Sig: TAKE 1 TABLET BY MOUTH ONCE DAILY BEFORE BREAKFAST     Endocrinology:  Hypothyroid Agents Passed - 04/24/2023  7:57 AM      Passed - TSH in normal range and within 360 days    TSH  Date Value Ref Range Status  11/13/2022 2.080 0.450 - 4.500 uIU/mL Final         Passed - Valid encounter within last 12 months    Recent Outpatient Visits           2 months ago Type 2 diabetes mellitus with stage 3 chronic kidney disease, without long-term current use of insulin, unspecified whether stage 3a or 3b CKD (HCC)   [facility] [place], Megan P, DO   5 months ago Routine general medical examination at a health care facility   [facility] [place] P, DO   8 months ago Type 2 diabetes mellitus with stage 3 chronic kidney disease, without long-term current use of insulin, unspecified whether stage 3a or 3b CKD (HCC)   [place] [facility] [place], Megan P, DO   11 months ago Neuropathy   [place] [facility] [place], [place], DO   1 year ago Coronary artery disease involving native coronary artery of native heart without angina pectoris   [place] [facility] [place], Oralia Rud, DO       Future Appointments             In 3 weeks [place], Oralia Rud, DO [facility]             [facility] ULTRA test strip [place] Med Name: OneTouch Ultra Blue In Vitro Strip] 100 each 0    Sig: USE TO CHECK BLOOD SUGAR AS NEEDED AS DIRECTED     Endocrinology: Diabetes - Testing Supplies Passed - 04/24/2023  7:57 AM      Passed - Valid encounter within last 12 months    Recent Outpatient Visits           2 months ago Type 2 diabetes mellitus with stage 3 chronic kidney disease, without long-term  current use of insulin, unspecified whether stage 3a or 3b CKD (HCC)   [place] [facility] [place], Megan P, DO   5 months ago Routine general medical examination at a health care facility   [facility] [place] P, DO   8 months ago Type 2 diabetes mellitus with stage 3 chronic kidney disease, without long-term current use of insulin, unspecified whether stage 3a or 3b CKD (HCC)   [place] [facility] [place], Megan P, DO   11 months ago Neuropathy   [place] [facility] [place], [place], DO   1 year ago Coronary artery disease involving native coronary artery of native heart without angina pectoris   [place] [facility] [place], Oralia Rud, DO       Future Appointments             In 3 weeks Laural Benes, Oralia Rud, DO [place] [facility]

## 2023-04-29 ENCOUNTER — Other Ambulatory Visit: Payer: Self-pay | Admitting: Family Medicine

## 2023-04-29 DIAGNOSIS — E039 Hypothyroidism, unspecified: Secondary | ICD-10-CM

## 2023-05-13 ENCOUNTER — Ambulatory Visit (INDEPENDENT_AMBULATORY_CARE_PROVIDER_SITE_OTHER): Payer: PPO

## 2023-05-13 VITALS — Ht 61.5 in | Wt 169.0 lb

## 2023-05-13 DIAGNOSIS — Z0000 Encounter for general adult medical examination without abnormal findings: Secondary | ICD-10-CM | POA: Diagnosis not present

## 2023-05-13 NOTE — Progress Notes (Signed)
Subjective:   Julia Nielsen is a 82 y.o. female who presents for Medicare Annual (Subsequent) preventive examination.  Visit Complete: Virtual  I connected with  Julia Nielsen on 05/13/23 by a audio enabled telemedicine application and verified that I am speaking with the correct person using two identifiers.  Patient Location: Home  Provider Location: Office/Clinic  I discussed the limitations of evaluation and management by telemedicine. The patient expressed understanding and agreed to proceed.  Review of Systems     Cardiac Risk Factors include: advanced age (>56men, >13 women);diabetes mellitus;dyslipidemia;hypertension, obesity     Objective:    Today's Vitals   05/13/23 0945  Weight: 169 lb (76.7 kg)  Height: 5' 1.5" (1.562 m)   Body mass index is 31.42 kg/m.     05/13/2023    9:37 AM 05/06/2022    8:35 AM 05/18/2021    9:01 AM 05/04/2021    9:02 AM 05/01/2020   10:33 AM 04/19/2019    8:55 AM 08/12/2018    8:12 AM  Advanced Directives  Does Patient Have a Medical Advance Directive? No No No No No No No  Would patient like information on creating a medical advance directive? No - Patient declined No - Patient declined No - Patient declined    No - Patient declined    Current Medications (verified) Outpatient Encounter Medications as of 05/13/2023  Medication Sig   amLODipine (NORVASC) 10 MG tablet Take 1 tablet (10 mg total) by mouth every morning.   benazepril (LOTENSIN) 40 MG tablet Take 1 tablet (40 mg total) by mouth daily.   Biotin w/ Vitamins C & E (HAIR/SKIN/NAILS PO) Take 1 tablet by mouth daily.   Blood Glucose Monitoring Suppl (ONE TOUCH ULTRA 2) w/Device KIT 1 each by Does not apply route daily.   brimonidine (ALPHAGAN) 0.2 % ophthalmic solution 1 drop 2 (two) times daily.   carvedilol (COREG) 25 MG tablet Take 1 tablet (25 mg total) by mouth 2 (two) times daily with a meal.   cloNIDine (CATAPRES) 0.1 MG tablet TAKE ONE TABLET BY MOUTH EVERY MORNING and  TAKE ONE TABLET BY MOUTH EVERY EVENING   dorzolamide-timolol (COSOPT) 22.3-6.8 MG/ML ophthalmic solution Place 1 drop into both eyes 2 (two) times daily.    doxycycline (VIBRA-TABS) 100 MG tablet Take 1 tablet (100 mg total) by mouth 2 (two) times daily.   Dulaglutide (TRULICITY) 1.5 MG/0.5ML SOPN Inject 1.5 mg into the skin once a week.   fexofenadine (ALLEGRA) 180 MG tablet Take 180 mg by mouth daily.   fluticasone (FLONASE) 50 MCG/ACT nasal spray Use 2 spray(s) in each nostril once daily   furosemide (LASIX) 20 MG tablet Take 1 tablet (20 mg total) by mouth daily.   latanoprost (XALATAN) 0.005 % ophthalmic solution Place 1 drop into both eyes at bedtime.    levothyroxine (SYNTHROID) 175 MCG tablet TAKE 1 TABLET BY MOUTH ONCE DAILY BEFORE BREAKFAST   lovastatin (MEVACOR) 40 MG tablet Take 1 tablet (40 mg total) by mouth at bedtime.   OneTouch Delica Lancets 33G MISC USE AS DIRECTED ONCE DAILY WITH TEST STRIPS E11.22   ONETOUCH ULTRA test strip USE TO CHECK BLOOD SUGAR AS NEEDED AS DIRECTED   albuterol (VENTOLIN HFA) 108 (90 Base) MCG/ACT inhaler Inhale 2 puffs into the lungs every 6 (six) hours as needed for wheezing or shortness of breath. (Patient not taking: Reported on 05/13/2023)   gabapentin (NEURONTIN) 100 MG capsule Take 1 capsule (100 mg total) by mouth at bedtime  as needed. (Patient not taking: Reported on 05/13/2023)   No facility-administered encounter medications on file as of 05/13/2023.    Allergies (verified) Nirmatrelvir-ritonavir and Prednisone   History: Past Medical History:  Diagnosis Date   Arthritis    OSTEO OF KNEE   Breast cancer (HCC) 2000   left breast cancer, radiation tx's.   CAD (coronary artery disease)    Carotid artery stenosis    bilateral   Cataract    Chronic kidney disease    Chronic stage 3   Coronary heart disease    Diabetes mellitus without complication (HCC)    Edema of both legs    GERD (gastroesophageal reflux disease)    Glaucoma     Hyperlipemia    mixed   Hyperlipidemia    Hypertension    Hypothyroidism    Mitral insufficiency    moderate   Osteopenia    Osteopenia    Personal history of radiation therapy 2000   LEFT lumpectomy   Stenosis of carotid artery    Thrombocytopenia (HCC)    Tricuspid insufficiency    moderate   Past Surgical History:  Procedure Laterality Date   APPENDECTOMY     BLADDER SURGERY     BREAST EXCISIONAL BIOPSY Left 2000   breast ca   BREAST LUMPECTOMY Left    BREAST SURGERY     CARDIAC CATHETERIZATION     CARPAL TUNNEL RELEASE Bilateral    CHOLECYSTECTOMY     COLONOSCOPY     COLONOSCOPY WITH PROPOFOL N/A 08/12/2018   Procedure: COLONOSCOPY WITH PROPOFOL;  Surgeon: Scot Jun, MD;  Location[facility] ENDOSCOPY;  Service: Endoscopy;  Laterality: N/A;   JOINT REPLACEMENT Right 2015   knee   parathyroid surgery     SHOULDER SURGERY Right    TUBAL LIGATION     Family History  Problem Relation Age of Onset   Gallbladder disease Mother    Aneurysm Mother    COPD Father    Congestive Heart Failure Father    Emphysema Father    Leukemia Sister    Stroke Brother    Dementia Brother    Alzheimer's disease Brother    Congestive Heart Failure Sister    Emphysema Sister    Breast cancer Neg Hx    Social History   Socioeconomic History   Marital status: Married    Spouse name: Not on file   Number of children: Not on file   Years of education: Not on file   Highest education level: High school graduate  Occupational History   Occupation: retired  Tobacco Use   Smoking status: Never   Smokeless tobacco: Never  Vaping Use   Vaping status: Never Used  Substance and Sexual Activity   Alcohol use: No   Drug use: No   Sexual activity: Not Currently  Other Topics Concern   Not on file  Social History Narrative   Not on file   Social Determinants of Health   Financial Resource Strain: Low Risk  (05/13/2023)   Overall Financial Resource Strain (CARDIA)     Difficulty of Paying Living Expenses: Not hard at all  Food Insecurity: No Food Insecurity (05/13/2023)   Hunger Vital Sign    Worried About Running Out of Food in the Last Year: Never true    Ran Out of Food in the Last Year: Never true  Transportation Needs: No Transportation Needs (05/13/2023)   PRAPARE - Administrator, Civil Service (Medical): No  Lack of Transportation (Non-Medical): No  Physical Activity: Sufficiently Active (05/13/2023)   Exercise Vital Sign    Days of Exercise per Week: 7 days    Minutes of Exercise per Session: 30 min  Stress: No Stress Concern Present (05/13/2023)   [employer] of Occupational Health - Occupational Stress Questionnaire    Feeling of Stress : Not at all  Social Connections: Unknown (05/13/2023)   Social Connection and Isolation Panel [NHANES]    Frequency of Communication with Friends and Family: Twice a week    Frequency of Social Gatherings with Friends and Family: Not on file    Attends Religious Services: Never    Database administrator or Organizations: No    Attends Engineer, structural: Never    Marital Status: Married    Tobacco Counseling Counseling given: Not Answered   Clinical Intake:  Pre-visit preparation completed: Yes  Pain : No/denies pain     Nutritional Risks: None Diabetes: Yes CBG done?: No Did pt. bring in CBG monitor from home?: No  How often do you need to have someone help you when you read instructions, pamphlets, or other written materials from your doctor or pharmacy?: 1 - Never  Interpreter Needed?: No  Information entered by :: Kennedy Bucker, LPN   Activities of Daily Living    05/13/2023    9:37 AM  In your present state of health, do you have any difficulty performing the following activities:  Hearing? 0  Vision? 0  Difficulty concentrating or making decisions? 0  Walking or climbing stairs? 0  Dressing or bathing? 0  Doing errands, shopping? 0  Preparing  Food and eating ? N  Using the Toilet? N  In the past six months, have you accidently leaked urine? N  Do you have problems with loss of bowel control? N  Managing your Medications? N  Managing your Finances? N  Housekeeping or managing your Housekeeping? N    Patient Care Team: Dorcas Carrow, DO as PCP - General (Family Medicine) Zettie Pho, [facility] (Inactive) (Pharmacist)  Indicate any recent Medical Services you may have received from other than [facility] providers in the past year (date may be approximate).     Assessment:   This is a routine wellness examination for Julia Nielsen.  Hearing/Vision screen Hearing Screening - Comments:: No aids Vision Screening - Comments:: Wears glasses- Dr.Dingeldein   Dietary issues and exercise activities discussed:     Goals Addressed             This Visit's Progress    DIET - EAT MORE FRUITS AND VEGETABLES         Depression Screen    05/13/2023    9:34 AM 02/11/2023    9:00 AM 11/13/2022    8:18 AM 08/13/2022    8:50 AM 05/06/2022    8:41 AM 10/10/2021    8:22 AM 05/04/2021    9:03 AM  PHQ 2/9 Scores  PHQ - 2 Score 0 0 0 0 0 0 0  PHQ- 9 Score 0 0 0 0  0     Fall Risk    05/13/2023    9:37 AM 02/11/2023    8:59 AM 11/13/2022    8:17 AM 08/13/2022    8:50 AM 05/06/2022    8:38 AM  Fall Risk   Falls in the past year? 0 0 0 0 0  Number falls in past yr: 0 0 0 0 0  Injury with Fall? 0 0  0 0 0  Risk for fall due to : No Fall Risks No Fall Risks No Fall Risks No Fall Risks   Follow up Falls prevention discussed;Falls evaluation completed Falls evaluation completed Falls evaluation completed Falls evaluation completed Falls evaluation completed;Education provided;Falls prevention discussed    MEDICARE RISK AT HOME:  Medicare Risk at Home - 05/13/23 1610     Any stairs in or around the home? Yes    If so, are there any without handrails? No    Home free of loose throw rugs in walkways, pet beds, electrical cords, etc? Yes     Adequate lighting in your home to reduce risk of falls? Yes    Life alert? No    Use of a cane, walker or w/c? Yes   cane   Grab bars in the bathroom? Yes    Shower chair or bench in shower? No    Elevated toilet seat or a handicapped toilet? No             TIMED UP AND GO:  Was the test performed?  No    Cognitive Function:        05/13/2023    9:41 AM 05/06/2022    8:36 AM 05/04/2021    9:05 AM 05/01/2020   10:36 AM 06/22/2019    8:35 AM  6CIT Screen  What Year? 0 points 0 points 0 points 0 points 0 points  What month? 0 points 0 points 0 points 0 points 0 points  What time? 0 points 0 points 0 points 0 points 0 points  Count back from 20 2 points 0 points 0 points 0 points 0 points  Months in reverse 0 points 0 points 0 points 0 points 0 points  Repeat phrase 0 points 0 points 0 points 0 points 0 points  Total Score 2 points 0 points 0 points 0 points 0 points    Immunizations Immunization History  Administered Date(s) Administered   Fluad Quad(high Dose 65+) 06/22/2019, 07/13/2020   Influenza, High Dose Seasonal PF 06/18/2016, 08/27/2017, 08/06/2018, 08/23/2021, 07/17/2022   Influenza,inj,Quad PF,6+ Mos 07/05/2015   Moderna Sars-Covid-2 Vaccination 02/29/2020, 03/28/2020, 10/10/2020   PNEUMOCOCCAL CONJUGATE-20 08/23/2021   Pneumococcal Conjugate-13 12/08/2014   Pneumococcal-Unspecified 08/15/1999, 06/18/2005   Td 01/23/2009, 10/04/2020    TDAP status: Up to date  Flu Vaccine status: Up to date  Pneumococcal vaccine status: Up to date  Covid-19 vaccine status: Completed vaccines  Qualifies for Shingles Vaccine? Yes   Zostavax completed No   Shingrix Completed?: No.    Education has been provided regarding the importance of this vaccine. Patient has been advised to call insurance company to determine out of pocket expense if they have not yet received this vaccine. Advised may also receive vaccine at local pharmacy or Health Dept. Verbalized acceptance and  understanding.  Screening Tests Health Maintenance  Topic Date Due   Zoster Vaccines- Shingrix (1 of 2) Never done   COVID-19 Vaccine (4 - 2023-24 season) 06/14/2022   FOOT EXAM  04/11/2023   INFLUENZA VACCINE  05/15/2023   OPHTHALMOLOGY EXAM  07/09/2023   HEMOGLOBIN A1C  08/13/2023   MAMMOGRAM  09/17/2023   Diabetic kidney evaluation - eGFR measurement  11/14/2023   Diabetic kidney evaluation - Urine ACR  11/14/2023   Medicare Annual Wellness (AWV)  05/12/2024   DTaP/Tdap/Td (3 - Tdap) 10/04/2030   Pneumonia Vaccine 56+ Years old  Completed   DEXA SCAN  Completed   HPV VACCINES  Aged Out  Colonoscopy  Discontinued    Health Maintenance  Health Maintenance Due  Topic Date Due   Zoster Vaccines- Shingrix (1 of 2) Never done   COVID-19 Vaccine (4 - 2023-24 season) 06/14/2022   FOOT EXAM  04/11/2023    Colorectal cancer screening: No longer required.   Mammogram status: No longer required due to age.  Bone Density status: Completed 01/08/22. Results reflect: Bone density results: OSTEOPENIA. Repeat every 5 years.  Lung Cancer Screening: (Low Dose CT Chest recommended if Age 34-80 years, 20 pack-year currently smoking OR have quit w/in 15years.) does not qualify.    Additional Screening:  Hepatitis C Screening: does not qualify; Completed no  Vision Screening: Recommended annual ophthalmology exams for early detection of glaucoma and other disorders of the eye. Is the patient up to date with their annual eye exam?  Yes  Who is the provider or what is the name of the office in which the patient attends annual eye exams? Dr.Dingeldein If pt is not established with a provider, would they like to be referred to a provider to establish care? No .   Dental Screening: Recommended annual dental exams for proper oral hygiene  Diabetic Foot Exam: Diabetic Foot Exam: Overdue, Pt has been advised about the importance in completing this exam. Pt is scheduled for diabetic foot exam  on NO.  Community Resource Referral / Chronic Care Management: CRR required this visit?  No   CCM required this visit?  No     Plan:     I have personally reviewed and noted the following in the patient's chart:   Medical and social history Use of alcohol, tobacco or illicit drugs  Current medications and supplements including opioid prescriptions. Patient is not currently taking opioid prescriptions. Functional ability and status Nutritional status Physical activity Advanced directives List of other physicians Hospitalizations, surgeries, and ER visits in previous 12 months Vitals Screenings to include cognitive, depression, and falls Referrals and appointments  In addition, I have reviewed and discussed with patient certain preventive protocols, quality metrics, and best practice recommendations. A written personalized care plan for preventive services as well as general preventive health recommendations were provided to patient.     Hal Hope, LPN   1/61/0960   After Visit Summary: (MyChart) Due to this being a telephonic visit, the after visit summary with patients personalized plan was offered to patient via MyChart   Nurse Notes: none

## 2023-05-13 NOTE — Patient Instructions (Addendum)
Julia Nielsen , Thank you for taking time to come for your Medicare Wellness Visit. I appreciate your ongoing commitment to your health goals. Please review the following plan we discussed and let me know if I can assist you in the future.   Referrals/Orders/Follow-Ups/Clinician Recommendations: none  This is a list of the screening recommended for you and due dates:  Health Maintenance  Topic Date Due   Zoster (Shingles) Vaccine (1 of 2) Never done   COVID-19 Vaccine (4 - 2023-24 season) 06/14/2022   Complete foot exam   04/11/2023   Flu Shot  05/15/2023   Eye exam for diabetics  07/09/2023   Hemoglobin A1C  08/13/2023   Mammogram  09/17/2023   Yearly kidney function blood test for diabetes  11/14/2023   Yearly kidney health urinalysis for diabetes  11/14/2023   Medicare Annual Wellness Visit  05/12/2024   DTaP/Tdap/Td vaccine (3 - Tdap) 10/04/2030   Pneumonia Vaccine  Completed   DEXA scan (bone density measurement)  Completed   HPV Vaccine  Aged Out   Colon Cancer Screening  Discontinued    Advanced directives: (Declined) Advance directive discussed with you today. Even though you declined this today, please call our office should you change your mind, and we can give you the proper paperwork for you to fill out.  Next Medicare Annual Wellness Visit scheduled for next year: Yes   05/18/24 @ 9:30 am by phone  Preventive Care 65 Years and Older, Female Preventive care refers to lifestyle choices and visits with your health care provider that can promote health and wellness. What does preventive care include? A yearly physical exam. This is also called an annual well check. Dental exams once or twice a year. Routine eye exams. Ask your health care provider how often you should have your eyes checked. Personal lifestyle choices, including: Daily care of your teeth and gums. Regular physical activity. Eating a healthy diet. Avoiding tobacco and drug use. Limiting alcohol  use. Practicing safe sex. Taking low-dose aspirin every day. Taking vitamin and mineral supplements as recommended by your health care provider. What happens during an annual well check? The services and screenings done by your health care provider during your annual well check will depend on your age, overall health, lifestyle risk factors, and family history of disease. Counseling  Your health care provider may ask you questions about your: Alcohol use. Tobacco use. Drug use. Emotional well-being. Home and relationship well-being. Sexual activity. Eating habits. History of falls. Memory and ability to understand (cognition). Work and work Astronomer. Reproductive health. Screening  You may have the following tests or measurements: Height, weight, and BMI. Blood pressure. Lipid and cholesterol levels. These may be checked every 5 years, or more frequently if you are over 73 years old. Skin check. Lung cancer screening. You may have this screening every year starting at age 87 if you have a 30-pack-year history of smoking and currently smoke or have quit within the past 15 years. Fecal occult blood test (FOBT) of the stool. You may have this test every year starting at age 19. Flexible sigmoidoscopy or colonoscopy. You may have a sigmoidoscopy every 5 years or a colonoscopy every 10 years starting at age 9. Hepatitis C blood test. Hepatitis B blood test. Sexually transmitted disease (STD) testing. Diabetes screening. This is done by checking your blood sugar (glucose) after you have not eaten for a while (fasting). You may have this done every 1-3 years. Bone density scan. This is done  to screen for osteoporosis. You may have this done starting at age 80. Mammogram. This may be done every 1-2 years. Talk to your health care provider about how often you should have regular mammograms. Talk with your health care provider about your test results, treatment options, and if necessary,  the need for more tests. Vaccines  Your health care provider may recommend certain vaccines, such as: Influenza vaccine. This is recommended every year. Tetanus, diphtheria, and acellular pertussis (Tdap, Td) vaccine. You may need a Td booster every 10 years. Zoster vaccine. You may need this after age 65. Pneumococcal 13-valent conjugate (PCV13) vaccine. One dose is recommended after age 29. Pneumococcal polysaccharide (PPSV23) vaccine. One dose is recommended after age 6. Talk to your health care provider about which screenings and vaccines you need and how often you need them. This information is not intended to replace advice given to you by your health care provider. Make sure you discuss any questions you have with your health care provider. Document Released: 10/27/2015 Document Revised: 06/19/2016 Document Reviewed: 08/01/2015 Elsevier Interactive Patient Education  2017 [employer].  Fall Prevention in the Home Falls can cause injuries. They can happen to people of all ages. There are many things you can do to make your home safe and to help prevent falls. What can I do on the outside of my home? Regularly fix the edges of walkways and driveways and fix any cracks. Remove anything that might make you trip as you walk through a door, such as a raised step or threshold. Trim any bushes or trees on the path to your home. Use bright outdoor lighting. Clear any walking paths of anything that might make someone trip, such as rocks or tools. Regularly check to see if handrails are loose or broken. Make sure that both sides of any steps have handrails. Any raised decks and porches should have guardrails on the edges. Have any leaves, snow, or ice cleared regularly. Use sand or salt on walking paths during winter. Clean up any spills in your garage right away. This includes oil or grease spills. What can I do in the bathroom? Use night lights. Install grab bars by the toilet and in the  tub and shower. Do not use towel bars as grab bars. Use non-skid mats or decals in the tub or shower. If you need to sit down in the shower, use a plastic, non-slip stool. Keep the floor dry. Clean up any water that spills on the floor as soon as it happens. Remove soap buildup in the tub or shower regularly. Attach bath mats securely with double-sided non-slip rug tape. Do not have throw rugs and other things on the floor that can make you trip. What can I do in the bedroom? Use night lights. Make sure that you have a light by your bed that is easy to reach. Do not use any sheets or blankets that are too big for your bed. They should not hang down onto the floor. Have a firm chair that has side arms. You can use this for support while you get dressed. Do not have throw rugs and other things on the floor that can make you trip. What can I do in the kitchen? Clean up any spills right away. Avoid walking on wet floors. Keep items that you use a lot in easy-to-reach places. If you need to reach something above you, use a strong step stool that has a grab bar. Keep electrical cords out of the  way. Do not use floor polish or wax that makes floors slippery. If you must use wax, use non-skid floor wax. Do not have throw rugs and other things on the floor that can make you trip. What can I do with my stairs? Do not leave any items on the stairs. Make sure that there are handrails on both sides of the stairs and use them. Fix handrails that are broken or loose. Make sure that handrails are as long as the stairways. Check any carpeting to make sure that it is firmly attached to the stairs. Fix any carpet that is loose or worn. Avoid having throw rugs at the top or bottom of the stairs. If you do have throw rugs, attach them to the floor with carpet tape. Make sure that you have a light switch at the top of the stairs and the bottom of the stairs. If you do not have them, ask someone to add them for  you. What else can I do to help prevent falls? Wear shoes that: Do not have high heels. Have rubber bottoms. Are comfortable and fit you well. Are closed at the toe. Do not wear sandals. If you use a stepladder: Make sure that it is fully opened. Do not climb a closed stepladder. Make sure that both sides of the stepladder are locked into place. Ask someone to hold it for you, if possible. Clearly mark and make sure that you can see: Any grab bars or handrails. First and last steps. Where the edge of each step is. Use tools that help you move around (mobility aids) if they are needed. These include: Canes. Walkers. Scooters. Crutches. Turn on the lights when you go into a dark area. Replace any light bulbs as soon as they burn out. Set up your furniture so you have a clear path. Avoid moving your furniture around. If any of your floors are uneven, fix them. If there are any pets around you, be aware of where they are. Review your medicines with your doctor. Some medicines can make you feel dizzy. This can increase your chance of falling. Ask your doctor what other things that you can do to help prevent falls. This information is not intended to replace advice given to you by your health care provider. Make sure you discuss any questions you have with your health care provider. Document Released: 07/27/2009 Document Revised: 03/07/2016 Document Reviewed: 11/04/2014 Elsevier Interactive Patient Education  2017 [employer].

## 2023-05-16 ENCOUNTER — Ambulatory Visit (INDEPENDENT_AMBULATORY_CARE_PROVIDER_SITE_OTHER): Payer: PPO | Admitting: Family Medicine

## 2023-05-16 ENCOUNTER — Encounter: Payer: Self-pay | Admitting: Family Medicine

## 2023-05-16 VITALS — BP 134/74 | HR 70 | Temp 97.9°F | Wt 169.9 lb

## 2023-05-16 DIAGNOSIS — I2510 Atherosclerotic heart disease of native coronary artery without angina pectoris: Secondary | ICD-10-CM

## 2023-05-16 DIAGNOSIS — N183 Chronic kidney disease, stage 3 unspecified: Secondary | ICD-10-CM

## 2023-05-16 DIAGNOSIS — Z7985 Long-term (current) use of injectable non-insulin antidiabetic drugs: Secondary | ICD-10-CM

## 2023-05-16 DIAGNOSIS — R5382 Chronic fatigue, unspecified: Secondary | ICD-10-CM | POA: Diagnosis not present

## 2023-05-16 DIAGNOSIS — I700 Atherosclerosis of aorta: Secondary | ICD-10-CM

## 2023-05-16 DIAGNOSIS — E039 Hypothyroidism, unspecified: Secondary | ICD-10-CM

## 2023-05-16 DIAGNOSIS — Z1231 Encounter for screening mammogram for malignant neoplasm of breast: Secondary | ICD-10-CM | POA: Diagnosis not present

## 2023-05-16 DIAGNOSIS — E785 Hyperlipidemia, unspecified: Secondary | ICD-10-CM | POA: Diagnosis not present

## 2023-05-16 DIAGNOSIS — E1169 Type 2 diabetes mellitus with other specified complication: Secondary | ICD-10-CM | POA: Diagnosis not present

## 2023-05-16 DIAGNOSIS — D693 Immune thrombocytopenic purpura: Secondary | ICD-10-CM

## 2023-05-16 DIAGNOSIS — E1122 Type 2 diabetes mellitus with diabetic chronic kidney disease: Secondary | ICD-10-CM | POA: Diagnosis not present

## 2023-05-16 LAB — BAYER DCA HB A1C WAIVED: HB A1C (BAYER DCA - WAIVED): 6.7 % — ABNORMAL HIGH (ref 4.8–5.6)

## 2023-05-16 MED ORDER — BENAZEPRIL HCL 40 MG PO TABS: 40.0000 mg | ORAL_TABLET | Freq: Every day | ORAL | 1 refills | Status: DC

## 2023-05-16 MED ORDER — FUROSEMIDE 20 MG PO TABS: 20.0000 mg | ORAL_TABLET | Freq: Every day | ORAL | 3 refills | Status: DC

## 2023-05-16 MED ORDER — AMLODIPINE BESYLATE 10 MG PO TABS: 10.0000 mg | ORAL_TABLET | Freq: Every morning | ORAL | 1 refills | Status: DC

## 2023-05-16 MED ORDER — CARVEDILOL 25 MG PO TABS
25.0000 mg | ORAL_TABLET | Freq: Two times a day (BID) | ORAL | 1 refills | Status: DC
Start: 2023-05-16 — End: 2023-11-02

## 2023-05-16 MED ORDER — GABAPENTIN 100 MG PO CAPS: 100.0000 mg | ORAL_CAPSULE | Freq: Every evening | ORAL | 1 refills | Status: DC | PRN

## 2023-05-16 MED ORDER — CLONIDINE HCL 0.1 MG PO TABS: ORAL_TABLET | ORAL | 1 refills | Status: DC

## 2023-05-16 MED ORDER — LOVASTATIN 40 MG PO TABS: 40.0000 mg | ORAL_TABLET | Freq: Every day | ORAL | 1 refills | Status: DC

## 2023-05-16 NOTE — Assessment & Plan Note (Signed)
Doing well with A1c of 6.7. Continue current regimen. Call with any concerns. Call with any concerns.

## 2023-05-16 NOTE — Progress Notes (Signed)
BP 134/74   Pulse 70   Temp 97.9 F (36.6 C) (Oral)   Wt 169 lb 14.4 oz (77.1 kg)   SpO2 99%   BMI 31.58 kg/m    Subjective:    Patient ID: Julia Nielsen, female    DOB: [birth year], 82 y.o.   MRN: [id number]  HPI: Julia Nielsen is a 82 y.o. female  Chief Complaint  Patient presents with   Diabetes   DIABETES Hypoglycemic episodes:no Polydipsia/polyuria: no Visual disturbance: no Chest pain: no Paresthesias: no Glucose Monitoring: yes  Accucheck frequency: Daily Taking Insulin?: no Blood Pressure Monitoring: not checking Retinal Examination: Up to Date Foot Exam: Up to Date Diabetic Education: Completed Pneumovax: Up to Date Influenza: will get in the fall Aspirin: no  HYPERTENSION / HYPERLIPIDEMIA Satisfied with current treatment? no Duration of hypertension: years BP monitoring frequency: not checking BP medication side effects: no Past BP meds: benazepril, amlodipine, carvedilol, clonidine, lasix Duration of hyperlipidemia: chronic Cholesterol medication side effects: no Cholesterol supplements: none Past cholesterol medications: lovastatin Medication compliance: excellent compliance Aspirin: no Recent stressors: no Recurrent headaches: no Visual changes: no Palpitations: no Dyspnea: no Chest pain: no Lower extremity edema: no Dizzy/lightheaded: no  HYPOTHYROIDISM Thyroid control status:controlled Satisfied with current treatment? yes Medication side effects: no Medication compliance: excellent compliance Recent dose adjustment:no Fatigue: yes Cold intolerance: no Heat intolerance: no Weight gain: no Weight loss: no Constipation: no Diarrhea/loose stools: no Palpitations: no Lower extremity edema: no Anxiety/depressed mood: no   Relevant past medical, surgical, family and social history reviewed and updated as indicated. Interim medical history since our last visit reviewed. Allergies and medications reviewed and updated.  Review of  Systems  Constitutional:  Positive for fatigue. Negative for activity change, appetite change, chills, diaphoresis, fever and unexpected weight change.  Respiratory: Negative.    Cardiovascular: Negative.   Gastrointestinal: Negative.   Musculoskeletal: Negative.   Psychiatric/Behavioral: Negative.      Per HPI unless specifically indicated above     Objective:    BP 134/74   Pulse 70   Temp 97.9 F (36.6 C) (Oral)   Wt 169 lb 14.4 oz (77.1 kg)   SpO2 99%   BMI 31.58 kg/m   Wt Readings from Last 3 Encounters:  05/16/23 169 lb 14.4 oz (77.1 kg)  05/13/23 169 lb (76.7 kg)  02/11/23 169 lb 8 oz (76.9 kg)    Physical Exam Vitals and nursing note reviewed.  Constitutional:      General: She is not in acute distress.    Appearance: Normal appearance. She is obese. She is not ill-appearing, toxic-appearing or diaphoretic.  HENT:     Head: Normocephalic and atraumatic.     Right Ear: External ear normal.     Left Ear: External ear normal.     Nose: Nose normal.     Mouth/Throat:     Mouth: Mucous membranes are moist.     Pharynx: Oropharynx is clear.  Eyes:     General: No scleral icterus.       Right eye: No discharge.        Left eye: No discharge.     Extraocular Movements: Extraocular movements intact.     Conjunctiva/sclera: Conjunctivae normal.     Pupils: Pupils are equal, round, and reactive to light.  Cardiovascular:     Rate and Rhythm: Normal rate and regular rhythm.     Pulses: Normal pulses.     Heart sounds: Normal heart sounds. No murmur heard.  No friction rub. No gallop.  Pulmonary:     Effort: Pulmonary effort is normal. No respiratory distress.     Breath sounds: Normal breath sounds. No stridor. No wheezing, rhonchi or rales.  Chest:     Chest wall: No tenderness.  Musculoskeletal:        General: Normal range of motion.     Cervical back: Normal range of motion and neck supple.  Skin:    General: Skin is warm and dry.     Capillary Refill:  Capillary refill takes less than 2 seconds.     Coloration: Skin is not jaundiced or pale.     Findings: No bruising, erythema, lesion or rash.  Neurological:     General: No focal deficit present.     Mental Status: She is alert and oriented to person, place, and time. Mental status is at baseline.  Psychiatric:        Mood and Affect: Mood normal.        Behavior: Behavior normal.        Thought Content: Thought content normal.        Judgment: Judgment normal.     Results for orders placed or performed in visit on 05/16/23  Bayer DCA Hb A1c Waived  Result Value Ref Range   HB A1C (BAYER DCA - WAIVED) 6.7 (H) 4.8 - 5.6 %      Assessment & Plan:   Problem List Items Addressed This Visit       Cardiovascular and Mediastinum   CAD (coronary artery disease)    Will keep BP and cholesterol under good control. Continue to monitor. Call with any concerns.       Relevant Medications   amLODipine (NORVASC) 10 MG tablet   benazepril (LOTENSIN) 40 MG tablet   carvedilol (COREG) 25 MG tablet   cloNIDine (CATAPRES) 0.1 MG tablet   furosemide (LASIX) 20 MG tablet   lovastatin (MEVACOR) 40 MG tablet   Atherosclerosis of aorta (HCC)    Will keep BP and cholesterol under good control. Continue to monitor. Call with any concerns.       Relevant Medications   amLODipine (NORVASC) 10 MG tablet   benazepril (LOTENSIN) 40 MG tablet   carvedilol (COREG) 25 MG tablet   cloNIDine (CATAPRES) 0.1 MG tablet   furosemide (LASIX) 20 MG tablet   lovastatin (MEVACOR) 40 MG tablet     Endocrine   Hyperlipidemia associated with type 2 diabetes mellitus (HCC)    Under good control on current regimen. Continue current regimen. Continue to monitor. Call with any concerns. Refills given. Labs drawn today.        Relevant Medications   amLODipine (NORVASC) 10 MG tablet   benazepril (LOTENSIN) 40 MG tablet   carvedilol (COREG) 25 MG tablet   cloNIDine (CATAPRES) 0.1 MG tablet   furosemide  (LASIX) 20 MG tablet   lovastatin (MEVACOR) 40 MG tablet   Type 2 diabetes mellitus with stage 3 chronic kidney disease (HCC) - Primary    Doing well with A1c of 6.7. Continue current regimen. Call with any concerns. Call with any concerns.       Relevant Medications   benazepril (LOTENSIN) 40 MG tablet   lovastatin (MEVACOR) 40 MG tablet   Other Relevant Orders   Bayer DCA Hb A1c Waived (Completed)   CBC with Differential/Platelet   Lipid Panel w/o Chol/HDL Ratio   Comprehensive metabolic panel   D66   Hypothyroidism    Rechecking labs today. Await  results. Treat as needed.       Relevant Medications   carvedilol (COREG) 25 MG tablet   Other Relevant Orders   TSH     Hematopoietic and Hemostatic   Idiopathic thrombocytopenia (HCC)    Rechecking labs today. Await results. Treat as needed.       Other Visit Diagnoses     Chronic fatigue       Checking labs today. Await results. Treat as needed.   Relevant Orders   B12   Encounter for screening mammogram for malignant neoplasm of breast       Mammo ordered today   Relevant Orders   MM 3D SCREENING MAMMOGRAM BILATERAL BREAST        Follow up plan: Return in about 3 months (around 08/16/2023).

## 2023-05-16 NOTE — Assessment & Plan Note (Signed)
Under good control on current regimen. Continue current regimen. Continue to monitor. Call with any concerns. Refills given. Labs drawn today.   

## 2023-05-16 NOTE — Assessment & Plan Note (Signed)
Will keep BP and cholesterol under good control. Continue to monitor. Call with any concerns.  

## 2023-05-16 NOTE — Assessment & Plan Note (Signed)
Rechecking labs today. Await results. Treat as needed.  °

## 2023-05-17 ENCOUNTER — Encounter: Payer: Self-pay | Admitting: Family Medicine

## 2023-05-17 ENCOUNTER — Other Ambulatory Visit: Payer: Self-pay | Admitting: Family Medicine

## 2023-05-17 DIAGNOSIS — E039 Hypothyroidism, unspecified: Secondary | ICD-10-CM

## 2023-05-17 MED ORDER — LEVOTHYROXINE SODIUM 150 MCG PO TABS
150.0000 ug | ORAL_TABLET | Freq: Every day | ORAL | 0 refills | Status: DC
Start: 2023-05-17 — End: 2023-08-20

## 2023-07-15 ENCOUNTER — Other Ambulatory Visit: Payer: Self-pay | Admitting: Family Medicine

## 2023-07-15 DIAGNOSIS — N183 Type 2 diabetes mellitus with diabetic chronic kidney disease: Secondary | ICD-10-CM

## 2023-07-15 NOTE — Telephone Encounter (Signed)
Requested Prescriptions  Pending Prescriptions Disp Refills   glucose blood (ONETOUCH ULTRA) test strip [Pharmacy Med Name: OneTouch Ultra Blue In Vitro Strip] 100 each 1    Sig: USE TO CHECK BLOOD SUGAR AS NEEDED AS DIRECTED     Endocrinology: Diabetes - Testing Supplies Passed - 07/15/2023 11:37 AM      Passed - Valid encounter within last 12 months    Recent Outpatient Visits           2 months ago Type 2 diabetes mellitus with stage 3 chronic kidney disease, without long-term current use of insulin, unspecified whether stage 3a or 3b CKD (HCC)   [place] [facility] [place], Megan P, DO   5 months ago Type 2 diabetes mellitus with stage 3 chronic kidney disease, without long-term current use of insulin, unspecified whether stage 3a or 3b CKD (HCC)   [place] [facility] [place], Megan P, DO   8 months ago Routine general medical examination at a health care facility   [facility] [place] P, DO   11 months ago Type 2 diabetes mellitus with stage 3 chronic kidney disease, without long-term current use of insulin, unspecified whether stage 3a or 3b CKD (HCC)   [place] [facility] [place], [place], DO   1 year ago Neuropathy   [place] [facility] [place], Oralia Rud, DO       Future Appointments             In 1 month Johnson, Oralia Rud, DO [place] [facility]

## 2023-08-13 DIAGNOSIS — R600 Localized edema: Secondary | ICD-10-CM | POA: Diagnosis not present

## 2023-08-13 DIAGNOSIS — N2889 Other specified disorders of kidney and ureter: Secondary | ICD-10-CM | POA: Diagnosis not present

## 2023-08-13 DIAGNOSIS — N281 Cyst of kidney, acquired: Secondary | ICD-10-CM | POA: Diagnosis not present

## 2023-08-13 DIAGNOSIS — N2581 Secondary hyperparathyroidism of renal origin: Secondary | ICD-10-CM | POA: Diagnosis not present

## 2023-08-13 DIAGNOSIS — I129 Hypertensive chronic kidney disease with stage 1 through stage 4 chronic kidney disease, or unspecified chronic kidney disease: Secondary | ICD-10-CM | POA: Diagnosis not present

## 2023-08-13 DIAGNOSIS — I10 Essential (primary) hypertension: Secondary | ICD-10-CM | POA: Diagnosis not present

## 2023-08-13 DIAGNOSIS — D631 Anemia in chronic kidney disease: Secondary | ICD-10-CM | POA: Diagnosis not present

## 2023-08-13 DIAGNOSIS — N1832 Chronic kidney disease, stage 3b: Secondary | ICD-10-CM | POA: Diagnosis not present

## 2023-08-13 DIAGNOSIS — E876 Hypokalemia: Secondary | ICD-10-CM | POA: Diagnosis not present

## 2023-08-13 DIAGNOSIS — R808 Other proteinuria: Secondary | ICD-10-CM | POA: Diagnosis not present

## 2023-08-13 DIAGNOSIS — E1122 Type 2 diabetes mellitus with diabetic chronic kidney disease: Secondary | ICD-10-CM | POA: Diagnosis not present

## 2023-08-13 DIAGNOSIS — R809 Proteinuria, unspecified: Secondary | ICD-10-CM | POA: Diagnosis not present

## 2023-08-19 ENCOUNTER — Encounter: Payer: Self-pay | Admitting: Family Medicine

## 2023-08-19 ENCOUNTER — Ambulatory Visit (INDEPENDENT_AMBULATORY_CARE_PROVIDER_SITE_OTHER): Payer: PPO | Admitting: Family Medicine

## 2023-08-19 VITALS — BP 117/70 | HR 80 | Ht 61.5 in | Wt 175.4 lb

## 2023-08-19 DIAGNOSIS — E039 Hypothyroidism, unspecified: Secondary | ICD-10-CM | POA: Diagnosis not present

## 2023-08-19 DIAGNOSIS — E1122 Type 2 diabetes mellitus with diabetic chronic kidney disease: Secondary | ICD-10-CM

## 2023-08-19 DIAGNOSIS — N183 Chronic kidney disease, stage 3 unspecified: Secondary | ICD-10-CM

## 2023-08-19 DIAGNOSIS — N1832 Chronic kidney disease, stage 3b: Secondary | ICD-10-CM

## 2023-08-19 DIAGNOSIS — Z7984 Long term (current) use of oral hypoglycemic drugs: Secondary | ICD-10-CM | POA: Diagnosis not present

## 2023-08-19 LAB — BAYER DCA HB A1C WAIVED: HB A1C (BAYER DCA - WAIVED): 6.5 % — ABNORMAL HIGH (ref 4.8–5.6)

## 2023-08-19 NOTE — Assessment & Plan Note (Signed)
Rechecking labs today. Await results. Treat as needed.  °

## 2023-08-19 NOTE — Assessment & Plan Note (Signed)
Continue to follow with nephrology. Call with any concerns. Continue to monitor.  

## 2023-08-19 NOTE — Progress Notes (Signed)
BP 117/70   Pulse 80   Ht 5' 1.5" (1.562 m)   Wt 175 lb 6.4 oz (79.6 kg)   SpO2 98%   BMI 32.60 kg/m    Subjective:    Patient ID: Julia Nielsen, female    DOB: 09-21-1941, 82 y.o.   MRN: [id number]  HPI: Julia Nielsen is a 82 y.o. female  Chief Complaint  Patient presents with   Diabetes    Most recent Diabetic Eye Exam was requested at today's visit.    DIABETES Hypoglycemic episodes:no Polydipsia/polyuria: no Visual disturbance: no Chest pain: no Paresthesias: no Glucose Monitoring: yes  Accucheck frequency: Daily - sometimes up to 200 Taking Insulin?: no Blood Pressure Monitoring: not checking Retinal Examination: Up to Date Foot Exam: Up to Date Diabetic Education: Completed Pneumovax: Up to Date Influenza: Up to Date Aspirin: yes  Relevant past medical, surgical, family and social history reviewed and updated as indicated. Interim medical history since our last visit reviewed. Allergies and medications reviewed and updated.  Review of Systems  Constitutional: Negative.   Respiratory: Negative.    Cardiovascular:  Positive for leg swelling. Negative for chest pain and palpitations.  Gastrointestinal: Negative.   Musculoskeletal: Negative.   Psychiatric/Behavioral: Negative.      Per HPI unless specifically indicated above     Objective:    BP 117/70   Pulse 80   Ht 5' 1.5" (1.562 m)   Wt 175 lb 6.4 oz (79.6 kg)   SpO2 98%   BMI 32.60 kg/m   Wt Readings from Last 3 Encounters:  08/19/23 175 lb 6.4 oz (79.6 kg)  05/16/23 169 lb 14.4 oz (77.1 kg)  05/13/23 169 lb (76.7 kg)    Physical Exam Vitals and nursing note reviewed.  Constitutional:      General: She is not in acute distress.    Appearance: Normal appearance. She is not ill-appearing, toxic-appearing or diaphoretic.  HENT:     Head: Normocephalic and atraumatic.     Right Ear: External ear normal.     Left Ear: External ear normal.     Nose: Nose normal.     Mouth/Throat:      Mouth: Mucous membranes are moist.     Pharynx: Oropharynx is clear.  Eyes:     General: No scleral icterus.       Right eye: No discharge.        Left eye: No discharge.     Extraocular Movements: Extraocular movements intact.     Conjunctiva/sclera: Conjunctivae normal.     Pupils: Pupils are equal, round, and reactive to light.  Cardiovascular:     Rate and Rhythm: Normal rate and regular rhythm.     Pulses: Normal pulses.     Heart sounds: Normal heart sounds. No murmur heard.    No friction rub. No gallop.  Pulmonary:     Effort: Pulmonary effort is normal. No respiratory distress.     Breath sounds: Normal breath sounds. No stridor. No wheezing, rhonchi or rales.  Chest:     Chest wall: No tenderness.  Musculoskeletal:        General: Normal range of motion.     Cervical back: Normal range of motion and neck supple.     Right lower leg: Edema present.     Left lower leg: Edema present.  Skin:    General: Skin is warm and dry.     Capillary Refill: Capillary refill takes less than 2 seconds.  Coloration: Skin is not jaundiced or pale.     Findings: No bruising, erythema, lesion or rash.  Neurological:     General: No focal deficit present.     Mental Status: She is alert and oriented to person, place, and time. Mental status is at baseline.  Psychiatric:        Mood and Affect: Mood normal.        Behavior: Behavior normal.        Thought Content: Thought content normal.        Judgment: Judgment normal.     Results for orders placed or performed in visit on 05/16/23  Bayer DCA Hb A1c Waived  Result Value Ref Range   HB A1C (BAYER DCA - WAIVED) 6.7 (H) 4.8 - 5.6 %  CBC with Differential/Platelet  Result Value Ref Range   WBC 3.6 3.4 - 10.8 x10E3/uL   RBC 4.30 3.77 - 5.28 x10E6/uL   Hemoglobin 12.3 11.1 - 15.9 g/dL   Hematocrit 96.2 95.2 - 46.6 %   MCV 90 79 - 97 fL   MCH 28.6 26.6 - 33.0 pg   MCHC 31.8 31.5 - 35.7 g/dL   RDW 84.1 32.4 - 40.1 %   Platelets  70 (LL) 150 - 450 x10E3/uL   Neutrophils 57 Not Estab. %   Lymphs 31 Not Estab. %   Monocytes 9 Not Estab. %   Eos 2 Not Estab. %   Basos 1 Not Estab. %   Neutrophils Absolute 2.0 1.4 - 7.0 x10E3/uL   Lymphocytes Absolute 1.1 0.7 - 3.1 x10E3/uL   Monocytes Absolute 0.3 0.1 - 0.9 x10E3/uL   EOS (ABSOLUTE) 0.1 0.0 - 0.4 x10E3/uL   Basophils Absolute 0.0 0.0 - 0.2 x10E3/uL   Immature Granulocytes 0 Not Estab. %   Immature Grans (Abs) 0.0 0.0 - 0.1 x10E3/uL   Hematology Comments: Note:   Lipid Panel w/o Chol/HDL Ratio  Result Value Ref Range   Cholesterol, Total 127 100 - 199 mg/dL   Triglycerides 027 0 - 149 mg/dL   HDL 53 >25 mg/dL   VLDL Cholesterol Cal 21 5 - 40 mg/dL   LDL Chol Calc (NIH) 53 0 - 99 mg/dL  Comprehensive metabolic panel  Result Value Ref Range   Glucose 169 (H) 70 - 99 mg/dL   BUN 23 8 - 27 mg/dL   Creatinine, Ser 3.66 (H) 0.57 - 1.00 mg/dL   eGFR 41 (L) >44 IH/KVQ/2.59   BUN/Creatinine Ratio 18 12 - 28   Sodium 141 134 - 144 mmol/L   Potassium 4.2 3.5 - 5.2 mmol/L   Chloride 107 (H) 96 - 106 mmol/L   CO2 20 20 - 29 mmol/L   Calcium 9.4 8.7 - 10.3 mg/dL   Total Protein 6.3 6.0 - 8.5 g/dL   Albumin 3.9 3.7 - 4.7 g/dL   Globulin, Total 2.4 1.5 - 4.5 g/dL   Bilirubin Total 0.7 0.0 - 1.2 mg/dL   Alkaline Phosphatase 56 44 - 121 IU/L   AST 21 0 - 40 IU/L   ALT 20 0 - 32 IU/L  TSH  Result Value Ref Range   TSH 0.281 (L) 0.450 - 4.500 uIU/mL  Vitamin B12  Result Value Ref Range   Vitamin B-12 446 232 - 1,245 pg/mL      Assessment & Plan:   Problem List Items Addressed This Visit       Endocrine   Type 2 diabetes mellitus with stage 3 chronic kidney disease (HCC) - Primary  Doing well with A1c of 6.5. Continue current regimen. Call with any concerns. Continue to monitor.       Relevant Orders   Bayer DCA Hb A1c Waived   Hypothyroidism    Rechecking labs today. Await results. Treat as needed.         Genitourinary   CKD (chronic kidney  disease) stage 3, GFR 30-59 ml/min (HCC)    Continue to follow with nephrology. Call with any concerns. Continue to monitor.         Follow up plan: Return in about 3 months (around 11/19/2023).

## 2023-08-19 NOTE — Assessment & Plan Note (Signed)
Doing well with A1c of 6.5. Continue current regimen. Call with any concerns. Continue to monitor.

## 2023-08-20 ENCOUNTER — Other Ambulatory Visit: Payer: Self-pay | Admitting: Family Medicine

## 2023-08-20 ENCOUNTER — Encounter: Payer: Self-pay | Admitting: Family Medicine

## 2023-08-20 DIAGNOSIS — E039 Hypothyroidism, unspecified: Secondary | ICD-10-CM

## 2023-08-20 LAB — TSH: TSH: 2.96 u[IU]/mL (ref 0.450–4.500)

## 2023-08-20 MED ORDER — LEVOTHYROXINE SODIUM 150 MCG PO TABS: 150.0000 ug | ORAL_TABLET | Freq: Every day | ORAL | 3 refills | Status: AC

## 2023-08-21 DIAGNOSIS — R809 Proteinuria, unspecified: Secondary | ICD-10-CM | POA: Diagnosis not present

## 2023-08-21 DIAGNOSIS — E1122 Type 2 diabetes mellitus with diabetic chronic kidney disease: Secondary | ICD-10-CM | POA: Diagnosis not present

## 2023-08-21 DIAGNOSIS — D631 Anemia in chronic kidney disease: Secondary | ICD-10-CM | POA: Diagnosis not present

## 2023-08-21 DIAGNOSIS — N1832 Chronic kidney disease, stage 3b: Secondary | ICD-10-CM | POA: Diagnosis not present

## 2023-08-21 DIAGNOSIS — I129 Hypertensive chronic kidney disease with stage 1 through stage 4 chronic kidney disease, or unspecified chronic kidney disease: Secondary | ICD-10-CM | POA: Diagnosis not present

## 2023-08-21 DIAGNOSIS — N2581 Secondary hyperparathyroidism of renal origin: Secondary | ICD-10-CM | POA: Diagnosis not present

## 2023-08-21 NOTE — Progress Notes (Signed)
Printed and mailed

## 2023-08-22 ENCOUNTER — Encounter: Payer: Self-pay | Admitting: Family Medicine

## 2023-09-02 ENCOUNTER — Ambulatory Visit (INDEPENDENT_AMBULATORY_CARE_PROVIDER_SITE_OTHER): Payer: PPO | Admitting: Nurse Practitioner

## 2023-09-02 ENCOUNTER — Encounter: Payer: Self-pay | Admitting: Nurse Practitioner

## 2023-09-02 VITALS — BP 149/69 | HR 74 | Temp 97.7°F | Ht 61.5 in | Wt 169.4 lb

## 2023-09-02 DIAGNOSIS — H6993 Unspecified Eustachian tube disorder, bilateral: Secondary | ICD-10-CM | POA: Diagnosis not present

## 2023-09-02 MED ORDER — MONTELUKAST SODIUM 10 MG PO TABS: 10.0000 mg | ORAL_TABLET | Freq: Every day | ORAL | 0 refills | Status: DC

## 2023-09-02 NOTE — Progress Notes (Signed)
BP (!) 149/69 (BP Location: Left Arm, Patient Position: Sitting, Cuff Size: Large)   Pulse 74   Temp 97.7 F (36.5 C) (Oral)   Ht 5' 1.5" (1.562 m)   Wt 169 lb 6.4 oz (76.8 kg)   SpO2 97%   BMI 31.49 kg/m    Subjective:    Patient ID: Julia Nielsen, female    DOB: 28-Oct-1940, 82 y.o.   MRN: [id number]  HPI: Julia Nielsen is a 82 y.o. female  Chief Complaint  Patient presents with   ear discomfort    Bilateral ears are giving off a frequency that is unbearable, possible ear infection    EAR PAIN Duration:  started a couple of weeks ago Involved ear(s): bilateral Severity:  moderate  Quality:   loud noise and dull ache Fever: no Otorrhea: no Upper respiratory infection symptoms: no Pruritus: no Hearing loss: no Water immersion no Using Q-tips: no Recurrent otitis media: no Status: worse Treatments attempted: none She has taken mucinex and allegra have not helped her symptoms.  Relevant past medical, surgical, family and social history reviewed and updated as indicated. Interim medical history since our last visit reviewed. Allergies and medications reviewed and updated.  Review of Systems  HENT:  Positive for ear pain. Negative for congestion.     Per HPI unless specifically indicated above     Objective:    BP (!) 149/69 (BP Location: Left Arm, Patient Position: Sitting, Cuff Size: Large)   Pulse 74   Temp 97.7 F (36.5 C) (Oral)   Ht 5' 1.5" (1.562 m)   Wt 169 lb 6.4 oz (76.8 kg)   SpO2 97%   BMI 31.49 kg/m   Wt Readings from Last 3 Encounters:  09/02/23 169 lb 6.4 oz (76.8 kg)  08/19/23 175 lb 6.4 oz (79.6 kg)  05/16/23 169 lb 14.4 oz (77.1 kg)    Physical Exam Vitals and nursing note reviewed.  Constitutional:      General: She is not in acute distress.    Appearance: Normal appearance. She is normal weight. She is not ill-appearing, toxic-appearing or diaphoretic.  HENT:     Head: Normocephalic.     Right Ear: External ear normal. No  tenderness. A middle ear effusion is present. Tympanic membrane is not erythematous.     Left Ear: External ear normal. No tenderness. A middle ear effusion is present. Tympanic membrane is not erythematous.     Nose: Nose normal.     Mouth/Throat:     Mouth: Mucous membranes are moist.     Pharynx: Oropharynx is clear.  Eyes:     General:        Right eye: No discharge.        Left eye: No discharge.     Extraocular Movements: Extraocular movements intact.     Conjunctiva/sclera: Conjunctivae normal.     Pupils: Pupils are equal, round, and reactive to light.  Cardiovascular:     Rate and Rhythm: Normal rate and regular rhythm.     Heart sounds: No murmur heard. Pulmonary:     Effort: Pulmonary effort is normal. No respiratory distress.     Breath sounds: Normal breath sounds. No wheezing or rales.  Musculoskeletal:     Cervical back: Normal range of motion and neck supple.  Skin:    General: Skin is warm and dry.     Capillary Refill: Capillary refill takes less than 2 seconds.  Neurological:     General: No focal  deficit present.     Mental Status: She is alert and oriented to person, place, and time. Mental status is at baseline.  Psychiatric:        Mood and Affect: Mood normal.        Behavior: Behavior normal.        Thought Content: Thought content normal.        Judgment: Judgment normal.     Results for orders placed or performed in visit on 08/19/23  Bayer DCA Hb A1c Waived  Result Value Ref Range   HB A1C (BAYER DCA - WAIVED) 6.5 (H) 4.8 - 5.6 %  TSH  Result Value Ref Range   TSH 2.960 0.450 - 4.500 uIU/mL      Assessment & Plan:   Problem List Items Addressed This Visit   None Visit Diagnoses     Dysfunction of both eustachian tubes    -  Primary   Continue with OTC antihistamine. Okay to change to zyrtec.  Will send Singulair for two weeks.  Okay to give triamcinalone if symptoms not improved.        Follow up plan: Return if symptoms worsen or  fail to improve.

## 2023-09-17 ENCOUNTER — Other Ambulatory Visit: Payer: Self-pay | Admitting: Family Medicine

## 2023-09-18 ENCOUNTER — Ambulatory Visit
Admission: RE | Admit: 2023-09-18 | Discharge: 2023-09-18 | Disposition: A | Discharge status: Home / Self Care | Payer: PPO | Source: Ambulatory Visit | Attending: Family Medicine | Admitting: Family Medicine

## 2023-09-18 DIAGNOSIS — Z1231 Encounter for screening mammogram for malignant neoplasm of breast: Secondary | ICD-10-CM | POA: Insufficient documentation

## 2023-09-19 ENCOUNTER — Encounter: Payer: Self-pay | Admitting: Family Medicine

## 2023-09-19 NOTE — Progress Notes (Signed)
Printed and mailed to patient.

## 2023-09-19 NOTE — Telephone Encounter (Signed)
Requested medications are due for refill today.  yes  Requested medications are on the active medications list.  yes  Last refill. 04/10/2022 6mL 1 rf  Future visit scheduled.   yes  Notes to clinic.  Medication is historical.    Requested Prescriptions  Pending Prescriptions Disp Refills   TRULICITY 1.5 MG/0.5ML SOAJ [Pharmacy Med Name: Trulicity Subcutaneous Solution Auto-injector 1.5 MG/0.5ML] 6 mL 0    Sig: INJECT 1.5 MG (0.5ML) UNDER THE SKIN ONCE A WEEK     Endocrinology:  Diabetes - GLP-1 Receptor Agonists Passed - 09/17/2023  8:01 AM      Passed - HBA1C is between 0 and 7.9 and within 180 days    HB A1C (BAYER DCA - WAIVED)  Date Value Ref Range Status  08/19/2023 6.5 (H) 4.8 - 5.6 % Final    Comment:             Prediabetes: 5.7 - 6.4          Diabetes: >6.4          Glycemic control for adults with diabetes: <7.0          Passed - Valid encounter within last 6 months    Recent Outpatient Visits           2 weeks ago Dysfunction of both eustachian tubes   [place] [facility] Larae Grooms, NP   1 month ago Type 2 diabetes mellitus with stage 3 chronic kidney disease, without long-term current use of insulin, unspecified whether stage 3a or 3b CKD (HCC)   [place] [facility] [place], Megan P, DO   4 months ago Type 2 diabetes mellitus with stage 3 chronic kidney disease, without long-term current use of insulin, unspecified whether stage 3a or 3b CKD (HCC)   [place] [facility] [place], Megan P, DO   7 months ago Type 2 diabetes mellitus with stage 3 chronic kidney disease, without long-term current use of insulin, unspecified whether stage 3a or 3b CKD (HCC)   [place] [facility] [place], Megan P, DO   10 months ago Routine general medical examination at a health care facility   [facility] Dorcas Carrow, DO       Future Appointments             In 2 months  Dor[place] Carrow, DO [place] [facility]

## 2023-09-19 NOTE — Progress Notes (Signed)
Printed letter and mailed on 09/19/2023.

## 2023-09-30 ENCOUNTER — Telehealth: Payer: Self-pay | Admitting: Family Medicine

## 2023-09-30 NOTE — Telephone Encounter (Signed)
Pt came into the office and stated that she will need her Trulicity before the end of this month and was requesting a Solicitor.  Let her know that Pharmacist will be in the office on Thursday.  Please advise.

## 2023-09-30 NOTE — Progress Notes (Signed)
   09/30/2023  Patient ID: Julia Nielsen, female   DOB: 1941/08/04, 82 y.o.   MRN: [id number]  Clinic routed request stating patient came by needing assistance with [facility] 2025 re-enrollment for her Trulicity 1.5mg  weekly.  I contacted the patient to verify information and will submit the application online.  Lenna Gilford, PharmD, DPLA

## 2023-10-01 NOTE — Telephone Encounter (Signed)
Spoke with Elnita Maxwell and she stated that she spoke with patient on the phone yesterday.  She has the patient on her schedule to do her application today online.

## 2023-10-07 ENCOUNTER — Other Ambulatory Visit: Payer: Self-pay

## 2023-10-07 NOTE — Progress Notes (Signed)
   10/07/2023  Patient ID: Julia Nielsen, female   DOB: 12/13/1940, 82 y.o.   MRN: [id number]  Submitted both patient and provider portion of [employer] 2025 re-enrollment for Trulicity 1.5mg  weekly online.  Dr. Laural Benes has been emailed a link to her [facility] email address to follow to e-sign the provider portion of the application.  Prescription pending for Dr. Laural Benes to sign to go to the program versus faxing new order.  Lenna Gilford, PharmD, DPLA

## 2023-10-09 MED ORDER — TRULICITY 1.5 MG/0.5ML ~~LOC~~ SOAJ: 1.5000 mg | SUBCUTANEOUS | 4 refills | Status: DC

## 2023-10-13 NOTE — Progress Notes (Signed)
   10/13/2023  Patient ID: Julia Nielsen, female   DOB: Mar 18, 1941, 82 y.o.   MRN: [id number]  Provider portion of [employer] 2025 re-enrollment for Trulicity PAP has been completed, but patient portion could not be submitted online for some reason.  [employer] representative suggested patient portion of application be submitted via paper application and sent into program.  Contacted patient to inform I am pre-filling her information and mailing application for her to sign and return to [facility] to my attention.  Lenna Gilford, PharmD, DPLA

## 2023-10-21 ENCOUNTER — Other Ambulatory Visit: Payer: Self-pay

## 2023-10-24 ENCOUNTER — Other Ambulatory Visit: Payer: Self-pay

## 2023-10-24 ENCOUNTER — Telehealth: Payer: Self-pay | Admitting: Family Medicine

## 2023-10-24 NOTE — Progress Notes (Signed)
   10/24/2023  Patient ID: Julia Nielsen, female   DOB: [birth year], 83 y.o.   MRN: [id number]  Patient outreach to see if patient has received her portion of [employer] PAP application for Trulicity  2025 re-enrollment I mailed out recently.  She has not yet received this; so I will check back Tuesday.  If not received by Tuesday, will schedule a time for her to come to [facility] Wednesday for us  to complete.  Julia Nielsen, PharmD, DPLA

## 2023-10-24 NOTE — Telephone Encounter (Signed)
 Copied from CRM [id number]. Topic: General - Inquiry >> Oct 24, 2023 11:25 AM Leonette SQUIBB wrote: Reason for CRM: pt called saying the letter she was supposed to receive from cheryl Deanna has not came yet.  It was for the patients part of the contract for the Trulicity .  CB@  [phone]

## 2023-10-27 ENCOUNTER — Other Ambulatory Visit: Payer: Self-pay

## 2023-10-27 DIAGNOSIS — H401132 Primary open-angle glaucoma, bilateral, moderate stage: Secondary | ICD-10-CM | POA: Diagnosis not present

## 2023-10-28 ENCOUNTER — Other Ambulatory Visit: Payer: Self-pay

## 2023-10-28 NOTE — Progress Notes (Signed)
   10/28/2023  Patient ID: Julia Nielsen, female   DOB: [birth year], 83 y.o.   MRN: [id number]  Patient never received [employer] 2025 re-enrollment application, but [employer] did receive copy of this.  Coordinated with office staff and patient; and [name] will be coming by [facility] to sign patient portion of the application, and staff will fax this into the program.  Provider portion has already been completed online.  I will check on the processing status first of next week.  Channing DELENA Mealing, PharmD, DPLA

## 2023-10-29 ENCOUNTER — Other Ambulatory Visit: Payer: Self-pay | Admitting: Family Medicine

## 2023-10-29 DIAGNOSIS — I2510 Atherosclerotic heart disease of native coronary artery without angina pectoris: Secondary | ICD-10-CM

## 2023-10-30 NOTE — Telephone Encounter (Signed)
Requested medication (s) are due for refill today: yes  Requested medication (s) are on the active medication list: yes  Last refill:  lotensin- 05/16/23 #90 1 refills , coreg- 05/16/23 #180 1 refills , catapress- 05/16/23 #180 1 refills   Future visit scheduled: yes in 2 weeks   Notes to clinic:   protocol failed last labs 05/16/23.  Do you want to refill Rxs?     Requested Prescriptions  Pending Prescriptions Disp Refills   benazepril (LOTENSIN) 40 MG tablet [Pharmacy Med Name: Benazepril HCl 40 MG Oral Tablet] 90 tablet 0    Sig: Take 1 tablet by mouth once daily     Cardiovascular:  ACE Inhibitors Failed - 10/30/2023  9:05 AM      Failed - Cr in normal range and within 180 days    Creatinine  Date Value Ref Range Status  06/02/2014 1.32 (H) 0.60 - 1.30 mg/dL Final   Creatinine, Ser  Date Value Ref Range Status  05/16/2023 1.28 (H) 0.57 - 1.00 mg/dL Final         Failed - Last BP in normal range    BP Readings from Last 1 Encounters:  09/02/23 (!) 149/69         Passed - K in normal range and within 180 days    Potassium  Date Value Ref Range Status  05/16/2023 4.2 3.5 - 5.2 mmol/L Final  06/02/2014 3.7 3.5 - 5.1 mmol/L Final         Passed - Patient is not pregnant      Passed - Valid encounter within last 6 months    Recent Outpatient Visits           1 month ago Dysfunction of both eustachian tubes   [place] [facility] Larae Grooms, NP   2 months ago Type 2 diabetes mellitus with stage 3 chronic kidney disease, without long-term current use of insulin, unspecified whether stage 3a or 3b CKD (HCC)   [place] [facility] [place], Megan P, DO   5 months ago Type 2 diabetes mellitus with stage 3 chronic kidney disease, without long-term current use of insulin, unspecified whether stage 3a or 3b CKD (HCC)   [place] [facility] [place], Megan P, DO   8 months ago Type 2 diabetes mellitus with stage 3 chronic kidney  disease, without long-term current use of insulin, unspecified whether stage 3a or 3b CKD (HCC)   [place] [facility] [place], Megan P, DO   11 months ago Routine general medical examination at a health care facility   [facility] [place], [place] P, DO       Future Appointments             In 2 weeks Laural Benes, Oralia Rud, DO [place] [facility]             carvedilol (COREG) 25 MG tablet [Pharmacy Med Name: Carvedilol 25 MG Oral Tablet] 180 tablet 0    Sig: TAKE 1 TABLET BY MOUTH TWICE DAILY WITH A MEAL     Cardiovascular: Beta Blockers 3 Failed - 10/30/2023  9:05 AM      Failed - Cr in normal range and within 360 days    Creatinine  Date Value Ref Range Status  06/02/2014 1.32 (H) 0.60 - 1.30 mg/dL Final   Creatinine, Ser  Date Value Ref Range Status  05/16/2023 1.28 (H) 0.57 - 1.00 mg/dL Final  Failed - Last BP in normal range    BP Readings from Last 1 Encounters:  09/02/23 (!) 149/69         Passed - AST in normal range and within 360 days    AST  Date Value Ref Range Status  05/16/2023 21 0 - 40 IU/L Final   AST (SGOT) Piccolo, Waived  Date Value Ref Range Status  10/29/2018 31 11 - 38 U/L Final         Passed - ALT in normal range and within 360 days    ALT  Date Value Ref Range Status  05/16/2023 20 0 - 32 IU/L Final   ALT (SGPT) Piccolo, Waived  Date Value Ref Range Status  10/29/2018 28 10 - 47 U/L Final         Passed - Last Heart Rate in normal range    Pulse Readings from Last 1 Encounters:  09/02/23 74         Passed - Valid encounter within last 6 months    Recent Outpatient Visits           1 month ago Dysfunction of both eustachian tubes   [place] [facility] Larae Grooms, NP   2 months ago Type 2 diabetes mellitus with stage 3 chronic kidney disease, without long-term current use of insulin, unspecified whether stage 3a or 3b CKD (HCC)   [facility] [place], Megan P, DO   5 months ago Type 2 diabetes mellitus with stage 3 chronic kidney disease, without long-term current use of insulin, unspecified whether stage 3a or 3b CKD (HCC)   [place] [facility] [place], Megan P, DO   8 months ago Type 2 diabetes mellitus with stage 3 chronic kidney disease, without long-term current use of insulin, unspecified whether stage 3a or 3b CKD (HCC)   [place] [facility] [place], Megan P, DO   11 months ago Routine general medical examination at a health care facility   [facility] [place], [place] P, DO       Future Appointments             In 2 weeks Laural Benes, Oralia Rud, DO [place] [facility]             cloNIDine (CATAPRES) 0.1 MG tablet [Pharmacy Med Name: cloNIDine HCl 0.1 MG Oral Tablet] 180 tablet 0    Sig: TAKE 1 TABLET BY MOUTH IN THE MORNING AND 1 IN THE EVENING     Cardiovascular:  Alpha-2 Agonists Failed - 10/30/2023  9:05 AM      Failed - Last BP in normal range    BP Readings from Last 1 Encounters:  09/02/23 (!) 149/69         Passed - Last Heart Rate in normal range    Pulse Readings from Last 1 Encounters:  09/02/23 74         Passed - Valid encounter within last 6 months    Recent Outpatient Visits           1 month ago Dysfunction of both eustachian tubes   [facility] Larae Grooms, NP   2 months ago Type 2 diabetes mellitus with stage 3 chronic kidney disease, without long-term current use of insulin, unspecified whether stage 3a or 3b CKD (HCC)   [place] [facility] [place], Megan P, DO   5 months ago Type 2 diabetes  mellitus with stage 3 chronic kidney disease, without long-term current use of insulin, unspecified whether stage 3a or 3b CKD (HCC)   [place] [facility] [place], Megan P, DO   8 months ago Type 2 diabetes mellitus with stage 3  chronic kidney disease, without long-term current use of insulin, unspecified whether stage 3a or 3b CKD (HCC)   [place] [facility] [place], Megan P, DO   11 months ago Routine general medical examination at a health care facility   [facility] Dorcas Carrow, DO       Future Appointments             In 2 weeks Laural Benes, Oralia Rud, DO [facility]

## 2023-11-04 NOTE — Progress Notes (Signed)
   11/04/2023  Patient ID: Julia Nielsen, female   DOB: 1941-03-20, 83 y.o.   MRN: [id number]  Contacted [employer] patient assistance program to follow-up on patient's 2025 reenrollment for Trulicity.  Patient has been approved to receive this medication through the program through the end of 2025.  Representative states that should arrive at the patient's home in the next 10-14 business days.  Contacted patient to make her aware.  Lenna Gilford, PharmD, DPLA

## 2023-11-19 ENCOUNTER — Encounter: Payer: Self-pay | Admitting: Family Medicine

## 2023-11-19 ENCOUNTER — Ambulatory Visit: Payer: PPO | Admitting: Family Medicine

## 2023-11-19 VITALS — BP 111/71 | HR 69 | Temp 97.5°F | Ht 61.5 in | Wt 167.8 lb

## 2023-11-19 DIAGNOSIS — E1122 Type 2 diabetes mellitus with diabetic chronic kidney disease: Secondary | ICD-10-CM | POA: Diagnosis not present

## 2023-11-19 DIAGNOSIS — N1832 Chronic kidney disease, stage 3b: Secondary | ICD-10-CM

## 2023-11-19 DIAGNOSIS — N2581 Secondary hyperparathyroidism of renal origin: Secondary | ICD-10-CM

## 2023-11-19 DIAGNOSIS — E785 Hyperlipidemia, unspecified: Secondary | ICD-10-CM

## 2023-11-19 DIAGNOSIS — I2510 Atherosclerotic heart disease of native coronary artery without angina pectoris: Secondary | ICD-10-CM

## 2023-11-19 DIAGNOSIS — Z0000 Encounter for general adult medical examination without abnormal findings: Secondary | ICD-10-CM

## 2023-11-19 DIAGNOSIS — I129 Hypertensive chronic kidney disease with stage 1 through stage 4 chronic kidney disease, or unspecified chronic kidney disease: Secondary | ICD-10-CM

## 2023-11-19 DIAGNOSIS — E039 Hypothyroidism, unspecified: Secondary | ICD-10-CM

## 2023-11-19 DIAGNOSIS — I700 Atherosclerosis of aorta: Secondary | ICD-10-CM | POA: Diagnosis not present

## 2023-11-19 DIAGNOSIS — E1169 Type 2 diabetes mellitus with other specified complication: Secondary | ICD-10-CM

## 2023-11-19 DIAGNOSIS — D692 Other nonthrombocytopenic purpura: Secondary | ICD-10-CM | POA: Diagnosis not present

## 2023-11-19 DIAGNOSIS — N183 Chronic kidney disease, stage 3 unspecified: Secondary | ICD-10-CM

## 2023-11-19 DIAGNOSIS — K7460 Unspecified cirrhosis of liver: Secondary | ICD-10-CM

## 2023-11-19 LAB — MICROALBUMIN, URINE WAIVED
Creatinine, Urine Waived: 100 mg/dL (ref 10–300)
Microalb, Ur Waived: 80 mg/L — ABNORMAL HIGH (ref 0–19)

## 2023-11-19 LAB — BAYER DCA HB A1C WAIVED: HB A1C (BAYER DCA - WAIVED): 6.7 % — ABNORMAL HIGH (ref 4.8–5.6)

## 2023-11-19 MED ORDER — MONTELUKAST SODIUM 10 MG PO TABS: 10.0000 mg | ORAL_TABLET | Freq: Every day | ORAL | 3 refills | Status: AC

## 2023-11-19 MED ORDER — AMLODIPINE BESYLATE 10 MG PO TABS: 10.0000 mg | ORAL_TABLET | Freq: Every morning | ORAL | 1 refills | Status: AC

## 2023-11-19 MED ORDER — TRULICITY 1.5 MG/0.5ML ~~LOC~~ SOAJ: 1.5000 mg | SUBCUTANEOUS | 4 refills | Status: AC

## 2023-11-19 MED ORDER — LOVASTATIN 40 MG PO TABS: 40.0000 mg | ORAL_TABLET | Freq: Every day | ORAL | 1 refills | Status: AC

## 2023-11-19 MED ORDER — FUROSEMIDE 20 MG PO TABS: 20.0000 mg | ORAL_TABLET | Freq: Every day | ORAL | 1 refills | Status: AC

## 2023-11-19 MED ORDER — GABAPENTIN 100 MG PO CAPS: 100.0000 mg | ORAL_CAPSULE | Freq: Every evening | ORAL | 1 refills | Status: AC | PRN

## 2023-11-19 MED ORDER — FLUTICASONE PROPIONATE 50 MCG/ACT NA SUSP: NASAL | 12 refills | Status: AC

## 2023-11-19 MED ORDER — CLONIDINE HCL 0.1 MG PO TABS: ORAL_TABLET | ORAL | 1 refills | Status: AC

## 2023-11-19 MED ORDER — ALBUTEROL SULFATE HFA 108 (90 BASE) MCG/ACT IN AERS: 2.0000 | INHALATION_SPRAY | Freq: Four times a day (QID) | RESPIRATORY_TRACT | 6 refills | Status: AC | PRN

## 2023-11-19 MED ORDER — CARVEDILOL 25 MG PO TABS: 25.0000 mg | ORAL_TABLET | Freq: Two times a day (BID) | ORAL | 1 refills | Status: AC

## 2023-11-19 MED ORDER — BENAZEPRIL HCL 40 MG PO TABS: 40.0000 mg | ORAL_TABLET | Freq: Every day | ORAL | 1 refills | Status: AC

## 2023-11-19 NOTE — Assessment & Plan Note (Signed)
 Under good control on current regimen. Continue current regimen. Continue to monitor. Call with any concerns. Refills given. Labs drawn today.

## 2023-11-19 NOTE — Assessment & Plan Note (Signed)
 Will keep her blood pressure and cholesterol and sugar under good control. Continue to monitor. Call with any concerns.

## 2023-11-19 NOTE — Assessment & Plan Note (Signed)
 Continue to follow with renal. Call with any concerns.

## 2023-11-19 NOTE — Assessment & Plan Note (Signed)
 Rechecking labs today. Await results. Treat as needed.

## 2023-11-19 NOTE — Assessment & Plan Note (Signed)
Stable with A1c of 6.7. Recheck 3 months. Call with any concerns.

## 2023-11-19 NOTE — Assessment & Plan Note (Signed)
 Rechecking labs today. Continue to monitor. Call with any concerns.

## 2023-11-19 NOTE — Progress Notes (Signed)
 BP 111/71   Pulse 69   Temp (!) 97.5 F (36.4 C) (Oral)   Ht 5' 1.5 (1.562 m)   Wt 167 lb 12.8 oz (76.1 kg)   SpO2 97%   BMI 31.19 kg/m    Subjective:    Patient ID: Julia Nielsen, female    DOB: [birth year], 83 y.o.   MRN: [id number]  HPI: CHRISTI WIRICK is a 83 y.o. female presenting on 11/19/2023 for comprehensive medical examination. Current medical complaints include:  DIABETES Hypoglycemic episodes:no Polydipsia/polyuria: no Visual disturbance: no Chest pain: no Paresthesias: no Glucose Monitoring: yes  Accucheck frequency: Daily Taking Insulin?: no Blood Pressure Monitoring: not checking Retinal Examination: Up to Date Foot Exam: Up to Date Diabetic Education: Completed Pneumovax: Up to Date Influenza: Up to Date Aspirin: no  HYPERTENSION / HYPERLIPIDEMIA Satisfied with current treatment? yes Duration of hypertension: chronic BP monitoring frequency: not checking BP medication side effects: no Past BP meds: amlodipine , benazepril , clonidine , carvedilol  Duration of hyperlipidemia: chronic Cholesterol medication side effects: no Cholesterol supplements: none Past cholesterol medications: lovastatin  Medication compliance: excellent compliance Aspirin: no Recent stressors: no Recurrent headaches: no Visual changes: no Palpitations: no Dyspnea: no Chest pain: no Lower extremity edema: no Dizzy/lightheaded: no  HYPOTHYROIDISM Thyroid  control status:controlled Satisfied with current treatment? yes Medication side effects: no Medication compliance: excellent compliance Recent dose adjustment:no Fatigue: no Cold intolerance: no Heat intolerance: no Weight gain: no Weight loss: no Constipation: no Diarrhea/loose stools: no Palpitations: no Lower extremity edema: no Anxiety/depressed mood: no  She currently lives with: husband Menopausal Symptoms: no  Depression Screen done today and results listed below:     11/19/2023    9:16 AM 08/19/2023     9:05 AM 05/16/2023   10:28 AM 05/13/2023    9:34 AM 02/11/2023    9:00 AM  Depression screen PHQ 2/9  Decreased Interest 0 0 0 0 0  Down, Depressed, Hopeless 0 0 0 0 0  PHQ - 2 Score 0 0 0 0 0  Altered sleeping 0 0 0 0 0  Tired, decreased energy 0 0 3 0 0  Change in appetite 0 0 0 0 0  Feeling bad or failure about yourself  0 0 0 0 0  Trouble concentrating 0 0 0 0 0  Moving slowly or fidgety/restless 0 0 0 0 0  Suicidal thoughts 0 0 0 0 0  PHQ-9 Score 0 0 3 0 0  Difficult doing work/chores Not difficult at all Not difficult at all Not difficult at all Not difficult at all Not difficult at all     Past Medical History:  Past Medical History:  Diagnosis Date   Arthritis    OSTEO OF KNEE   Breast cancer (HCC) 2000   left breast cancer, radiation tx's.   CAD (coronary artery disease)    Carotid artery stenosis    bilateral   Cataract    Chronic kidney disease    Chronic stage 3   Coronary heart disease    Diabetes mellitus without complication (HCC)    Edema of both legs    GERD (gastroesophageal reflux disease)    Glaucoma    Hyperlipemia    mixed   Hyperlipidemia    Hypertension    Hypothyroidism    Mitral insufficiency    moderate   Osteopenia    Osteopenia    Personal history of radiation therapy 2000   LEFT lumpectomy   Stenosis of carotid artery    Thrombocytopenia (HCC)  Tricuspid insufficiency    moderate    Surgical History:  Past Surgical History:  Procedure Laterality Date   APPENDECTOMY     BLADDER SURGERY     BREAST EXCISIONAL BIOPSY Left 2000   breast ca   BREAST LUMPECTOMY Left    BREAST SURGERY     CARDIAC CATHETERIZATION     CARPAL TUNNEL RELEASE Bilateral    CHOLECYSTECTOMY     COLONOSCOPY     COLONOSCOPY WITH PROPOFOL  N/A 08/12/2018   Procedure: COLONOSCOPY WITH PROPOFOL ;  Surgeon: Viktoria Lamar DASEN, MD;  Location[facility] ENDOSCOPY;  Service: Endoscopy;  Laterality: N/A;   JOINT REPLACEMENT Right 2015   knee   parathyroid  surgery      SHOULDER SURGERY Right    TUBAL LIGATION      Medications:  Current Outpatient Medications on File Prior to Visit  Medication Sig   Biotin w/ Vitamins C & E (HAIR/SKIN/NAILS PO) Take 1 tablet by mouth daily.   Blood Glucose Monitoring Suppl (ONE TOUCH ULTRA 2) w/Device KIT 1 each by Does not apply route daily.   brimonidine (ALPHAGAN) 0.2 % ophthalmic solution 1 drop 2 (two) times daily.   dorzolamide-timolol (COSOPT) 22.3-6.8 MG/ML ophthalmic solution Place 1 drop into both eyes 2 (two) times daily.    FARXIGA 10 MG TABS tablet Take 10 mg by mouth every morning.   fexofenadine (ALLEGRA) 180 MG tablet Take 180 mg by mouth daily.   glucose blood (ONETOUCH ULTRA) test strip USE TO CHECK BLOOD SUGAR AS NEEDED AS DIRECTED   latanoprost (XALATAN) 0.005 % ophthalmic solution Place 1 drop into both eyes at bedtime.    levothyroxine  (SYNTHROID ) 150 MCG tablet Take 1 tablet (150 mcg total) by mouth daily before breakfast.   OneTouch Delica Lancets 33G MISC USE AS DIRECTED ONCE DAILY WITH TEST STRIPS E11.22   No current facility-administered medications on file prior to visit.    Allergies:  Allergies  Allergen Reactions   Nirmatrelvir-Ritonavir Other (See Comments)    Increased blood sugar   Prednisone      Elevated blood sugar     Social History:  Social History   Socioeconomic History   Marital status: Married    Spouse name: Not on file   Number of children: Not on file   Years of education: Not on file   Highest education level: High school graduate  Occupational History   Occupation: retired  Tobacco Use   Smoking status: Never   Smokeless tobacco: Never  Vaping Use   Vaping status: Never Used  Substance and Sexual Activity   Alcohol use: No   Drug use: No   Sexual activity: Not Currently  Other Topics Concern   Not on file  Social History Narrative   Not on file   Social Drivers of Health   Financial Resource Strain: Low Risk  (05/13/2023)   Overall Financial  Resource Strain (CARDIA)    Difficulty of Paying Living Expenses: Not hard at all  Food Insecurity: No Food Insecurity (05/13/2023)   Hunger Vital Sign    Worried About Running Out of Food in the Last Year: Never true    Ran Out of Food in the Last Year: Never true  Transportation Needs: No Transportation Needs (05/13/2023)   PRAPARE - Administrator, Civil Service (Medical): No    Lack of Transportation (Non-Medical): No  Physical Activity: Sufficiently Active (05/13/2023)   Exercise Vital Sign    Days of Exercise per Week: 7 days  Minutes of Exercise per Session: 30 min  Stress: No Stress Concern Present (05/13/2023)   [employer] of Occupational Health - Occupational Stress Questionnaire    Feeling of Stress : Not at all  Social Connections: Unknown (05/13/2023)   Social Connection and Isolation Panel [NHANES]    Frequency of Communication with Friends and Family: Twice a week    Frequency of Social Gatherings with Friends and Family: Not on file    Attends Religious Services: Never    Database Administrator or Organizations: No    Attends Banker Meetings: Never    Marital Status: Married  Catering Manager Violence: Not At Risk (05/13/2023)   Humiliation, Afraid, Rape, and Kick questionnaire    Fear of Current or Ex-Partner: No    Emotionally Abused: No    Physically Abused: No    Sexually Abused: No   Social History   Tobacco Use  Smoking Status Never  Smokeless Tobacco Never   Social History   Substance and Sexual Activity  Alcohol Use No    Family History:  Family History  Problem Relation Age of Onset   Gallbladder disease Mother    Aneurysm Mother    COPD Father    Congestive Heart Failure Father    Emphysema Father    Leukemia Sister    Stroke Brother    Dementia Brother    Alzheimer's disease Brother    Congestive Heart Failure Sister    Emphysema Sister    Breast cancer Neg Hx     Past medical history, surgical  history, medications, allergies, family history and social history reviewed with patient today and changes made to appropriate areas of the chart.   Review of Systems  Constitutional: Negative.   HENT:  Positive for tinnitus. Negative for congestion, ear discharge, ear pain, hearing loss, nosebleeds, sinus pain and sore throat.   Eyes: Negative.   Respiratory: Negative.  Negative for stridor.   Cardiovascular: Negative.   Gastrointestinal:  Positive for diarrhea. Negative for abdominal pain, blood in stool, constipation, heartburn, melena, nausea and vomiting.  Genitourinary: Negative.   Musculoskeletal: Negative.   Skin: Negative.   Neurological: Negative.   Endo/Heme/Allergies:  Positive for environmental allergies. Negative for polydipsia. Bruises/bleeds easily.  Psychiatric/Behavioral: Negative.     All other ROS negative except what is listed above and in the HPI.      Objective:    BP 111/71   Pulse 69   Temp (!) 97.5 F (36.4 C) (Oral)   Ht 5' 1.5 (1.562 m)   Wt 167 lb 12.8 oz (76.1 kg)   SpO2 97%   BMI 31.19 kg/m   Wt Readings from Last 3 Encounters:  11/19/23 167 lb 12.8 oz (76.1 kg)  09/02/23 169 lb 6.4 oz (76.8 kg)  08/19/23 175 lb 6.4 oz (79.6 kg)    Physical Exam Vitals and nursing note reviewed.  Constitutional:      General: She is not in acute distress.    Appearance: Normal appearance. She is obese. She is not ill-appearing, toxic-appearing or diaphoretic.  HENT:     Head: Normocephalic and atraumatic.     Right Ear: Tympanic membrane, ear canal and external ear normal. There is no impacted cerumen.     Left Ear: Tympanic membrane, ear canal and external ear normal. There is no impacted cerumen.     Nose: Nose normal. No congestion or rhinorrhea.     Mouth/Throat:     Mouth: Mucous membranes are moist.  Pharynx: Oropharynx is clear. No oropharyngeal exudate or posterior oropharyngeal erythema.  Eyes:     General: No scleral icterus.       Right  eye: No discharge.        Left eye: No discharge.     Extraocular Movements: Extraocular movements intact.     Conjunctiva/sclera: Conjunctivae normal.     Pupils: Pupils are equal, round, and reactive to light.  Neck:     Vascular: No carotid bruit.  Cardiovascular:     Rate and Rhythm: Normal rate and regular rhythm.     Pulses: Normal pulses.     Heart sounds: No murmur heard.    No friction rub. No gallop.  Pulmonary:     Effort: Pulmonary effort is normal. No respiratory distress.     Breath sounds: Normal breath sounds. No stridor. No wheezing, rhonchi or rales.  Chest:     Chest wall: No tenderness.  Abdominal:     General: Abdomen is flat. Bowel sounds are normal. There is no distension.     Palpations: Abdomen is soft. There is no mass.     Tenderness: There is no abdominal tenderness. There is no right CVA tenderness, left CVA tenderness, guarding or rebound.     Hernia: No hernia is present.  Genitourinary:    Comments: Breast and pelvic exams deferred with shared decision making Musculoskeletal:        General: No swelling, tenderness, deformity or signs of injury.     Cervical back: Normal range of motion and neck supple. No rigidity. No muscular tenderness.     Right lower leg: No edema.     Left lower leg: No edema.  Lymphadenopathy:     Cervical: No cervical adenopathy.  Skin:    General: Skin is warm and dry.     Capillary Refill: Capillary refill takes less than 2 seconds.     Coloration: Skin is not jaundiced or pale.     Findings: No bruising, erythema, lesion or rash.  Neurological:     General: No focal deficit present.     Mental Status: She is alert and oriented to person, place, and time. Mental status is at baseline.     Cranial Nerves: No cranial nerve deficit.     Sensory: No sensory deficit.     Motor: No weakness.     Coordination: Coordination normal.     Gait: Gait normal.     Deep Tendon Reflexes: Reflexes normal.  Psychiatric:         Mood and Affect: Mood normal.        Behavior: Behavior normal.        Thought Content: Thought content normal.        Judgment: Judgment normal.     Results for orders placed or performed in visit on 08/19/23  Bayer DCA Hb A1c Waived   Collection Time: 08/19/23  9:10 AM  Result Value Ref Range   HB A1C (BAYER DCA - WAIVED) 6.5 (H) 4.8 - 5.6 %  TSH   Collection Time: 08/19/23  9:29 AM  Result Value Ref Range   TSH 2.960 0.450 - 4.500 uIU/mL      Assessment & Plan:   Problem List Items Addressed This Visit       Cardiovascular and Mediastinum   CAD (coronary artery disease)   Will keep her blood pressure and cholesterol and sugar under good control. Continue to monitor. Call with any concerns.  Relevant Medications   amLODipine  (NORVASC ) 10 MG tablet   benazepril  (LOTENSIN ) 40 MG tablet   carvedilol  (COREG ) 25 MG tablet   cloNIDine  (CATAPRES ) 0.1 MG tablet   furosemide  (LASIX ) 20 MG tablet   lovastatin  (MEVACOR ) 40 MG tablet   Atherosclerosis of aorta (HCC)   Will keep her blood pressure and cholesterol and sugar under good control. Continue to monitor. Call with any concerns.       Relevant Medications   amLODipine  (NORVASC ) 10 MG tablet   benazepril  (LOTENSIN ) 40 MG tablet   carvedilol  (COREG ) 25 MG tablet   cloNIDine  (CATAPRES ) 0.1 MG tablet   furosemide  (LASIX ) 20 MG tablet   lovastatin  (MEVACOR ) 40 MG tablet   Senile purpura (HCC)   Reassured patient. Continue to monitor. Call with any concerns.       Relevant Medications   amLODipine  (NORVASC ) 10 MG tablet   benazepril  (LOTENSIN ) 40 MG tablet   carvedilol  (COREG ) 25 MG tablet   cloNIDine  (CATAPRES ) 0.1 MG tablet   furosemide  (LASIX ) 20 MG tablet   lovastatin  (MEVACOR ) 40 MG tablet     Digestive   Cirrhosis of liver without ascites (HCC)   Rechecking labs today. Continue to monitor. Call with any concerns.         Endocrine   Hyperlipidemia associated with type 2 diabetes mellitus (HCC)    Under good control on current regimen. Continue current regimen. Continue to monitor. Call with any concerns. Refills given. Labs drawn today.        Relevant Medications   FARXIGA 10 MG TABS tablet   amLODipine  (NORVASC ) 10 MG tablet   benazepril  (LOTENSIN ) 40 MG tablet   carvedilol  (COREG ) 25 MG tablet   cloNIDine  (CATAPRES ) 0.1 MG tablet   Dulaglutide  (TRULICITY ) 1.5 MG/0.5ML SOAJ   furosemide  (LASIX ) 20 MG tablet   lovastatin  (MEVACOR ) 40 MG tablet   Other Relevant Orders   CBC with Differential/Platelet   Lipid Panel w/o Chol/HDL Ratio   Comprehensive metabolic panel   Type 2 diabetes mellitus with stage 3 chronic kidney disease (HCC)   Stable with A1c of 6.7. Recheck 3 months. Call with any concerns.       Relevant Medications   FARXIGA 10 MG TABS tablet   benazepril  (LOTENSIN ) 40 MG tablet   Dulaglutide  (TRULICITY ) 1.5 MG/0.5ML SOAJ   lovastatin  (MEVACOR ) 40 MG tablet   Other Relevant Orders   Bayer DCA Hb A1c Waived   CBC with Differential/Platelet   Comprehensive metabolic panel   Microalbumin, Urine Waived   Secondary hyperparathyroidism of renal origin (HCC)   Continue to follow with renal. Call with any concerns.       Hypothyroidism   Rechecking labs today. Await results. Treat as needed.       Relevant Medications   carvedilol  (COREG ) 25 MG tablet   Other Relevant Orders   CBC with Differential/Platelet   Comprehensive metabolic panel   TSH     Genitourinary   Benign hypertensive renal disease   Under good control on current regimen. Continue current regimen. Continue to monitor. Call with any concerns. Refills given. Labs drawn today.       CKD (chronic kidney disease) stage 3, GFR 30-59 ml/min (HCC)   Rechecking labs today. Continue to follow with renal. Call with any concerns.       Other Visit Diagnoses       Routine general medical examination at a health care facility    -  Primary   Vaccines up to  date. Screening labs checked today.  Mammo and DEXA up to date. Continue diet and exercise. Call with any concerns.        Follow up plan: Return in about 3 months (around 02/16/2024).   LABORATORY TESTING:  - Pap smear: not applicable  IMMUNIZATIONS:   - Tdap: Tetanus vaccination status reviewed: last tetanus booster within 10 years. - Influenza: Up to date - Pneumovax: Up to date - Prevnar: Up to date - COVID: Refused - HPV: Not applicable - Shingrix vaccine: Refused  SCREENING: -Mammogram: up to date  - Colonoscopy: Not applicable  - Bone Density: up to date   PATIENT COUNSELING:   Advised to take 1 mg of folate supplement per day if capable of pregnancy.   Sexuality: Discussed sexually transmitted diseases, partner selection, use of condoms, avoidance of unintended pregnancy  and contraceptive alternatives.   Advised to avoid cigarette smoking.  I discussed with the patient that most people either abstain from alcohol or drink within safe limits (<=14/week and <=4 drinks/occasion for males, <=7/weeks and <= 3 drinks/occasion for females) and that the risk for alcohol disorders and other health effects rises proportionally with the number of drinks per week and how often a drinker exceeds daily limits.  Discussed cessation/primary prevention of drug use and availability of treatment for abuse.   Diet: Encouraged to adjust caloric intake to maintain  or achieve ideal body weight, to reduce intake of dietary saturated fat and total fat, to limit sodium intake by avoiding high sodium foods and not adding table salt, and to maintain adequate dietary potassium and calcium preferably from fresh fruits, vegetables, and low-fat dairy products.    stressed the importance of regular exercise  Injury prevention: Discussed safety belts, safety helmets, smoke detector, smoking near bedding or upholstery.   Dental health: Discussed importance of regular tooth brushing, flossing, and dental visits.    NEXT PREVENTATIVE  PHYSICAL DUE IN 1 YEAR. Return in about 3 months (around 02/16/2024).

## 2023-11-19 NOTE — Assessment & Plan Note (Signed)
 Reassured patient. Continue to monitor. Call with any concerns.

## 2023-11-19 NOTE — Assessment & Plan Note (Signed)
 Rechecking labs today. Continue to follow with renal. Call with any concerns.

## 2023-11-20 LAB — CBC WITH DIFFERENTIAL/PLATELET
Basophils Absolute: 0 10*3/uL (ref 0.0–0.2)
Basos: 1 %
EOS (ABSOLUTE): 0.1 10*3/uL (ref 0.0–0.4)
Eos: 2 %
Hematocrit: 40.3 % (ref 34.0–46.6)
Hemoglobin: 12.9 g/dL (ref 11.1–15.9)
Immature Grans (Abs): 0 10*3/uL (ref 0.0–0.1)
Immature Granulocytes: 1 %
Lymphocytes Absolute: 1.2 10*3/uL (ref 0.7–3.1)
Lymphs: 27 %
MCH: 29.6 pg (ref 26.6–33.0)
MCHC: 32 g/dL (ref 31.5–35.7)
MCV: 92 fL (ref 79–97)
Monocytes Absolute: 0.4 10*3/uL (ref 0.1–0.9)
Monocytes: 8 %
Neutrophils Absolute: 2.6 10*3/uL (ref 1.4–7.0)
Neutrophils: 61 %
Platelets: 85 10*3/uL — CL (ref 150–450)
RBC: 4.36 x10E6/uL (ref 3.77–5.28)
RDW: 13.3 % (ref 11.7–15.4)
WBC: 4.3 10*3/uL (ref 3.4–10.8)

## 2023-11-20 LAB — COMPREHENSIVE METABOLIC PANEL
ALT: 20 [IU]/L (ref 0–32)
AST: 21 [IU]/L (ref 0–40)
Albumin: 3.8 g/dL (ref 3.7–4.7)
Alkaline Phosphatase: 57 [IU]/L (ref 44–121)
BUN/Creatinine Ratio: 15 (ref 12–28)
BUN: 23 mg/dL (ref 8–27)
Bilirubin Total: 0.5 mg/dL (ref 0.0–1.2)
CO2: 21 mmol/L (ref 20–29)
Calcium: 9.5 mg/dL (ref 8.7–10.3)
Chloride: 107 mmol/L — ABNORMAL HIGH (ref 96–106)
Creatinine, Ser: 1.52 mg/dL — ABNORMAL HIGH (ref 0.57–1.00)
Globulin, Total: 2.4 g/dL (ref 1.5–4.5)
Glucose: 202 mg/dL — ABNORMAL HIGH (ref 70–99)
Potassium: 4.2 mmol/L (ref 3.5–5.2)
Sodium: 142 mmol/L (ref 134–144)
Total Protein: 6.2 g/dL (ref 6.0–8.5)
eGFR: 34 mL/min/{1.73_m2} — ABNORMAL LOW (ref >59)

## 2023-11-20 LAB — LIPID PANEL W/O CHOL/HDL RATIO
Cholesterol, Total: 128 mg/dL (ref 100–199)
HDL: 50 mg/dL (ref >39)
LDL Chol Calc (NIH): 57 mg/dL (ref 0–99)
Triglycerides: 119 mg/dL (ref 0–149)
VLDL Cholesterol Cal: 21 mg/dL (ref 5–40)

## 2023-11-20 LAB — TSH: TSH: 2.6 u[IU]/mL (ref 0.450–4.500)

## 2023-11-23 ENCOUNTER — Encounter: Payer: Self-pay | Admitting: Family Medicine

## 2023-11-24 ENCOUNTER — Other Ambulatory Visit: Payer: Self-pay | Admitting: Family Medicine

## 2023-11-24 NOTE — Progress Notes (Signed)
 Results printed ans mailed

## 2023-11-24 NOTE — Telephone Encounter (Signed)
Copied from CRM [id number]. Topic: General - Other >> Nov 24, 2023 11:39 AM Everette C wrote: Reason for CRM: Medication Refill - Most Recent Primary Care Visit:  Provider: Larae Grooms Department: [id number] Visit Type: OFFICE VISIT Date: 09/02/2023  Medication: amLODipine (NORVASC) 10 MG tablet [[id number]]  Has the patient contacted their pharmacy? Yes (Agent: If no, request that the patient contact the pharmacy for the refill. If patient does not wish to contact the pharmacy document the reason why and proceed with request.) (Agent: If yes, when and what did the pharmacy advise?)  Is this the correct pharmacy for this prescription? Yes If no, delete pharmacy and type the correct one.  This is the patient's preferred pharmacy:  [facility] [address]), [place] - [address] [place]) [place] [address] Phone: [phone] Fax: [phone]   Has the prescription been filled recently? Yes  Is the patient out of the medication? Yes  Has the patient been seen for an appointment in the last year OR does the patient have an upcoming appointment? Yes  Can we respond through MyChart? No  Agent: Please be advised that Rx refills may take up to 3 business days. We ask that you follow-up with your pharmacy.

## 2023-12-02 NOTE — Progress Notes (Signed)
Results printed and mailed.   

## 2023-12-25 DIAGNOSIS — N1831 Chronic kidney disease, stage 3a: Secondary | ICD-10-CM | POA: Diagnosis not present

## 2023-12-25 DIAGNOSIS — I10 Essential (primary) hypertension: Secondary | ICD-10-CM | POA: Diagnosis not present

## 2023-12-25 DIAGNOSIS — I2510 Atherosclerotic heart disease of native coronary artery without angina pectoris: Secondary | ICD-10-CM | POA: Diagnosis not present

## 2023-12-25 DIAGNOSIS — E785 Hyperlipidemia, unspecified: Secondary | ICD-10-CM | POA: Diagnosis not present

## 2023-12-25 DIAGNOSIS — I700 Atherosclerosis of aorta: Secondary | ICD-10-CM | POA: Diagnosis not present

## 2023-12-25 DIAGNOSIS — R9431 Abnormal electrocardiogram [ECG] [EKG]: Secondary | ICD-10-CM | POA: Diagnosis not present

## 2023-12-25 DIAGNOSIS — E119 Type 2 diabetes mellitus without complications: Secondary | ICD-10-CM | POA: Diagnosis not present

## 2023-12-25 DIAGNOSIS — I6523 Occlusion and stenosis of bilateral carotid arteries: Secondary | ICD-10-CM | POA: Diagnosis not present

## 2024-02-11 DIAGNOSIS — N1832 Chronic kidney disease, stage 3b: Secondary | ICD-10-CM | POA: Diagnosis not present

## 2024-02-11 DIAGNOSIS — E1122 Type 2 diabetes mellitus with diabetic chronic kidney disease: Secondary | ICD-10-CM | POA: Diagnosis not present

## 2024-02-11 DIAGNOSIS — I10 Essential (primary) hypertension: Secondary | ICD-10-CM | POA: Diagnosis not present

## 2024-02-11 DIAGNOSIS — E782 Mixed hyperlipidemia: Secondary | ICD-10-CM | POA: Diagnosis not present

## 2024-02-11 DIAGNOSIS — E039 Hypothyroidism, unspecified: Secondary | ICD-10-CM | POA: Diagnosis not present

## 2024-02-11 DIAGNOSIS — Z794 Long term (current) use of insulin: Secondary | ICD-10-CM | POA: Diagnosis not present

## 2024-02-12 ENCOUNTER — Other Ambulatory Visit: Payer: Self-pay | Admitting: Family Medicine

## 2024-02-12 DIAGNOSIS — N183 Chronic kidney disease, stage 3 unspecified: Secondary | ICD-10-CM

## 2024-02-12 DIAGNOSIS — E1122 Type 2 diabetes mellitus with diabetic chronic kidney disease: Secondary | ICD-10-CM

## 2024-02-15 NOTE — Telephone Encounter (Signed)
 Requested medication (s) are due for refill today - no  Requested medication (s) are on the active medication list -no  Future visit scheduled -yes  Last refill: unsure  Notes to clinic: strips not listed on current medication list- machine and lancets are listed- but no strips  Requested Prescriptions  Pending Prescriptions Disp Refills   ONETOUCH ULTRA test strip [Pharmacy Med Name: OneTouch Ultra Blue In Vitro Strip] 100 each 0    Sig: USE TO CHECK BLOOD SUGAR AS NEEDED AS DIRECTED     Endocrinology: Diabetes - Testing Supplies Passed - 02/15/2024 12:31 PM      Passed - Valid encounter within last 12 months    Recent Outpatient Visits           2 months ago Routine general medical examination at a health care facility   [facility] [place], Jerilee Montane, DO       Future Appointments             In 1 week Lincoln Renshaw, Jerilee Montane, DO [facility]               Requested Prescriptions  Pending Prescriptions Disp Refills   ONETOUCH ULTRA test strip [Pharmacy Med Name: OneTouch Ultra Blue In Vitro Strip] 100 each 0    Sig: USE TO CHECK BLOOD SUGAR AS NEEDED AS DIRECTED     Endocrinology: Diabetes - Testing Supplies Passed - 02/15/2024 12:31 PM      Passed - Valid encounter within last 12 months    Recent Outpatient Visits           2 months ago Routine general medical examination at a health care facility   [facility] [place], Jerilee Montane, DO       Future Appointments             In 1 week Solomon Dupre, DO  [facility]

## 2024-02-19 DIAGNOSIS — D631 Anemia in chronic kidney disease: Secondary | ICD-10-CM | POA: Diagnosis not present

## 2024-02-19 DIAGNOSIS — E1122 Type 2 diabetes mellitus with diabetic chronic kidney disease: Secondary | ICD-10-CM | POA: Diagnosis not present

## 2024-02-19 DIAGNOSIS — I129 Hypertensive chronic kidney disease with stage 1 through stage 4 chronic kidney disease, or unspecified chronic kidney disease: Secondary | ICD-10-CM | POA: Diagnosis not present

## 2024-02-19 DIAGNOSIS — N2581 Secondary hyperparathyroidism of renal origin: Secondary | ICD-10-CM | POA: Diagnosis not present

## 2024-02-19 DIAGNOSIS — R809 Proteinuria, unspecified: Secondary | ICD-10-CM | POA: Diagnosis not present

## 2024-02-19 DIAGNOSIS — N1832 Chronic kidney disease, stage 3b: Secondary | ICD-10-CM | POA: Diagnosis not present

## 2024-02-26 DIAGNOSIS — R809 Proteinuria, unspecified: Secondary | ICD-10-CM | POA: Diagnosis not present

## 2024-02-26 DIAGNOSIS — I129 Hypertensive chronic kidney disease with stage 1 through stage 4 chronic kidney disease, or unspecified chronic kidney disease: Secondary | ICD-10-CM | POA: Diagnosis not present

## 2024-02-26 DIAGNOSIS — E1122 Type 2 diabetes mellitus with diabetic chronic kidney disease: Secondary | ICD-10-CM | POA: Diagnosis not present

## 2024-02-26 DIAGNOSIS — N2581 Secondary hyperparathyroidism of renal origin: Secondary | ICD-10-CM | POA: Diagnosis not present

## 2024-02-26 DIAGNOSIS — R808 Other proteinuria: Secondary | ICD-10-CM | POA: Diagnosis not present

## 2024-02-26 DIAGNOSIS — N1832 Chronic kidney disease, stage 3b: Secondary | ICD-10-CM | POA: Diagnosis not present

## 2024-02-26 DIAGNOSIS — E876 Hypokalemia: Secondary | ICD-10-CM | POA: Diagnosis not present

## 2024-02-26 DIAGNOSIS — I10 Essential (primary) hypertension: Secondary | ICD-10-CM | POA: Diagnosis not present

## 2024-02-26 DIAGNOSIS — N281 Cyst of kidney, acquired: Secondary | ICD-10-CM | POA: Diagnosis not present

## 2024-02-26 DIAGNOSIS — R600 Localized edema: Secondary | ICD-10-CM | POA: Diagnosis not present

## 2024-02-26 DIAGNOSIS — D631 Anemia in chronic kidney disease: Secondary | ICD-10-CM | POA: Diagnosis not present

## 2024-02-26 DIAGNOSIS — N2889 Other specified disorders of kidney and ureter: Secondary | ICD-10-CM | POA: Diagnosis not present

## 2024-02-27 ENCOUNTER — Ambulatory Visit: Payer: PPO | Admitting: Family Medicine

## 2024-04-19 DIAGNOSIS — R9431 Abnormal electrocardiogram [ECG] [EKG]: Secondary | ICD-10-CM | POA: Diagnosis not present

## 2024-04-19 DIAGNOSIS — E785 Hyperlipidemia, unspecified: Secondary | ICD-10-CM | POA: Diagnosis not present

## 2024-04-19 DIAGNOSIS — N1832 Chronic kidney disease, stage 3b: Secondary | ICD-10-CM | POA: Diagnosis not present

## 2024-04-19 DIAGNOSIS — I700 Atherosclerosis of aorta: Secondary | ICD-10-CM | POA: Diagnosis not present

## 2024-04-19 DIAGNOSIS — I6523 Occlusion and stenosis of bilateral carotid arteries: Secondary | ICD-10-CM | POA: Diagnosis not present

## 2024-04-19 DIAGNOSIS — I2510 Atherosclerotic heart disease of native coronary artery without angina pectoris: Secondary | ICD-10-CM | POA: Diagnosis not present

## 2024-04-19 DIAGNOSIS — E119 Type 2 diabetes mellitus without complications: Secondary | ICD-10-CM | POA: Diagnosis not present

## 2024-04-19 DIAGNOSIS — I10 Essential (primary) hypertension: Secondary | ICD-10-CM | POA: Diagnosis not present

## 2024-04-27 DIAGNOSIS — H16223 Keratoconjunctivitis sicca, not specified as Sjogren's, bilateral: Secondary | ICD-10-CM | POA: Diagnosis not present

## 2024-04-27 DIAGNOSIS — Z961 Presence of intraocular lens: Secondary | ICD-10-CM | POA: Diagnosis not present

## 2024-04-27 DIAGNOSIS — H401132 Primary open-angle glaucoma, bilateral, moderate stage: Secondary | ICD-10-CM | POA: Diagnosis not present

## 2024-05-25 DIAGNOSIS — N1832 Chronic kidney disease, stage 3b: Secondary | ICD-10-CM | POA: Diagnosis not present

## 2024-05-25 DIAGNOSIS — E1122 Type 2 diabetes mellitus with diabetic chronic kidney disease: Secondary | ICD-10-CM | POA: Diagnosis not present

## 2024-05-25 DIAGNOSIS — E782 Mixed hyperlipidemia: Secondary | ICD-10-CM | POA: Diagnosis not present

## 2024-05-25 DIAGNOSIS — E039 Hypothyroidism, unspecified: Secondary | ICD-10-CM | POA: Diagnosis not present

## 2024-05-25 DIAGNOSIS — I10 Essential (primary) hypertension: Secondary | ICD-10-CM | POA: Diagnosis not present

## 2024-05-25 DIAGNOSIS — Z794 Long term (current) use of insulin: Secondary | ICD-10-CM | POA: Diagnosis not present

## 2024-06-01 DIAGNOSIS — E039 Hypothyroidism, unspecified: Secondary | ICD-10-CM | POA: Diagnosis not present

## 2024-06-01 DIAGNOSIS — D696 Thrombocytopenia, unspecified: Secondary | ICD-10-CM | POA: Diagnosis not present

## 2024-06-01 DIAGNOSIS — E1122 Type 2 diabetes mellitus with diabetic chronic kidney disease: Secondary | ICD-10-CM | POA: Diagnosis not present

## 2024-06-01 DIAGNOSIS — N1832 Chronic kidney disease, stage 3b: Secondary | ICD-10-CM | POA: Diagnosis not present

## 2024-06-01 DIAGNOSIS — I10 Essential (primary) hypertension: Secondary | ICD-10-CM | POA: Diagnosis not present

## 2024-06-01 DIAGNOSIS — D693 Immune thrombocytopenic purpura: Secondary | ICD-10-CM | POA: Diagnosis not present

## 2024-06-01 DIAGNOSIS — E782 Mixed hyperlipidemia: Secondary | ICD-10-CM | POA: Diagnosis not present

## 2024-06-01 DIAGNOSIS — E66811 Obesity, class 1: Secondary | ICD-10-CM | POA: Diagnosis not present

## 2024-06-01 DIAGNOSIS — E6609 Other obesity due to excess calories: Secondary | ICD-10-CM | POA: Diagnosis not present

## 2024-06-01 DIAGNOSIS — Z6831 Body mass index (BMI) 31.0-31.9, adult: Secondary | ICD-10-CM | POA: Diagnosis not present

## 2024-06-21 ENCOUNTER — Inpatient Hospital Stay: Attending: Oncology | Admitting: Oncology

## 2024-06-21 ENCOUNTER — Inpatient Hospital Stay

## 2024-06-21 ENCOUNTER — Encounter: Payer: Self-pay | Admitting: Oncology

## 2024-06-21 VITALS — BP 142/59 | HR 70 | Temp 97.3°F | Resp 18 | Ht 61.5 in | Wt 167.8 lb

## 2024-06-21 DIAGNOSIS — K7460 Unspecified cirrhosis of liver: Secondary | ICD-10-CM | POA: Diagnosis not present

## 2024-06-21 DIAGNOSIS — D696 Thrombocytopenia, unspecified: Secondary | ICD-10-CM | POA: Diagnosis not present

## 2024-06-21 DIAGNOSIS — Z8719 Personal history of other diseases of the digestive system: Secondary | ICD-10-CM | POA: Diagnosis not present

## 2024-06-21 DIAGNOSIS — K766 Portal hypertension: Secondary | ICD-10-CM | POA: Insufficient documentation

## 2024-06-21 DIAGNOSIS — R161 Splenomegaly, not elsewhere classified: Secondary | ICD-10-CM | POA: Insufficient documentation

## 2024-06-21 LAB — CBC WITH DIFFERENTIAL/PLATELET
Abs Immature Granulocytes: 0.03 K/uL (ref 0.00–0.07)
Basophils Absolute: 0 K/uL (ref 0.0–0.1)
Basophils Relative: 0 %
Eosinophils Absolute: 0.1 K/uL (ref 0.0–0.5)
Eosinophils Relative: 2 %
HCT: 40 % (ref 36.0–46.0)
Hemoglobin: 12.8 g/dL (ref 12.0–15.0)
Immature Granulocytes: 1 %
Lymphocytes Relative: 27 %
Lymphs Abs: 1.3 K/uL (ref 0.7–4.0)
MCH: 29.1 pg (ref 26.0–34.0)
MCHC: 32 g/dL (ref 30.0–36.0)
MCV: 90.9 fL (ref 80.0–100.0)
Monocytes Absolute: 0.4 K/uL (ref 0.1–1.0)
Monocytes Relative: 8 %
Neutro Abs: 3 K/uL (ref 1.7–7.7)
Neutrophils Relative %: 62 %
Platelets: 76 K/uL — ABNORMAL LOW (ref 150–400)
RBC: 4.4 MIL/uL (ref 3.87–5.11)
RDW: 14.1 % (ref 11.5–15.5)
Smear Review: NORMAL
WBC: 4.8 K/uL (ref 4.0–10.5)
nRBC: 0 % (ref 0.0–0.2)

## 2024-06-21 LAB — PROTIME-INR
INR: 1.3 — ABNORMAL HIGH (ref 0.8–1.2)
Prothrombin Time: 16.6 s — ABNORMAL HIGH (ref 11.4–15.2)

## 2024-06-21 LAB — TECHNOLOGIST SMEAR REVIEW: Plt Morphology: DECREASED

## 2024-06-21 LAB — FOLATE: Folate: 12.5 ng/mL (ref >5.9)

## 2024-06-21 LAB — VITAMIN B12: Vitamin B-12: 695 pg/mL (ref 180–914)

## 2024-06-21 NOTE — Progress Notes (Signed)
 Patient is an established new patient; referred for Thrombocytopenia.  Patient states she has no energy.

## 2024-06-21 NOTE — Progress Notes (Signed)
 Hematology/Oncology Consult note [facility] [phone] [phone]  Patient Care Team: Vicci Duwaine SQUIBB, DO as PCP - General (Family Medicine) Deanna Channing LABOR, [facility] (Pharmacist)   Name of the patient: Julia Nielsen  [id number]  Mar 25, 1941    Reason for referral-thrombocytopenia   Referring physician-Dr. Alla  Date of visit: 06/21/24   History of presenting illness- Patient is a 83 year old female with past medical history significant for type 2 diabetes, stage III chronic kidney disease, hypothyroidism hyperlipidemia among other medical problems referred for thrombocytopenia.  Most recent CBC from 06/02/2024 showed white count of 3.7, H&H of 13.2/40.8 with an MCV of 91.1 and a platelet count of 80.  Of note patient has had chronic thrombocytopenia with a platelet count that has remained stable between 70-80 at least dating back to 2020.  White count and hemoglobin have always been normal.  Of note patient has history of cirrhosis.  She had a CT abdomen back in November 2020 which showed marked hepatic nodularity with signs of portal hypertension and including recanalized umbilical vein. She has not seen GI for her cirrhosis.  LFTs from 06/02/2024 were normal with an albumin of 3.9  Patient is not aware regarding her history of cirrhosis.  She has been a lifelong nonalcoholic.  She was overweight in the past.  ECOG PS- 1  Pain scale- 0   Review of systems- Review of Systems  Constitutional:  Negative for chills, fever, malaise/fatigue and weight loss.  HENT:  Negative for congestion, ear discharge and nosebleeds.   Eyes:  Negative for blurred vision.  Respiratory:  Negative for cough, hemoptysis, sputum production, shortness of breath and wheezing.   Cardiovascular:  Negative for chest pain, palpitations, orthopnea and claudication.  Gastrointestinal:  Negative for abdominal pain, blood in stool, constipation, diarrhea, heartburn,  melena, nausea and vomiting.  Genitourinary:  Negative for dysuria, flank pain, frequency, hematuria and urgency.  Musculoskeletal:  Negative for back pain, joint pain and myalgias.  Skin:  Negative for rash.  Neurological:  Negative for dizziness, tingling, focal weakness, seizures, weakness and headaches.  Endo/Heme/Allergies:  Does not bruise/bleed easily.  Psychiatric/Behavioral:  Negative for depression and suicidal ideas. The patient does not have insomnia.     Allergies  Allergen Reactions   Nirmatrelvir-Ritonavir Other (See Comments)    Increased blood sugar   Prednisone      Elevated blood sugar     Patient Active Problem List   Diagnosis Date Noted   Senile purpura (HCC) 11/13/2022   Neuropathy 04/10/2022   Atherosclerosis of aorta (HCC) 09/16/2019   Cirrhosis of liver without ascites (HCC) 09/16/2019   Renal cyst 09/16/2019   Osteoarthritis of knee 05/27/2018   Advanced care planning/counseling discussion 12/02/2017   Idiopathic thrombocytopenia (HCC) 02/10/2017   Bilateral leg edema 06/06/2016   Hypothyroidism 12/11/2015   Osteopenia 12/11/2015   Multiple respiratory allergies 12/11/2015   CAD (coronary artery disease) 07/05/2015   Type 2 diabetes mellitus with stage 3 chronic kidney disease (HCC) 07/05/2015   Benign hypertensive renal disease 07/05/2015   Secondary hyperparathyroidism of renal origin (HCC) 07/05/2015   Anemia of chronic renal failure 07/05/2015   Hyperlipidemia associated with type 2 diabetes mellitus (HCC) 03/27/2015   Bilateral carotid artery stenosis 02/09/2015   CKD (chronic kidney disease) stage 3, GFR 30-59 ml/min (HCC) 10/11/2014   Moderate mitral insufficiency 10/11/2014   Moderate tricuspid insufficiency 10/11/2014     Past Medical History:  Diagnosis Date   Arthritis    OSTEO OF KNEE  Breast cancer (HCC) 2000   left breast cancer, radiation tx's.   CAD (coronary artery disease)    Carotid artery stenosis    bilateral    Cataract    Chronic kidney disease    Chronic stage 3   Coronary heart disease    Diabetes mellitus without complication (HCC)    Edema of both legs    GERD (gastroesophageal reflux disease)    Glaucoma    Hyperlipemia    mixed   Hyperlipidemia    Hypertension    Hypothyroidism    Mitral insufficiency    moderate   Osteopenia    Osteopenia    Personal history of radiation therapy 2000   LEFT lumpectomy   Stenosis of carotid artery    Thrombocytopenia (HCC)    Tricuspid insufficiency    moderate     Past Surgical History:  Procedure Laterality Date   APPENDECTOMY     BLADDER SURGERY     BREAST EXCISIONAL BIOPSY Left 2000   breast ca   BREAST LUMPECTOMY Left    BREAST SURGERY     CARDIAC CATHETERIZATION     CARPAL TUNNEL RELEASE Bilateral    CHOLECYSTECTOMY     COLONOSCOPY     COLONOSCOPY WITH PROPOFOL  N/A 08/12/2018   Procedure: COLONOSCOPY WITH PROPOFOL ;  Surgeon: Viktoria Lamar DASEN, MD;  Location: [facility] END[facility]COPY;  Service: Endoscopy;  Laterality: N/A;   JOINT REPLACEMENT Right 2015   knee   parathyroid  surgery     SHOULDER SURGERY Right    TUBAL LIGATION      Social History   Socioeconomic History   Marital status: Married    Spouse name: Not on file   Number of children: Not on file   Years of education: Not on file   Highest education level: High school graduate  Occupational History   Occupation: retired  Tobacco Use   Smoking status: Never   Smokeless tobacco: Never  Vaping Use   Vaping status: Never Used  Substance and Sexual Activity   Alcohol use: No   Drug use: No   Sexual activity: Not Currently  Other Topics Concern   Not on file  Social History Narrative   Not on file   Social Drivers of Health   Financial Resource Strain: Low Risk  (02/11/2024)   Received from [facility]   Overall Financial Resource Strain (CARDIA)    Difficulty of Paying Living Expenses: Not hard at all  Food Insecurity: No Food Insecurity  (02/11/2024)   Received from [facility]   Hunger Vital Sign    Within the past 12 months, you worried that your food would run out before you got the money to buy more.: Never true    Within the past 12 months, the food you bought just didn't last and you didn't have money to get more.: Never true  Transportation Needs: No Transportation Needs (02/11/2024)   Received from [facility] - Transportation    In the past 12 months, has lack of transportation kept you from medical appointments or from getting medications?: No    Lack of Transportation (Non-Medical): No  Physical Activity: Sufficiently Active (05/13/2023)   Exercise Vital Sign    Days of Exercise per Week: 7 days    Minutes of Exercise per Session: 30 min  Stress: No Stress Concern Present (05/13/2023)   [employer] of Occupational [employer] - Occupational Stress Questionnaire    Feeling of Stress :  Not at all  Social Connections: Unknown (05/13/2023)   Social Connection and Isolation Panel    Frequency of Communication with Friends and Family: Twice a week    Frequency of Social Gatherings with Friends and Family: Not on file    Attends Religious Services: Never    Database administrator or Organizations: No    Attends Banker Meetings: Never    Marital Status: Married  Catering manager Violence: Not At Risk (05/13/2023)   Humiliation, Afraid, Rape, and Kick questionnaire    Fear of Current or Ex-Partner: No    Emotionally Abused: No    Physically Abused: No    Sexually Abused: No     Family History  Problem Relation Age of Onset   Gallbladder disease Mother    Aneurysm Mother    COPD Father    Congestive Heart Failure Father    Emphysema Father    Leukemia Sister    Stroke Brother    Dementia Brother    Alzheimer's disease Brother    Congestive Heart Failure Sister    Emphysema Sister    Breast cancer Neg Hx      Current Outpatient Medications:     albuterol  (VENTOLIN  HFA) 108 (90 Base) MCG/ACT inhaler, Inhale 2 puffs into the lungs every 6 (six) hours as needed for wheezing or shortness of breath., Disp: 8 g, Rfl: 6   amLODipine  (NORVASC ) 10 MG tablet, Take 1 tablet (10 mg total) by mouth every morning., Disp: 90 tablet, Rfl: 1   benazepril  (LOTENSIN ) 40 MG tablet, Take 1 tablet (40 mg total) by mouth daily., Disp: 90 tablet, Rfl: 1   Biotin w/ Vitamins C & E (HAIR/SKIN/NAILS PO), Take 1 tablet by mouth daily., Disp: , Rfl:    Blood Glucose Monitoring Suppl (ONE TOUCH ULTRA 2) w/Device KIT, 1 each by Does not apply route daily., Disp: 1 kit, Rfl: 0   brimonidine (ALPHAGAN) 0.2 % ophthalmic solution, 1 drop 2 (two) times daily., Disp: , Rfl:    carvedilol  (COREG ) 25 MG tablet, Take 1 tablet (25 mg total) by mouth 2 (two) times daily with a meal., Disp: 180 tablet, Rfl: 1   cloNIDine  (CATAPRES ) 0.1 MG tablet, TAKE 1 TABLET BY MOUTH IN THE MORNING AND 1 IN THE EVENING, Disp: 180 tablet, Rfl: 1   dorzolamide-timolol (COSOPT) 22.3-6.8 MG/ML ophthalmic solution, Place 1 drop into both eyes 2 (two) times daily. , Disp: , Rfl:    Dulaglutide  (TRULICITY ) 1.5 MG/0.5ML SOAJ, Inject 1.5 mg into the skin once a week., Disp: 6 mL, Rfl: 4   FARXIGA 10 MG TABS tablet, Take 10 mg by mouth every morning., Disp: , Rfl:    fexofenadine (ALLEGRA) 180 MG tablet, Take 180 mg by mouth daily., Disp: , Rfl:    fluticasone  (FLONASE ) 50 MCG/ACT nasal spray, Use 2 spray(s) in each nostril once daily, Disp: 48 g, Rfl: 12   furosemide  (LASIX ) 20 MG tablet, Take 1 tablet (20 mg total) by mouth daily., Disp: 90 tablet, Rfl: 1   gabapentin  (NEURONTIN ) 100 MG capsule, Take 1 capsule (100 mg total) by mouth at bedtime as needed., Disp: 90 capsule, Rfl: 1   latanoprost (XALATAN) 0.005 % ophthalmic solution, Place 1 drop into both eyes at bedtime. , Disp: , Rfl:    levothyroxine  (SYNTHROID ) 150 MCG tablet, Take 1 tablet (150 mcg total) by mouth daily before breakfast., Disp: 90  tablet, Rfl: 3   lovastatin  (MEVACOR ) 40 MG tablet, Take 1 tablet (40 mg total)  by mouth at bedtime., Disp: 90 tablet, Rfl: 1   montelukast  (SINGULAIR ) 10 MG tablet, Take 1 tablet (10 mg total) by mouth at bedtime., Disp: 90 tablet, Rfl: 3   OneTouch Delica Lancets 33G MISC, USE AS DIRECTED ONCE DAILY WITH TEST STRIPS E11.22, Disp: 100 each, Rfl: 12   ONETOUCH ULTRA test strip, USE TO CHECK BLOOD SUGAR AS NEEDED AS DIRECTED, Disp: 100 each, Rfl: 0   Physical exam:  Vitals:   06/21/24 1330 06/21/24 1341  BP: (!) 162/61 (!) 142/59  Pulse: 74 70  Resp: 18   Temp: (!) 97.3 F (36.3 C)   TempSrc: Tympanic   SpO2: 98%   Weight: 167 lb 12.8 oz (76.1 kg)   Height: 5' 1.5 (1.562 m)    Physical Exam Cardiovascular:     Rate and Rhythm: Normal rate and regular rhythm.     Heart sounds: Normal heart sounds.  Pulmonary:     Effort: Pulmonary effort is normal.     Breath sounds: Normal breath sounds.  Abdominal:     General: Bowel sounds are normal.     Palpations: Abdomen is soft.     Comments: No palpable hepatosplenomegaly  Skin:    General: Skin is warm and dry.  Neurological:     Mental Status: She is alert and oriented to person, place, and time.           Latest Ref Rng & Units 11/19/2023    9:18 AM  CMP  Glucose 70 - 99 mg/dL 797   BUN 8 - 27 mg/dL 23   Creatinine 9.42 - 1.00 mg/dL 8.47   Sodium 865 - 855 mmol/L 142   Potassium 3.5 - 5.2 mmol/L 4.2   Chloride 96 - 106 mmol/L 107   CO2 20 - 29 mmol/L 21   Calcium 8.7 - 10.3 mg/dL 9.5   Total Protein 6.0 - 8.5 g/dL 6.2   Total Bilirubin 0.0 - 1.2 mg/dL 0.5   Alkaline Phos 44 - 121 IU/L 57   AST 0 - 40 IU/L 21   ALT 0 - 32 IU/L 20       Latest Ref Rng & Units 11/19/2023    9:18 AM  CBC  WBC 3.4 - 10.8 x10E3/uL 4.3   Hemoglobin 11.1 - 15.9 g/dL 87.0   Hematocrit 65.9 - 46.6 % 40.3   Platelets 150 - 450 x10E3/uL 85      Assessment and plan- Patient is a 83 y.o. female referred for  thrombocytopenia  Thrombocytopenia likely chronic and her platelet counts have been stable between 70-80 since 2020 if not longer.  Patient had a CT abdomen and pelvis with contrast back in November 2020 which showed evidence of cirrhosis portal hypertension and mild splenomegaly.  I would like to get an ultrasound abdomen at this time to assess the status of her cirrhosis as well as splenomegaly.  It is very likely that her chronic thrombocytopenia secondary to cirrhosis.  I am checking CBC with differential smear review B12 and folate as well as AFP at this time.  I will see her back in 2 weeks time to discuss the results of blood work and ultrasound.  Overall her thrombocytopenia is mild to moderate and in the absence of other cytopenias it is unlikely that patient has a primary bone marrow disorder and does not require a bone marrow biopsy at this time   Thank you for this kind referral and the opportunity to participate in the care of this patient  Visit Diagnosis 1. Thrombocytopenia (HCC)   2. History of cirrhosis     Dr. Annah Skene, MD, MPH [facility] at [facility] [phone] 06/21/2024

## 2024-06-22 LAB — AFP TUMOR MARKER: AFP, Serum, Tumor Marker: 4.4 ng/mL (ref 0.0–8.7)

## 2024-06-28 ENCOUNTER — Ambulatory Visit
Admission: RE | Admit: 2024-06-28 | Discharge: 2024-06-28 | Disposition: A | Discharge status: Home / Self Care | Source: Ambulatory Visit | Attending: Oncology | Admitting: Oncology

## 2024-06-28 DIAGNOSIS — K7460 Unspecified cirrhosis of liver: Secondary | ICD-10-CM | POA: Diagnosis not present

## 2024-06-28 DIAGNOSIS — K760 Fatty (change of) liver, not elsewhere classified: Secondary | ICD-10-CM | POA: Diagnosis not present

## 2024-06-28 DIAGNOSIS — N281 Cyst of kidney, acquired: Secondary | ICD-10-CM | POA: Diagnosis not present

## 2024-06-28 DIAGNOSIS — R188 Other ascites: Secondary | ICD-10-CM | POA: Diagnosis not present

## 2024-06-28 DIAGNOSIS — Z8719 Personal history of other diseases of the digestive system: Secondary | ICD-10-CM | POA: Insufficient documentation

## 2024-07-05 ENCOUNTER — Encounter: Payer: Self-pay | Admitting: Oncology

## 2024-07-05 ENCOUNTER — Inpatient Hospital Stay (HOSPITAL_BASED_OUTPATIENT_CLINIC_OR_DEPARTMENT_OTHER): Admitting: Oncology

## 2024-07-05 VITALS — BP 159/75 | HR 69 | Temp 97.7°F | Resp 18 | Ht 61.5 in | Wt 165.9 lb

## 2024-07-05 DIAGNOSIS — D696 Thrombocytopenia, unspecified: Secondary | ICD-10-CM | POA: Diagnosis not present

## 2024-07-05 NOTE — Progress Notes (Signed)
 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  Most recent CBC from 06/02/2024 showed white count of 3.7, H&H of 13.2/40.8 with an MCV of 91.1 and a platelet count of 80.  Of note patient has had chronic thrombocytopenia with a platelet count that has remained stable between 70-80 at least dating back to 2020.  White count and hemoglobin have always been normal.  Of note patient has history of cirrhosis.  She had a CT abdomen back in November 2020 which showed marked hepatic nodularity with signs of portal hypertension and including recanalized umbilical vein. She has not seen GI for her cirrhosis.  LFTs from 06/02/2024 were normal with an albumin of 3.9    Interval history-she is doing well presently.  She does report fatigue but denies any new complaints  ECOG PS- 1 Pain scale- 0   Review of systems- Review of Systems  Constitutional:  Positive for malaise/fatigue. Negative for chills, fever and weight loss.  HENT:  Negative for congestion, ear discharge and nosebleeds.   Eyes:  Negative for blurred vision.  Respiratory:  Negative for cough, hemoptysis, sputum production, shortness of breath and wheezing.   Cardiovascular:  Negative for chest pain, palpitations, orthopnea and claudication.  Gastrointestinal:  Negative for abdominal pain, blood in  stool, constipation, diarrhea, heartburn, melena, nausea and vomiting.  Genitourinary:  Negative for dysuria, flank pain, frequency, hematuria and urgency.  Musculoskeletal:  Negative for back pain, joint pain and myalgias.  Skin:  Negative for rash.  Neurological:  Negative for dizziness, tingling, focal weakness, seizures, weakness and headaches.  Endo/Heme/Allergies:  Does not bruise/bleed easily.  Psychiatric/Behavioral:  Negative for depression and suicidal ideas. The patient does not have insomnia.       Allergies  Allergen Reactions   Nirmatrelvir-Ritonavir Other (See Comments)    Increased blood sugar   Prednisone      Elevated blood sugar      Past Medical History:  Diagnosis Date   Arthritis    OSTEO OF KNEE   Breast cancer (HCC) 2000   left breast cancer, radiation tx's.   CAD (coronary artery disease)    Carotid artery stenosis    bilateral   Cataract    Chronic kidney disease    Chronic stage 3   Coronary heart disease    Diabetes mellitus without complication (HCC)    Edema of both legs    GERD (gastroesophageal reflux disease)    Glaucoma    Hyperlipemia    mixed   Hyperlipidemia    Hypertension    Hypothyroidism    Mitral insufficiency    moderate   Osteopenia    Osteopenia    Personal history of radiation therapy 2000   LEFT lumpectomy   Stenosis of carotid artery    Thrombocytopenia    Tricuspid insufficiency    moderate     Past Surgical History:  Procedure Laterality Date   APPENDECTOMY     BLADDER SURGERY  BREAST EXCISIONAL BIOPSY Left 2000   breast ca   BREAST LUMPECTOMY Left    BREAST SURGERY     CARDIAC CATHETERIZATION     CARPAL TUNNEL RELEASE Bilateral    CHOLECYSTECTOMY     COLONOSCOPY     COLONOSCOPY WITH PROPOFOL  N/A 08/12/2018   Procedure: COLONOSCOPY WITH PROPOFOL ;  Surgeon: Viktoria Lamar DASEN, MD;  Location: [facility] ENDOSCOPY;  Service: Endoscopy;  Laterality: N/A;   JOINT REPLACEMENT Right 2015   knee   parathyroid   surgery     SHOULDER SURGERY Right    TUBAL LIGATION      Social History   Socioeconomic History   Marital status: Married    Spouse name: Not on file   Number of children: Not on file   Years of education: Not on file   Highest education level: High school graduate  Occupational History   Occupation: retired  Tobacco Use   Smoking status: Never   Smokeless tobacco: Never  Vaping Use   Vaping status: Never Used  Substance and Sexual Activity   Alcohol use: No   Drug use: No   Sexual activity: Not Currently  Other Topics Concern   Not on file  Social History Narrative   Not on file   Social Drivers of Health   Financial Resource Strain: Low Risk  (02/11/2024)   Received from [facility]   Overall Financial Resource Strain (CARDIA)    Difficulty of Paying Living Expenses: Not hard at all  Food Insecurity: No Food Insecurity (06/21/2024)   Hunger Vital Sign    Worried About Running Out of Food in the Last Year: Never true    Ran Out of Food in the Last Year: Never true  Transportation Needs: No Transportation Needs (06/21/2024)   PRAPARE - Administrator, Civil Service (Medical): No    Lack of Transportation (Non-Medical): No  Physical Activity: Sufficiently Active (05/13/2023)   Exercise Vital Sign    Days of Exercise per Week: 7 days    Minutes of Exercise per Session: 30 min  Stress: No Stress Concern Present (05/13/2023)   [employer] of Occupational Health - Occupational Stress Questionnaire    Feeling of Stress : Not at all  Social Connections: Unknown (05/13/2023)   Social Connection and Isolation Panel    Frequency of Communication with Friends and Family: Twice a week    Frequency of Social Gatherings with Friends and Family: Not on file    Attends Religious Services: Never    Database administrator or Organizations: No    Attends Banker Meetings: Never    Marital Status: Married  Catering manager Violence: Not  At Risk (06/21/2024)   Humiliation, Afraid, Rape, and Kick questionnaire    Fear of Current or Ex-Partner: No    Emotionally Abused: No    Physically Abused: No    Sexually Abused: No    Family History  Problem Relation Age of Onset   Gallbladder disease Mother    Aneurysm Mother    COPD Father    Congestive Heart Failure Father    Emphysema Father    Leukemia Sister    Congestive Heart Failure Sister    Emphysema Sister    Stroke Brother    Dementia Brother    Alzheimer's disease Brother    Breast cancer Neg Hx      Current Outpatient Medications:    amLODipine  (NORVASC ) 10 MG tablet, Take 1 tablet (10 mg total) by  mouth every morning., Disp: 90 tablet, Rfl: 1   benazepril  (LOTENSIN ) 40 MG tablet, Take 1 tablet (40 mg total) by mouth daily., Disp: 90 tablet, Rfl: 1   Biotin w/ Vitamins C & E (HAIR/SKIN/NAILS PO), Take 1 tablet by mouth daily., Disp: , Rfl:    Blood Glucose Monitoring Suppl (ONE TOUCH ULTRA 2) w/Device KIT, 1 each by Does not apply route daily., Disp: 1 kit, Rfl: 0   brimonidine (ALPHAGAN) 0.2 % ophthalmic solution, 1 drop 2 (two) times daily., Disp: , Rfl:    carvedilol  (COREG ) 25 MG tablet, Take 1 tablet (25 mg total) by mouth 2 (two) times daily with a meal., Disp: 180 tablet, Rfl: 1   dorzolamide-timolol (COSOPT) 22.3-6.8 MG/ML ophthalmic solution, Place 1 drop into both eyes 2 (two) times daily. , Disp: , Rfl:    Dulaglutide  (TRULICITY ) 1.5 MG/0.5ML SOAJ, Inject 1.5 mg into the skin once a week., Disp: 6 mL, Rfl: 4   FARXIGA 10 MG TABS tablet, Take 10 mg by mouth every morning., Disp: , Rfl:    fluticasone  (FLONASE ) 50 MCG/ACT nasal spray, Use 2 spray(s) in each nostril once daily, Disp: 48 g, Rfl: 12   furosemide  (LASIX ) 20 MG tablet, Take 1 tablet (20 mg total) by mouth daily., Disp: 90 tablet, Rfl: 1   latanoprost (XALATAN) 0.005 % ophthalmic solution, Place 1 drop into both eyes at bedtime. , Disp: , Rfl:    levothyroxine  (SYNTHROID ) 150 MCG tablet, Take  1 tablet (150 mcg total) by mouth daily before breakfast., Disp: 90 tablet, Rfl: 3   lovastatin  (MEVACOR ) 40 MG tablet, Take 1 tablet (40 mg total) by mouth at bedtime., Disp: 90 tablet, Rfl: 1   OneTouch Delica Lancets 33G MISC, USE AS DIRECTED ONCE DAILY WITH TEST STRIPS E11.22, Disp: 100 each, Rfl: 12   ONETOUCH ULTRA test strip, USE TO CHECK BLOOD SUGAR AS NEEDED AS DIRECTED, Disp: 100 each, Rfl: 0   albuterol  (VENTOLIN  HFA) 108 (90 Base) MCG/ACT inhaler, Inhale 2 puffs into the lungs every 6 (six) hours as needed for wheezing or shortness of breath. (Patient not taking: Reported on 07/05/2024), Disp: 8 g, Rfl: 6   cloNIDine  (CATAPRES ) 0.1 MG tablet, TAKE 1 TABLET BY MOUTH IN THE MORNING AND 1 IN THE EVENING (Patient not taking: Reported on 07/05/2024), Disp: 180 tablet, Rfl: 1   fexofenadine (ALLEGRA) 180 MG tablet, Take 180 mg by mouth daily., Disp: , Rfl:    gabapentin  (NEURONTIN ) 100 MG capsule, Take 1 capsule (100 mg total) by mouth at bedtime as needed. (Patient not taking: Reported on 07/05/2024), Disp: 90 capsule, Rfl: 1   montelukast  (SINGULAIR ) 10 MG tablet, Take 1 tablet (10 mg total) by mouth at bedtime. (Patient not taking: Reported on 07/05/2024), Disp: 90 tablet, Rfl: 3  Physical exam:  Vitals:   07/05/24 1418  BP: (!) 159/75  Pulse: 69  Resp: 18  Temp: 97.7 F (36.5 C)  TempSrc: Tympanic  SpO2: 98%  Weight: 165 lb 14.4 oz (75.3 kg)  Height: 5' 1.5 (1.562 m)   Physical Exam Cardiovascular:     Rate and Rhythm: Normal rate and regular rhythm.     Heart sounds: Normal heart sounds.  Pulmonary:     Effort: Pulmonary effort is normal.     Breath sounds: Normal breath sounds.  Skin:    General: Skin is warm and dry.  Neurological:     Mental Status: She is alert and oriented to person, place, and time.  I have personally reviewed labs listed below:    Latest Ref Rng & Units 11/19/2023    9:18 AM  CMP  Glucose 70 - 99 mg/dL 797   BUN 8 - 27 mg/dL 23    Creatinine 9.42 - 1.00 mg/dL 8.47   Sodium 865 - 855 mmol/L 142   Potassium 3.5 - 5.2 mmol/L 4.2   Chloride 96 - 106 mmol/L 107   CO2 20 - 29 mmol/L 21   Calcium 8.7 - 10.3 mg/dL 9.5   Total Protein 6.0 - 8.5 g/dL 6.2   Total Bilirubin 0.0 - 1.2 mg/dL 0.5   Alkaline Phos 44 - 121 IU/L 57   AST 0 - 40 IU/L 21   ALT 0 - 32 IU/L 20       Latest Ref Rng & Units 06/21/2024    2:02 PM  CBC  WBC 4.0 - 10.5 K/uL 4.8   Hemoglobin 12.0 - 15.0 g/dL 87.1   Hematocrit 63.9 - 46.0 % 40.0   Platelets 150 - 400 K/uL 76    I have personally reviewed Radiology images listed below: No images are attached to the encounter.  US  Abdomen Complete Result Date: 07/03/2024 CLINICAL DATA:  Long history of cirrhosis of the liver. Prior cholecystectomy. EXAM: ABDOMEN ULTRASOUND COMPLETE COMPARISON:  CT with IV contrast 09/02/2019. FINDINGS: Gallbladder: Surgically absent. Common bile duct: Diameter: Prominent measuring 8.5 mm, was previously 7.1 mm, probably due to prior cholecystectomy. Laboratory correlation suggested. There is no intrahepatic bile duct dilatation. Liver: No focal lesion identified. There is a cirrhotic liver configuration again noted with capsular nodularity. Mild increased echogenicity is seen consistent with a degree of steatosis, which was also seen previously. Portal vein is patent on color Doppler imaging with normal direction of blood flow towards the liver. The portal vein measures slightly prominent 14.3 mm. IVC: No abnormality visualized. Pancreas: Visualized portion unremarkable. Spleen: Size and appearance within normal limits. Maximum length 11.4 cm. Right Kidney: Length: 10.5 cm. Echogenicity within normal limits. No mass or hydronephrosis visualized. There is a 2.0 cm parapelvic interpolar simple cyst. Left Kidney: Length: Smaller with patchy cortical scarring, length is 9.2 cm. Asymmetric cortical thinning also. Echogenicity is within normal limits. No mass or hydronephrosis  visualized. There is a 5 cm simple cyst of the mid to lower pole which was previously 4.2 cm. Abdominal aorta: No aneurysm visualized. Other findings: There is trace ascites in all 4 abdominal quadrants. No drainable pocket is seen. IMPRESSION: 1. Cirrhotic liver configuration with mild steatosis. No focal liver lesion is seen. 2. Trace ascites in all 4 abdominal quadrants. No drainable pocket. 3. Prominent common bile duct measuring 8.5 mm, previously 7.1 mm, probably due to prior cholecystectomy. Laboratory correlation suggested. 4. Bilateral renal cysts. 5. Asymmetric cortical thinning and scarring of the left kidney. Electronically Signed   By: Francis Quam M.D.   On: 07/03/2024 01:43     Assessment and plan- Patient is a 83 y.o. 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  She has had chronic mild thrombocytopenia with a platelet count that fluctuates between 70-100 without a clear downward trend.  This may be secondary to cirrhosis versus a component of ITP.  Overall her counts have remained stable and her thrombocytopenia dates back to at least last 10 years.  As such patient does not  require any follow-up with hematology at this time.  She can be referred to us  if her platelet counts drop down to less than 50  With regards to cirrhosis: Patient has normal LFTs and albumin but she can be referred to [facility] GI by her PCP if need be   Visit Diagnosis 1. Thrombocytopenia      Dr. Annah Skene, MD, MPH [facility] [phone] 07/05/2024 2:34 PM

## 2024-08-01 ENCOUNTER — Other Ambulatory Visit: Payer: Self-pay | Admitting: Family Medicine

## 2024-08-01 DIAGNOSIS — E039 Hypothyroidism, unspecified: Secondary | ICD-10-CM

## 2024-08-03 NOTE — Telephone Encounter (Signed)
 Requested medications are due for refill today.  A little too soon  Requested medications are on the active medications list.  yes  Last refill. 08/20/2023 #90 3 rf  Future visit scheduled.   no  Notes to clinic.  Dr. Alla list as pcp.    Requested Prescriptions  Pending Prescriptions Disp Refills   levothyroxine  (SYNTHROID ) 150 MCG tablet [Pharmacy Med Name: Levothyroxine  Sodium 150 MCG Oral Tablet] 90 tablet 0    Sig: TAKE 1 TABLET BY MOUTH ONCE DAILY BEFORE  BREAKFAST     Endocrinology:  Hypothyroid Agents Passed - 08/03/2024 12:51 PM      Passed - TSH in normal range and within 360 days    TSH  Date Value Ref Range Status  11/19/2023 2.600 0.450 - 4.500 uIU/mL Final         Passed - Valid encounter within last 12 months    Recent Outpatient Visits           8 months ago Routine general medical examination at a health care facility   [facility], Megan P, DO

## 2024-08-11 NOTE — Progress Notes (Signed)
 Julia Nielsen                                          MRN: [id number]   08/11/2024   The [facility] Quality Team Specialist reviewed this patient medical record for the purposes of chart review for care gap closure. The following were reviewed: chart review for care gap closure-controlling blood pressure.    [facility]BCI Quality Team

## 2024-08-12 ENCOUNTER — Other Ambulatory Visit: Payer: Self-pay | Admitting: Family Medicine

## 2024-08-12 DIAGNOSIS — E039 Hypothyroidism, unspecified: Secondary | ICD-10-CM

## 2024-08-13 NOTE — Telephone Encounter (Signed)
 Duplicate request.  Requested Prescriptions  Pending Prescriptions Disp Refills   levothyroxine  (SYNTHROID ) 150 MCG tablet [Pharmacy Med Name: Levothyroxine  Sodium 150 MCG Oral Tablet] 90 tablet 0    Sig: TAKE 1 TABLET BY MOUTH ONCE DAILY BEFORE  BREAKFAST     Endocrinology:  Hypothyroid Agents Passed - 08/13/2024  4:06 PM      Passed - TSH in normal range and within 360 days    TSH  Date Value Ref Range Status  11/19/2023 2.600 0.450 - 4.500 uIU/mL Final         Passed - Valid encounter within last 12 months    Recent Outpatient Visits           8 months ago Routine general medical examination at a health care facility   [facility], [place] P, DO

## 2024-08-14 ENCOUNTER — Other Ambulatory Visit: Payer: Self-pay | Admitting: Family Medicine

## 2024-08-14 DIAGNOSIS — E039 Hypothyroidism, unspecified: Secondary | ICD-10-CM

## 2024-08-17 NOTE — Telephone Encounter (Signed)
 Duplicate request.  Requested Prescriptions  Pending Prescriptions Disp Refills   levothyroxine  (SYNTHROID ) 150 MCG tablet [Pharmacy Med Name: Levothyroxine  Sodium 150 MCG Oral Tablet] 90 tablet 0    Sig: TAKE 1 TABLET BY MOUTH ONCE DAILY BEFORE  BREAKFAST     Endocrinology:  Hypothyroid Agents Passed - 08/17/2024 10:41 AM      Passed - TSH in normal range and within 360 days    TSH  Date Value Ref Range Status  11/19/2023 2.600 0.450 - 4.500 uIU/mL Final         Passed - Valid encounter within last 12 months    Recent Outpatient Visits           9 months ago Routine general medical examination at a health care facility   [facility], Megan P, DO

## 2024-08-18 DIAGNOSIS — D631 Anemia in chronic kidney disease: Secondary | ICD-10-CM | POA: Diagnosis not present

## 2024-08-18 DIAGNOSIS — R809 Proteinuria, unspecified: Secondary | ICD-10-CM | POA: Diagnosis not present

## 2024-08-18 DIAGNOSIS — E1122 Type 2 diabetes mellitus with diabetic chronic kidney disease: Secondary | ICD-10-CM | POA: Diagnosis not present

## 2024-08-18 DIAGNOSIS — N2581 Secondary hyperparathyroidism of renal origin: Secondary | ICD-10-CM | POA: Diagnosis not present

## 2024-08-18 DIAGNOSIS — N1832 Chronic kidney disease, stage 3b: Secondary | ICD-10-CM | POA: Diagnosis not present

## 2024-08-18 DIAGNOSIS — I129 Hypertensive chronic kidney disease with stage 1 through stage 4 chronic kidney disease, or unspecified chronic kidney disease: Secondary | ICD-10-CM | POA: Diagnosis not present

## 2024-08-18 DIAGNOSIS — E876 Hypokalemia: Secondary | ICD-10-CM | POA: Diagnosis not present

## 2024-08-19 ENCOUNTER — Other Ambulatory Visit: Payer: Self-pay | Admitting: Family Medicine

## 2024-08-19 DIAGNOSIS — Z1231 Encounter for screening mammogram for malignant neoplasm of breast: Secondary | ICD-10-CM

## 2024-08-25 DIAGNOSIS — D696 Thrombocytopenia, unspecified: Secondary | ICD-10-CM | POA: Diagnosis not present

## 2024-08-25 DIAGNOSIS — N2581 Secondary hyperparathyroidism of renal origin: Secondary | ICD-10-CM | POA: Diagnosis not present

## 2024-08-25 DIAGNOSIS — D631 Anemia in chronic kidney disease: Secondary | ICD-10-CM | POA: Diagnosis not present

## 2024-08-25 DIAGNOSIS — N1832 Chronic kidney disease, stage 3b: Secondary | ICD-10-CM | POA: Diagnosis not present

## 2024-08-25 DIAGNOSIS — I129 Hypertensive chronic kidney disease with stage 1 through stage 4 chronic kidney disease, or unspecified chronic kidney disease: Secondary | ICD-10-CM | POA: Diagnosis not present

## 2024-08-25 DIAGNOSIS — R809 Proteinuria, unspecified: Secondary | ICD-10-CM | POA: Diagnosis not present

## 2024-08-25 DIAGNOSIS — E1122 Type 2 diabetes mellitus with diabetic chronic kidney disease: Secondary | ICD-10-CM | POA: Diagnosis not present

## 2024-08-31 ENCOUNTER — Telehealth: Payer: Self-pay

## 2024-08-31 NOTE — Telephone Encounter (Signed)
 Gave pt a call she is coming up due for re-enrollment on [employer] (Trulicity ) spoke with pt explain she is seen a new provider is not a [facility] provider and will be going to the new provider for any medication assistance.

## 2024-09-21 ENCOUNTER — Ambulatory Visit
Admission: RE | Admit: 2024-09-21 | Discharge: 2024-09-21 | Disposition: A | Source: Ambulatory Visit | Attending: Family Medicine | Admitting: Family Medicine

## 2024-09-21 DIAGNOSIS — Z1231 Encounter for screening mammogram for malignant neoplasm of breast: Secondary | ICD-10-CM

## 2024-11-08 ENCOUNTER — Other Ambulatory Visit: Payer: Self-pay | Admitting: Family Medicine

## 2024-11-09 NOTE — Telephone Encounter (Signed)
 Requested medication (s) are due for refill today - yes  Requested medication (s) are on the active medication list -yes  Future visit scheduled -no  Last refill: 11/19/23 6ml 4RF  Notes to clinic: Patient may no longer be under provider care- sent for review , no protocol listed  Requested Prescriptions  Pending Prescriptions Disp Refills   TRULICITY  1.5 MG/0.5ML SOAJ [Pharmacy Med Name: Trulicity  Subcutaneous Solution Auto-injector 1.5 MG/0.5ML] 6 mL 0    Sig: INJECT 1.5 MG (0.5ML) UNDER THE SKIN ONCE A WEEK     There is no refill protocol information for this order       Requested Prescriptions  Pending Prescriptions Disp Refills   TRULICITY  1.5 MG/0.5ML SOAJ [Pharmacy Med Name: Trulicity  Subcutaneous Solution Auto-injector 1.5 MG/0.5ML] 6 mL 0    Sig: INJECT 1.5 MG (0.5ML) UNDER THE SKIN ONCE A WEEK     There is no refill protocol information for this order
